# Patient Record
Sex: Female | Born: 1960 | Race: White | Hispanic: No | Marital: Married | Smoking: Never smoker
Health system: Southern US, Community
[De-identification: ages and names within clinical notes are randomized; demographics above are authoritative.]

## PROBLEM LIST (undated history)

## (undated) DIAGNOSIS — D649 Anemia, unspecified: Secondary | ICD-10-CM

## (undated) DIAGNOSIS — H04129 Dry eye syndrome of unspecified lacrimal gland: Secondary | ICD-10-CM

## (undated) DIAGNOSIS — G4730 Sleep apnea, unspecified: Secondary | ICD-10-CM

## (undated) DIAGNOSIS — D631 Anemia in chronic kidney disease: Secondary | ICD-10-CM

## (undated) DIAGNOSIS — E109 Type 1 diabetes mellitus without complications: Secondary | ICD-10-CM

## (undated) DIAGNOSIS — K5190 Ulcerative colitis, unspecified, without complications: Secondary | ICD-10-CM

## (undated) DIAGNOSIS — K760 Fatty (change of) liver, not elsewhere classified: Secondary | ICD-10-CM

## (undated) DIAGNOSIS — I10 Essential (primary) hypertension: Secondary | ICD-10-CM

## (undated) DIAGNOSIS — C50919 Malignant neoplasm of unspecified site of unspecified female breast: Secondary | ICD-10-CM

## (undated) DIAGNOSIS — L509 Urticaria, unspecified: Secondary | ICD-10-CM

## (undated) DIAGNOSIS — E785 Hyperlipidemia, unspecified: Secondary | ICD-10-CM

## (undated) HISTORY — PX: OTHER SURGICAL HISTORY: SHX169

## (undated) HISTORY — DX: Urticaria, unspecified: L50.9

## (undated) HISTORY — PX: CARPAL TUNNEL RELEASE: SHX101

## (undated) HISTORY — DX: Essential (primary) hypertension: I10

## (undated) HISTORY — DX: Sleep apnea, unspecified: G47.30

## (undated) HISTORY — DX: Anemia, unspecified: D64.9

## (undated) HISTORY — DX: Type 1 diabetes mellitus without complications: E10.9

## (undated) HISTORY — DX: Ulcerative colitis, unspecified, without complications: K51.90

## (undated) HISTORY — DX: Fatty (change of) liver, not elsewhere classified: K76.0

## (undated) HISTORY — DX: Dry eye syndrome of unspecified lacrimal gland: H04.129

## (undated) HISTORY — PX: APPENDECTOMY: SHX54

## (undated) HISTORY — DX: Malignant neoplasm of unspecified site of unspecified female breast: C50.919

## (undated) HISTORY — DX: Anemia in chronic kidney disease: D63.1

## (undated) HISTORY — DX: Hyperlipidemia, unspecified: E78.5

---
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
## 1991-09-02 HISTORY — PX: CHOLECYSTECTOMY: SHX55

## 1994-02-10 HISTORY — PX: NASAL SINUS SURGERY: SHX719

## 2004-09-23 ENCOUNTER — Encounter: Admission: RE | Admit: 2004-09-23 | Discharge: 2004-09-23 | Payer: Self-pay | Admitting: Gastroenterology

## 2004-10-03 ENCOUNTER — Ambulatory Visit (HOSPITAL_COMMUNITY): Admission: RE | Admit: 2004-10-03 | Discharge: 2004-10-03 | Payer: Self-pay | Admitting: Gastroenterology

## 2004-10-03 ENCOUNTER — Encounter (INDEPENDENT_AMBULATORY_CARE_PROVIDER_SITE_OTHER): Payer: Self-pay | Admitting: Specialist

## 2005-09-01 HISTORY — PX: OTHER SURGICAL HISTORY: SHX169

## 2005-12-08 HISTORY — PX: PROTOCOLECTOMY: SHX1034

## 2005-12-22 ENCOUNTER — Inpatient Hospital Stay (HOSPITAL_COMMUNITY): Admission: EM | Admit: 2005-12-22 | Discharge: 2005-12-25 | Payer: Self-pay | Admitting: Emergency Medicine

## 2006-06-17 HISTORY — PX: ILEOSTOMY: SHX1783

## 2007-02-03 ENCOUNTER — Ambulatory Visit: Payer: Self-pay | Admitting: Endocrinology

## 2007-02-05 ENCOUNTER — Ambulatory Visit: Payer: Self-pay | Admitting: Endocrinology

## 2007-02-11 ENCOUNTER — Ambulatory Visit: Payer: Self-pay | Admitting: Endocrinology

## 2007-02-24 ENCOUNTER — Ambulatory Visit: Payer: Self-pay | Admitting: Endocrinology

## 2007-03-17 ENCOUNTER — Ambulatory Visit: Payer: Self-pay | Admitting: Endocrinology

## 2007-04-14 ENCOUNTER — Encounter: Payer: Self-pay | Admitting: Endocrinology

## 2007-04-14 DIAGNOSIS — I10 Essential (primary) hypertension: Secondary | ICD-10-CM | POA: Insufficient documentation

## 2007-04-15 ENCOUNTER — Ambulatory Visit: Payer: Self-pay | Admitting: Endocrinology

## 2007-04-15 LAB — CONVERTED CEMR LAB: Hgb A1c MFr Bld: 7.3 % — ABNORMAL HIGH (ref 4.6–6.0)

## 2007-06-16 ENCOUNTER — Encounter: Payer: Self-pay | Admitting: Endocrinology

## 2007-06-16 ENCOUNTER — Ambulatory Visit: Payer: Self-pay | Admitting: Endocrinology

## 2007-06-29 ENCOUNTER — Encounter: Payer: Self-pay | Admitting: Endocrinology

## 2007-07-02 ENCOUNTER — Ambulatory Visit: Payer: Self-pay | Admitting: Endocrinology

## 2007-07-06 ENCOUNTER — Telehealth: Payer: Self-pay | Admitting: Endocrinology

## 2007-07-08 ENCOUNTER — Telehealth (INDEPENDENT_AMBULATORY_CARE_PROVIDER_SITE_OTHER): Payer: Self-pay | Admitting: *Deleted

## 2007-07-16 ENCOUNTER — Ambulatory Visit: Payer: Self-pay | Admitting: Endocrinology

## 2007-08-12 ENCOUNTER — Encounter: Payer: Self-pay | Admitting: Endocrinology

## 2007-09-06 ENCOUNTER — Ambulatory Visit: Payer: Self-pay | Admitting: Endocrinology

## 2007-09-06 LAB — CONVERTED CEMR LAB: Hgb A1c MFr Bld: 7.6 % — ABNORMAL HIGH (ref 4.6–6.0)

## 2007-09-08 ENCOUNTER — Encounter: Payer: Self-pay | Admitting: Endocrinology

## 2007-10-27 ENCOUNTER — Ambulatory Visit: Payer: Self-pay | Admitting: Endocrinology

## 2007-12-06 ENCOUNTER — Ambulatory Visit: Payer: Self-pay | Admitting: Endocrinology

## 2007-12-20 ENCOUNTER — Encounter: Payer: Self-pay | Admitting: Endocrinology

## 2008-01-11 ENCOUNTER — Encounter: Payer: Self-pay | Admitting: Endocrinology

## 2008-03-06 ENCOUNTER — Ambulatory Visit: Payer: Self-pay | Admitting: Endocrinology

## 2008-03-06 DIAGNOSIS — K7689 Other specified diseases of liver: Secondary | ICD-10-CM | POA: Insufficient documentation

## 2008-03-06 DIAGNOSIS — K5190 Ulcerative colitis, unspecified, without complications: Secondary | ICD-10-CM | POA: Insufficient documentation

## 2008-03-21 ENCOUNTER — Ambulatory Visit: Payer: Self-pay | Admitting: Endocrinology

## 2008-04-26 ENCOUNTER — Ambulatory Visit: Payer: Self-pay | Admitting: Endocrinology

## 2008-05-23 ENCOUNTER — Encounter: Payer: Self-pay | Admitting: Endocrinology

## 2008-08-01 ENCOUNTER — Ambulatory Visit: Payer: Self-pay | Admitting: Endocrinology

## 2008-08-01 LAB — CONVERTED CEMR LAB: Hgb A1c MFr Bld: 7.6 % — ABNORMAL HIGH (ref 4.6–6.0)

## 2008-09-25 ENCOUNTER — Ambulatory Visit: Payer: Self-pay | Admitting: Endocrinology

## 2008-11-16 ENCOUNTER — Encounter: Payer: Self-pay | Admitting: Endocrinology

## 2008-11-21 ENCOUNTER — Ambulatory Visit: Payer: Self-pay | Admitting: Endocrinology

## 2008-11-21 DIAGNOSIS — N259 Disorder resulting from impaired renal tubular function, unspecified: Secondary | ICD-10-CM | POA: Insufficient documentation

## 2008-11-21 DIAGNOSIS — E785 Hyperlipidemia, unspecified: Secondary | ICD-10-CM | POA: Insufficient documentation

## 2008-12-18 ENCOUNTER — Encounter: Payer: Self-pay | Admitting: Endocrinology

## 2008-12-20 ENCOUNTER — Telehealth: Payer: Self-pay | Admitting: Endocrinology

## 2009-01-02 ENCOUNTER — Ambulatory Visit: Payer: Self-pay | Admitting: Endocrinology

## 2009-01-02 DIAGNOSIS — R209 Unspecified disturbances of skin sensation: Secondary | ICD-10-CM | POA: Insufficient documentation

## 2009-01-02 LAB — CONVERTED CEMR LAB: Vitamin B-12: 489 pg/mL (ref 211–911)

## 2009-02-19 ENCOUNTER — Ambulatory Visit: Payer: Self-pay | Admitting: Endocrinology

## 2009-04-10 ENCOUNTER — Ambulatory Visit: Payer: Self-pay | Admitting: Endocrinology

## 2009-04-10 DIAGNOSIS — R809 Proteinuria, unspecified: Secondary | ICD-10-CM | POA: Insufficient documentation

## 2009-04-10 LAB — CONVERTED CEMR LAB: Hgb A1c MFr Bld: 7.8 % — ABNORMAL HIGH (ref 4.6–6.5)

## 2009-06-12 ENCOUNTER — Encounter: Payer: Self-pay | Admitting: Endocrinology

## 2009-07-10 ENCOUNTER — Ambulatory Visit: Payer: Self-pay | Admitting: Endocrinology

## 2009-07-10 LAB — CONVERTED CEMR LAB: Hgb A1c MFr Bld: 7.9 % — ABNORMAL HIGH (ref 4.6–6.5)

## 2009-07-25 ENCOUNTER — Telehealth (INDEPENDENT_AMBULATORY_CARE_PROVIDER_SITE_OTHER): Payer: Self-pay | Admitting: *Deleted

## 2009-08-01 ENCOUNTER — Encounter: Payer: Self-pay | Admitting: Endocrinology

## 2009-08-13 ENCOUNTER — Telehealth: Payer: Self-pay | Admitting: Endocrinology

## 2009-10-09 ENCOUNTER — Ambulatory Visit: Payer: Self-pay | Admitting: Endocrinology

## 2009-10-09 LAB — CONVERTED CEMR LAB: Hgb A1c MFr Bld: 7.4 % — ABNORMAL HIGH (ref 4.6–6.5)

## 2010-01-07 ENCOUNTER — Ambulatory Visit: Payer: Self-pay | Admitting: Endocrinology

## 2010-01-07 LAB — CONVERTED CEMR LAB
Folate: >20 ng/mL
Hgb A1c MFr Bld: 8.1 % — ABNORMAL HIGH (ref 4.6–6.5)
Vitamin B-12: 337 pg/mL (ref 211–911)

## 2010-01-14 ENCOUNTER — Telehealth: Payer: Self-pay | Admitting: Endocrinology

## 2010-02-08 ENCOUNTER — Encounter: Payer: Self-pay | Admitting: Endocrinology

## 2010-04-05 ENCOUNTER — Telehealth: Payer: Self-pay | Admitting: Endocrinology

## 2010-04-09 ENCOUNTER — Ambulatory Visit: Payer: Self-pay | Admitting: Endocrinology

## 2010-05-16 ENCOUNTER — Encounter: Payer: Self-pay | Admitting: Endocrinology

## 2010-05-16 ENCOUNTER — Telehealth: Payer: Self-pay | Admitting: Endocrinology

## 2010-07-09 ENCOUNTER — Ambulatory Visit: Payer: Self-pay | Admitting: Endocrinology

## 2010-07-09 DIAGNOSIS — R05 Cough: Secondary | ICD-10-CM

## 2010-07-09 DIAGNOSIS — R059 Cough, unspecified: Secondary | ICD-10-CM | POA: Insufficient documentation

## 2010-07-18 ENCOUNTER — Encounter: Payer: Self-pay | Admitting: Endocrinology

## 2010-07-19 ENCOUNTER — Emergency Department (HOSPITAL_COMMUNITY): Admission: EM | Admit: 2010-07-19 | Discharge: 2010-07-19 | Payer: Self-pay | Admitting: Emergency Medicine

## 2010-08-04 ENCOUNTER — Encounter: Payer: Self-pay | Admitting: Endocrinology

## 2010-08-05 ENCOUNTER — Encounter: Payer: Self-pay | Admitting: Endocrinology

## 2010-09-26 ENCOUNTER — Ambulatory Visit
Admission: RE | Admit: 2010-09-26 | Discharge: 2010-09-26 | Payer: Self-pay | Source: Home / Self Care | Attending: Internal Medicine | Admitting: Internal Medicine

## 2010-09-26 ENCOUNTER — Encounter: Payer: Self-pay | Admitting: Internal Medicine

## 2010-10-01 NOTE — Assessment & Plan Note (Signed)
Summary: 3 MTH FU  STC   Vital Signs:  Patient profile:   50 year old female Height:      62 inches (157.48 cm) Weight:      159.13 pounds (72.33 kg) BMI:     29.21 O2 Sat:      97 % on Room air Temp:     97.5 degrees F (36.39 degrees C) oral Pulse rate:   84 / minute BP sitting:   132 / 84  (left arm) Cuff size:   large  Vitals Entered By: Rebeca Alert (October 09, 2009 10:09 AM)  O2 Flow:  Room air CC: 3 month follow up visit/aj   Primary Provider:  k little  CC:  3 month follow up visit/aj.  History of Present Illness: pt states she feels well in general.  she brings a record of her cbg's which i have reviewed today.  the vast majority are in the low to mid-100's, except high-200's at hs.  Current Medications (verified): 1)  Altace 2.5 Mg  Caps (Ramipril) .... Take 2 By Mouth Qd 2)  Multivitamins   Tabs (Multiple Vitamin) .... Take 1 By Mouth Qd 3)  Depo-Provera 150 Mg/ml Im Susp (Medroxyprogesterone Acetate) .... Take 1 Shot Q 3 Months 4)  Glucagen Hypokit 1 Mg  Solr (Glucagon Hcl (Rdna)) .... As Dir 5)  Humalog 100 Unit/ml  Soln (Insulin Lispro (Human)) .... For Use in Pump, Total 100 Units/day 6)  Cvs Iron 325 (65 Fe) Mg  Tabs (Ferrous Sulfate) .... Take 1 By Mouth Qd 7)  Adult Aspirin Ec Low Strength 81 Mg  Tbec (Aspirin) .... Take 1 By Mouth Qd 8)  Claritin 10 Mg  Tabs (Loratadine) .... Take 1 By Mouth Qd 9)  Zocor 20 Mg  Tabs (Simvastatin) .... Take 1 By Mouth Qd 10)  Lomotil 2.5-0.025 Mg  Tabs (Diphenoxylate-Atropine) .... Take 2 By Mouth Two Times A Day Qd 11)  Citalopram Hydrobromide 40 Mg Tabs (Citalopram Hydrobromide) .... Take 1 By Mouth Qd 12)  Fenofibrate Micronized 160 Mg Caps (Fenofibrate Micronized) .... Take 1 By Mouth Qd 13)  Fish Oil .... 2 Tab Two Times A Day  Allergies (verified): 1)  ! Sulfa 2)  ! Codeine 3)  ! Biaxin 4)  ! * Aspartame 5)  ! 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    Physical Exam  General:  normal appearance.   Skin:  insulin infusion sites at anterior abdomen are normal  Additional Exam:  Hemoglobin A1C       [H]  7.4 %    Impression & Recommendations:  Problem # 1:  DIABETES MELLITUS, TYPE I (ICD-250.01) needs increased rx  Medications Added to Medication List This Visit: 1)  Fish Oil  .... 2 tab two times a day  Other Orders: TLB-A1C / Hgb A1C (Glycohemoglobin) (83036-A1C) Est. Patient Level III (70623)  Patient Instructions: 1)  continue basal rate to 1 unit/hr, except for 6 units/hr, 3 am to 6 am 2)  continue correction bolus (which some people call "sensitivity," or "insulin sensitivity ratio," or just "isr") of 1 unit for each 25 by which your glucose exceeds 115. 3)  check your blood sugar 4 times a day.  before the 3  meals, and at bedtime.  also check if you have symptoms of your blood sugar being too high or too low.  please keep a record of the readings and bring it to your next appointment here.  please call us sooner if you are having low blood sugar episodes. 4)  return 3 mos. 5)  tests are being ordered for you today.  a few days after the test(s), please call [phone] to hear your test results.   6)  please continue to pursue the continuous glucose monitor. 7)  increase pre-meal boluses to 21-19-27 units.  Preventive Care Screening  Mammogram:    Date:  07/02/2009    Results:  historical

## 2010-10-01 NOTE — Assessment & Plan Note (Signed)
Summary: 3 MTH FU   STC   Vital Signs:  Patient profile:   50 year old female Height:      62 inches (157.48 cm) Weight:      160.75 pounds (73.07 kg) O2 Sat:      97 % on Room air Temp:     98.7 degrees F (37.06 degrees C) oral Pulse rate:   90 / minute BP sitting:   124 / 78  (left arm) Cuff size:   large  Vitals Entered By: Gardenia Phlegm RMA (Jan 07, 2010 10:32 AM)  O2 Flow:  Room air CC: 3 month follow up/ CF Is Patient Diabetic? Yes   Primary Provider:  k little  CC:  3 month follow up/ CF.  History of Present Illness: pt states she feels well in general.  she brings a record of her cbg's which i have reviewed today.  it is highest at hs, and lowest in am.  most are 150-250.  she takes a total of approx 150 units of humalog per day, via her pump.    Current Medications (verified): 1)  Altace 2.5 Mg  Caps (Ramipril) .... Take 2 By Mouth Qd 2)  Multivitamins   Tabs (Multiple Vitamin) .... Take 1 By Mouth Qd 3)  Depo-Provera 150 Mg/ml Im Susp (Medroxyprogesterone Acetate) .... Take 1 Shot Q 3 Months 4)  Glucagen Hypokit 1 Mg  Solr (Glucagon Hcl (Rdna)) .... As Dir 5)  Humalog 100 Unit/ml  Soln (Insulin Lispro (Human)) .... For Use in Pump, Total 100 Units/day 6)  Cvs Iron 325 (65 Fe) Mg  Tabs (Ferrous Sulfate) .... Take 1 By Mouth Qd 7)  Adult Aspirin Ec Low Strength 81 Mg  Tbec (Aspirin) .... Take 1 By Mouth Qd 8)  Claritin 10 Mg  Tabs (Loratadine) .... Take 1 By Mouth Qd 9)  Zocor 40 Mg  Tabs (Simvastatin) .... Take 1 By Mouth Qd 10)  Lomotil 2.5-0.025 Mg  Tabs (Diphenoxylate-Atropine) .... Take 2 By Mouth Two Times A Day Qd 11)  Citalopram Hydrobromide 40 Mg Tabs (Citalopram Hydrobromide) .... Take 1 By Mouth Qd 12)  Fenofibrate Micronized 160 Mg Caps (Fenofibrate Micronized) .... Take 1 By Mouth Qd 13)  Fish Oil .... 2 Tab Two Times A Day  Allergies (verified): 1)  ! Sulfa 2)  ! Codeine 3)  ! Biaxin 4)  ! * Aspartame 5)  ! 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    Physical Exam  General:  normal appearance.   Pulses:  dorsalis pedis intact bilat.   Extremities:  no deformity.  no ulcer on the feet.  feet are of normal color and temp.  no edema  Neurologic:  sensation is intact to touch on the feet.  Additional Exam:  Hemoglobin A1C       [H]  8.1 %    Impression & Recommendations:  Problem # 1:  DIABETES MELLITUS, TYPE I (ICD-250.01) needs increased rx  Medications Added to Medication List This Visit: 1)  Humalog 100 Unit/ml Soln (Insulin lispro (human)) .... For use in pump, total 150 units/day 2)  Zocor 40 Mg Tabs (simvastatin)  .... Take 1 by mouth qd  Other Orders: T-Parathyroid Hormone, Intact w/ Calcium (35465-68127) TLB-A1C /  Hgb A1C (Glycohemoglobin) (83036-A1C) TLB-B12 + Folate Pnl (09326_71245-Y09/XIP) Est. Patient Level III (38250)  Patient Instructions: 1)  continue basal rate to 1 unit/hr, except for 6 units/hr, 3 am to 6 am 2)  continue correction bolus (which some people call "sensitivity," or "insulin sensitivity ratio," or just "isr") of 1 unit for each 25 by which your glucose exceeds 115. 3)  return 3 mos. 4)  tests are being ordered for you today.  a few days after the test(s), please call [phone] to hear your test results.   5)  pending the test results, please increase pre-meal boluses to 21-19-30 units. 6)  (update: i left message on phone-tree:  increase supper bolus to 33 units). Prescriptions: HUMALOG 100 UNIT/ML  SOLN (INSULIN LISPRO (HUMAN)) for use in pump, total 150 units/day  #5 vials x 11   Entered and Authorized by:   Donavan Foil MD   Signed by:   Donavan Foil  MD on 01/07/2010   Method used:   Electronically to        [facility] (mail-order)             ,          Ph: [phone]       Fax: [phone]   RxID:   [id number]

## 2010-10-01 NOTE — Letter (Signed)
Summary: Review form/[employer]  Review form/[employer]   Imported By: Bubba Hales 07/23/2010 10:01:13  _____________________________________________________________________  External Attachment:    Type:   Image     Comment:   External Document

## 2010-10-01 NOTE — Progress Notes (Signed)
  Phone Note Call from Patient Call back at Home Phone [phone]   Caller: Patient Summary of Call: Pt left message about her diabetic supplies. Received faxes today from [facility] supply. Orders placed on MD's desk.  Pt request callback once orders are completed. Initial call taken by: Rebeca Alert MA,  May 16, 2010 3:06 PM  Follow-up for Phone Call        left message for pt to callback regarding frequency of testing and infusion sets Follow-up by: Rebeca Alert MA,  May 16, 2010 4:48 PM  Additional Follow-up for Phone Call Additional follow up Details #1::        pt states she is testing 6 x's daily and is changing her infusion sets daily because they hold 200 units and she using 150 units per day-pt's orders faxed to [facility] and sent to be scanned Additional Follow-up by: Rebeca Alert MA,  May 17, 2010 11:33 AM

## 2010-10-01 NOTE — Progress Notes (Signed)
Summary: Insulin?  Phone Note Call from Patient Call back at Home Phone [phone]   Caller: Patient [phone] Summary of Call: Pt called stating that she has been using Humolog pen (as a back up) and pen needles as well as Humolog 150u via pump for years and SAE is aware. Pt is requested refills of pen needles and it was denied because according to EMR and last OV pt should only use pump. Pt is very irate and is requesting refill of Humolog kwikpen (originally Rxd 02/08/2009) and pen needles. Please advise, pt is very angry. Initial call taken by: Crissie Sickles, CMA,  April 05, 2010 11:17 AM  Follow-up for Phone Call        sent Follow-up by: Donavan Foil MD,  April 05, 2010 11:20 AM    New/Updated Medications: HUMALOG KWIKPEN 100 UNIT/ML SOLN (INSULIN LISPRO (HUMAN)) as dir Prescriptions: HUMALOG KWIKPEN 100 UNIT/ML SOLN (INSULIN LISPRO (HUMAN)) as dir  #1 box x 11   Entered and Authorized by:   Donavan Foil MD   Signed by:   Donavan Foil MD on 04/05/2010   Method used:   Electronically to        [facility] [phone]* (retail)       [address]       [place], [place]  [address]       Ph: [phone]       Fax: [phone]   RxID:   [id number]

## 2010-10-01 NOTE — Assessment & Plan Note (Signed)
Summary: 3 MOS F/U #/CD   Vital Signs:  Patient profile:   50 year old female Height:      62 inches (157.48 cm) Weight:      164 pounds (74.55 kg) BMI:     30.10 O2 Sat:      96 % on Room air Temp:     98.6 degrees F (37.00 degrees C) oral Pulse rate:   88 / minute Pulse rhythm:   regular BP sitting:   110 / 72  (left arm) Cuff size:   regular  Vitals Entered By: Rebeca Alert CMA Deborra Medina) (July 09, 2010 9:24 AM)  O2 Flow:  Room air CC: 3 month F/U/aj Is Patient Diabetic? Yes   Primary Provider:  k little  CC:  3 month F/U/aj.  History of Present Illness: pt states 1 week of congestion in the bimaxillary area, and assoc headache.   she saw dr at [facility] last week, and was rx'ed amoxil and non-prescription rx.  sxs   she has wheezing.  no cbg record, but states cbg's were well-controlled until her current illness.  it is highest in the middle of the night.     Current Medications (verified): 1)  Altace 2.5 Mg  Caps (Ramipril) .... Take 2 By Mouth Qd 2)  Multivitamins   Tabs (Multiple Vitamin) .... Take 1 By Mouth Qd 3)  Depo-Provera 150 Mg/ml Im Susp (Medroxyprogesterone Acetate) .... Take 1 Shot Q 3 Months 4)  Glucagen Hypokit 1 Mg  Solr (Glucagon Hcl (Rdna)) .... As Dir 5)  Humalog 100 Unit/ml  Soln (Insulin Lispro (Human)) .... For Use in Pump, Total 150 Units/day 6)  Cvs Iron 325 (65 Fe) Mg  Tabs (Ferrous Sulfate) .... Take 1 By Mouth Qd 7)  Adult Aspirin Ec Low Strength 81 Mg  Tbec (Aspirin) .... Take 1 By Mouth Qd 8)  Claritin 10 Mg  Tabs (Loratadine) .... Take 1 By Mouth Qd 9)  Citalopram Hydrobromide 40 Mg Tabs (Citalopram Hydrobromide) .... Take 1 By Mouth Qd 10)  Fenofibrate Micronized 160 Mg Caps (Fenofibrate Micronized) .... Take 1 By Mouth Qd 11)  Fish Oil .... 2 Tab Two Times A Day 12)  Humalog Kwikpen 100 Unit/ml Soln (Insulin Lispro (Human)) .... As Dir 13)  Bd Pen Needle Mini U/f 31g X 5 Mm Misc (Insulin Pen Needle) .... Use As Directed 14)  Lipitor 40  Mg Tabs (Atorvastatin Calcium) .Marland Kitchen.. 1 By Mouth Once Daily  Allergies: 1)  ! Sulfa 2)  ! Codeine 3)  ! Biaxin 4)  ! * Aspartame 5)  ! 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         she has "pressure," in both ears.   denies hypoglycemia  Physical Exam  General:  normal appearance.   Head:  head: no deformity eyes: no periorbital swelling, no proptosis external nose and ears are normal mouth: no lesion seen Neck:  Supple without thyroid enlargement or tenderness.  Lungs:  Clear to auscultation bilaterally. Normal respiratory effort.  Additional Exam:   Hemoglobin A1C       [H]  8.0 %    Impression & Recommendations:  Problem # 1:  DIABETES MELLITUS, TYPE I (ICD-250.01) needs increased rx  Problem # 2:  uri persistent  Medications Added to Medication List This Visit: 1)  Lipitor 40 Mg Tabs (Atorvastatin calcium) .Marland Kitchen.. 1 by mouth once daily 2)  Levofloxacin 500 Mg Tabs (Levofloxacin) .Marland Kitchen.. 1 tab once daily  Other Orders: T-2 View CXR (42876OT) Diabetic Clinic Referral (Diabetic) TLB-A1C / Hgb A1C (Glycohemoglobin) (83036-A1C) Est. Patient Level IV (15726)  Patient Instructions: 1)  chest x-ray, and blood test are being ordered for you today.  please call [phone] to hear your blood test, and x-ray results. 2)  continue basal rate to 1 unit/hr, except for 6 units/hr, 3 am to 6 am 3)  continue correction bolus (which some people call "sensitivity," or "insulin sensitivity ratio," or just "isr") of 1 unit for each 25 by which your glucose exceeds 115. 4)  return 3 mos. 5)  tests are being ordered for you today.  a few days after  the test(s), please call [phone] to hear your test results.   6)  pending the test results, please increase pre-meal boluses to 24-22-35 units. 7)  you should again request the continuous glucose monitor, from your insurance company.  refer back to linda spagnola to consider this.  you will be called with a day and time for an appointment. 8)  take loratadine-d (non-prescription) as needed for congestion 9)  levaquin 500 mg once daily. 10)  (update: i left message on phone-tree: increse basal rate to 1.2 units/hr, except for the hrs noted above) Prescriptions: LEVOFLOXACIN 500 MG TABS (LEVOFLOXACIN) 1 tab once daily  #7 x 0   Entered and Authorized by:   Donavan Foil MD   Signed by:   Donavan Foil MD on 07/09/2010   Method used:   Electronically to        [facility] [phone]* (retail)       [address]       [place], [place]  [address]       Ph: [phone]       Fax: [phone]   RxID:   [id number]    Orders Added: 1)  T-2 View CXR [[id number]] 2)  Diabetic Clinic Referral [Diabetic] 3)  TLB-A1C / Hgb A1C (Glycohemoglobin) [83036-A1C] 4)  Est. Patient Level IV [50569]

## 2010-10-01 NOTE — Progress Notes (Signed)
  Phone Note Refill Request Message from:  Fax from Pharmacy on Jan 14, 2010 11:24 AM  Refills Requested: Medication #1:  HUMALOG 100 UNIT/ML  SOLN for use in pump   Dosage confirmed as above?Dosage Confirmed pt requests that RX goes to [facility] in [place].  Initial call taken by: Gardenia Phlegm RMA,  Jan 14, 2010 11:24 AM    Prescriptions: HUMALOG 100 UNIT/ML  SOLN (INSULIN LISPRO (HUMAN)) for use in pump, total 150 units/day  #5 vials x 11   Entered by:   Gardenia Phlegm RMA   Authorized by:   Donavan Foil MD   Signed by:   Gardenia Phlegm RMA on 01/14/2010   Method used:   Electronically to        [facility] [phone]* (retail)       [address]       [place], [place]  [address]       Ph: [phone]       Fax: [phone]   RxID:   [id number]

## 2010-10-01 NOTE — Letter (Signed)
Summary: Generic Letter  [facility]  [address] [place], [place] [address]   Phone: [phone]  Fax: [phone]    08/04/2010  [facility] [address] [place], [place]  [address]  To whom it may concern,  This is to request reimbursement for real-time continuous glucose monitoring.  Mrs. Rhonda Gutierrez does not have severe, recurrent hypoglycemia.  However, she does have a persistently elevated A1c and despite her pump, and frequent self-monitoring of her glucoses.  From her record of these glucoses, no pattern has emerged to explain the variability of her cbg's, which could be used to direct her insulin pump therapy.  It is the recommendation of the Endocrine Society, and other organizations, that the next therapeutic step is real-time continuous glucose monitoring.  In making this recommendation, organizations cite 2008 and 2010 articles in the [place] of Medicine, about the results of the JDRF-CGM Study Group, and the STAR 3 Study Group, respectively.     Sincerely,   Renato Shin MD

## 2010-10-01 NOTE — Letter (Signed)
Summary: CMN for [employer] [facility] [employer]/[employer]  CMN for [employer]   Imported By: Phillis Knack 05/21/2010 07:25:46  _____________________________________________________________________  External Attachment:    Type:   Image     Comment:   External Document

## 2010-10-01 NOTE — Letter (Signed)
Summary: [facility]   Imported By: Phillis Knack 02/27/2010 09:40:31  _____________________________________________________________________  External Attachment:    Type:   Image     Comment:   External Document

## 2010-10-01 NOTE — Assessment & Plan Note (Signed)
Summary: 3 MTH FU---STC   Vital Signs:  Patient profile:   50 year old female Height:      62 inches (157.48 cm) Weight:      161.38 pounds (73.35 kg) BMI:     29.62 O2 Sat:      98 % on Room air Temp:     97.5 degrees F (36.39 degrees C) Pulse rate:   88 / minute BP sitting:   130 / 72  (left arm) Cuff size:   regular  Vitals Entered By: Rebeca Alert MA (April 09, 2010 9:52 AM)  O2 Flow:  Room air CC: 3 mo F/U/pt is no longer taking Zocor or Lomotil/aj Is Patient Diabetic? Yes   Primary Provider:  k little  CC:  3 mo F/U/pt is no longer taking Zocor or Lomotil/aj.  History of Present Illness: pt states she feels well in general.  she brings a record of her cbg's which i have reviewed today.  it varies from 140-250, with no trend throughout the day, except lowest in am.  she says she has been aggressive doing "correction boluses" in an attempt to get a1c lower.    Current Medications (verified): 1)  Altace 2.5 Mg  Caps (Ramipril) .... Take 2 By Mouth Qd 2)  Multivitamins   Tabs (Multiple Vitamin) .... Take 1 By Mouth Qd 3)  Depo-Provera 150 Mg/ml Im Susp (Medroxyprogesterone Acetate) .... Take 1 Shot Q 3 Months 4)  Glucagen Hypokit 1 Mg  Solr (Glucagon Hcl (Rdna)) .... As Dir 5)  Humalog 100 Unit/ml  Soln (Insulin Lispro (Human)) .... For Use in Pump, Total 150 Units/day 6)  Cvs Iron 325 (65 Fe) Mg  Tabs (Ferrous Sulfate) .... Take 1 By Mouth Qd 7)  Adult Aspirin Ec Low Strength 81 Mg  Tbec (Aspirin) .... Take 1 By Mouth Qd 8)  Claritin 10 Mg  Tabs (Loratadine) .... Take 1 By Mouth Qd 9)  Zocor 40 Mg  Tabs (Simvastatin) .... Take 1 By Mouth Qd 10)  Lomotil 2.5-0.025 Mg  Tabs (Diphenoxylate-Atropine) .... Take 2 By Mouth Two Times A Day Qd 11)  Citalopram Hydrobromide 40 Mg Tabs (Citalopram Hydrobromide) .... Take 1 By Mouth Qd 12)  Fenofibrate Micronized 160 Mg Caps (Fenofibrate Micronized) .... Take 1 By Mouth Qd 13)  Fish Oil .... 2 Tab Two Times A Day 14)  Humalog  Kwikpen 100 Unit/ml Soln (Insulin Lispro (Human)) .... As Dir 15)  Bd Pen Needle Mini U/f 31g X 5 Mm Misc (Insulin Pen Needle) .... Use As Directed 16)  Lipitor 20 Mg Tabs (Atorvastatin Calcium) .Marland Kitchen.. 1 Tablet By Mouth Once Daily  Allergies (verified): 1)  ! Sulfa 2)  ! Codeine 3)  ! Biaxin 4)  ! * Aspartame 5)  ! 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    Physical Exam  General:  normal appearance.   Psych:  Alert and cooperative; normal mood and affect; normal attention span and concentration.   Additional Exam:   Hemoglobin A1C       [H]  8.4 %      Impression & Recommendations:  Problem # 1:  DIABETES MELLITUS, TYPE I (ICD-250.01) needs increased rx  Medications Added to Medication List This Visit: 1)  Lipitor 20 Mg Tabs (Atorvastatin calcium) .Marland Kitchen.. 1 tablet by mouth once daily  Other Orders: TLB-A1C / Hgb A1C (Glycohemoglobin) (83036-A1C)  Patient Instructions: 1)  continue basal rate to 1 unit/hr, except for 6 units/hr, 3 am to 6 am 2)  continue correction bolus (which some people call "sensitivity," or "insulin sensitivity ratio," or just "isr") of 1 unit for each 25 by which your glucose exceeds 115. 3)  return 3 mos. 4)  tests are being ordered for you today.  a few days after the test(s), please call [phone] to hear your test results.   5)  pending the test results, please increase pre-meal boluses to 23-21-34 units. 6)  (update: i left message on phone-tree:  increase pre-meal boluses to 24-22-35 units).

## 2010-10-01 NOTE — Letter (Signed)
Summary: CMN/[facility]  CMN/[facility]   Imported By: Bubba Hales 08/07/2010 09:27:55  _____________________________________________________________________  External Attachment:    Type:   Image     Comment:   External Document

## 2010-10-03 NOTE — Assessment & Plan Note (Signed)
Summary: CONSULT FOR COUGH//SH   Visit Type:  Initial Consult Copy to:  self referral Primary Provider/Referring Provider:  Dr. Rex Kras, family practice [phone], Dr. [phone],  Dr. Leonia Reader, Dr. Rosalene Billings,  Dr. Pershing Proud, Dr. Dahlstedt-urologist, Dr. Gabriel Rung, Dr Brayton El [phone] renal  CC:  Consult for Cough since early november. .  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 Chroic claritin intake for chronic sinus issue. C/o cough since oct/nov 2011. On 07/01/2010 developed sinus pain and next day increased sinus pain, headache, runny nose. Then on 07/03/2010 went to [facility] with 10 day amox, netti pot. Saw Dr Loanne Drilling for DM on 07/09/2010 but was not feeling better in terms of sinus and by this time had develoiped cough. CXR 07/09/2010 was clear (personally reviwed) and treated with levaquin for 7 days. But cough persisted - rated it as severe. Went to [facility] walk in on 07/14/2010 - started on tessalon perles, albuterol. Then on 07/19/2010 went to ER due to severe cough. Had EKG, CXR and neb treatment and reportedly normal. DC'ed on short course prednisone and antibiotic. Then on 07/29/2010 saw her ENT - still had cough, sinus pain and pressure. CT scan sinuses 07/31/2010 and noticed to have "Some stuff" and was recommended sinus surgery. Deferred sinus surgery due to cost. Followed again with ENT  on 08/08/2010 and this time recommended allergy testing - but could no afford and refused testing. Then on 08/21/2010 saw Dr. Sabra Heck at [facility] due to persistent symptoms. Given antibiotic NOS for 3 weeks (round#4). She now feels sinuses are better but cough is bad and persistent.  Currently, cough is dry half the time and half the time there is a 'yucky" mild mucus. oVerall cough has stabiized in severity. Worst in evenings after meals and can increase in supine posture. Acid reducer NOS (taking for several months)  helps cough at night. In fact since sept 2011 she feels acid sensaiton in mouthn.  She does still have some mild post nasal drainage although it has improved significantly. Denies ticklish sensation in throat. Of note she has been on ACE inhibitor for proteinuria since summer 2008. She is also on Fish Oil for trigylecerides since fall 2011.   Admits to associated wheezing, dyspnea, and chest tighhtness after coughing or exposure to cold.    Preventive Screening-Counseling & Management  Alcohol-Tobacco     Smoking Status: never  Current Medications (verified): 1)  Altace 2.5 Mg  Caps (Ramipril) .... Take 2 By Mouth Qd 2)  Multivitamins   Tabs (Multiple Vitamin) .... Take 1 By Mouth Qd 3)  Depo-Provera 150 Mg/ml Im Susp (Medroxyprogesterone Acetate) .... Take 1 Shot Q 3 Months 4)  Glucagen Hypokit 1 Mg  Solr (Glucagon Hcl (Rdna)) .... As Dir 5)  Humalog 100 Unit/ml  Soln (Insulin Lispro (Human)) .... For Use in Pump, Total 150 Units/day 6)  Cvs Iron 325 (65 Fe) Mg  Tabs (Ferrous Sulfate) .... Take 1 By Mouth Qd 7)  Adult Aspirin Ec Low Strength 81 Mg  Tbec (Aspirin) .... Take 1 By Mouth Qd 8)  Claritin 10 Mg  Tabs (Loratadine) .... Take 1 By Mouth Qd 9)  Citalopram Hydrobromide 40 Mg Tabs (Citalopram Hydrobromide) .... Take 1 By Mouth Qd 10)  Fenofibrate 160 Mg Tabs (Fenofibrate) .... Take 1 Tablet By Mouth Once A Day 11)  Fish Oil 1000 Mg Caps (Omega-3 Fatty Acids) .... Take 1 Tablet By Mouth Three Times A Day 12)  Humalog  Kwikpen 100 Unit/ml Soln (Insulin Lispro (Human)) .... As Dir 13)  Bd Pen Needle Mini U/f 31g X 5 Mm Misc (Insulin Pen Needle) .... Use As Directed 14)  Lipitor 40 Mg Tabs (Atorvastatin Calcium) .Marland Kitchen.. 1 By Mouth Once Daily  Allergies (verified): 1)  ! Sulfa 2)  ! Codeine 3)  ! Biaxin 4)  ! * Aspartame 5)  ! 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  She believes that when she returns to work with much more activity, her glucoses will come down some more.  Married ETOH-3 drinks per week   Review of Systems       The patient complains of shortness of breath with activity, productive cough, chest pain, sore throat, headaches, nasal congestion/difficulty breathing through nose, and depression.  The patient denies shortness of breath at rest, non-productive cough, coughing up blood, irregular heartbeats, acid heartburn, indigestion, loss of appetite, weight change, abdominal pain, difficulty swallowing, tooth/dental problems, sneezing, itching, ear ache, anxiety, hand/feet swelling, joint stiffness or pain, rash, change in color of mucus, and fever.    Vital Signs:  Patient profile:   50 year old female Height:      62 inches Weight:      166.13 pounds BMI:     30.50 O2 Sat:      97 % on Room air Temp:     98.2 degrees F oral Pulse rate:   98 / minute BP sitting:   130 / 88  (right arm) Cuff size:   regular  Vitals Entered By: Irving Bing CMA (September 26, 2010 9:04 AM)  O2 Flow:  Room air CC: Consult for Cough since early november.  Comments Medications reviewed with patient Gering Bing CMA  September 26, 2010 9:07 AM Daytime phone number verified with patient.    Physical Exam  General:  well developed, well nourished, in no acute distressobese.   Head:  normocephalic and atraumatic Eyes:  PERRLA/EOM intact; conjunctiva and  sclera clear Ears:  TMs intact and clear with normal canals Nose:  no deformity, discharge, inflammation, or lesions Mouth:  no deformity or lesions Neck:  no masses, thyromegaly, or abnormal cervical nodes Chest Wall:  no deformities noted Lungs:  clear bilaterally to auscultation and percussion Heart:  regular rate and rhythm, S1, S2 without murmurs, rubs, gallops, or clicks Abdomen:  bowel sounds positive; abdomen soft and non-tender without masses, or organomegaly  scar + Msk:  no deformity or scoliosis noted with normal posture Pulses:  pulses normal Extremities:  no clubbing, cyanosis, edema, or deformity noted Neurologic:  CN II-XII grossly intact with normal reflexes, coordination, muscle strength and tone Skin:  intact without lesions or rashes Cervical Nodes:  no significant adenopathy Axillary Nodes:  no significant adenopathy Psych:  alert and cooperative; normal mood and affect; normal attention span and concentration   CXR  Procedure date:  07/09/2010  Findings:      clear. Independently reviwed  Impression & Recommendations:  Problem # 1:  COUGH (ICD-786.2) Assessment New  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, acid reflux and ACE inhibitor issues #For sinus  - take netti pot saline wash  - continue claritin #FOr ACID REFLUX  - quit all cola, coffee, spices and fried food and alcohol for 1 month  - start zegerid 1 cap daily on empty stomach  - take diet sheet #FOR POSSIBLE ASTHMA LIKE SYMPTOMS  - stop altace  - take benicar 4m by mouth daily ( I have informed Dr. SJari Sportsmannurse LJeani Hawkingof changes) #FOLLLOWUP  - 4-5 weeks or sooner if worse  - real important you follow instructions strictly  - inform your renal doctor and lipid doctor about changes  Orders: Consultation Level V (99245)  Problem # 2:  SHORTNESS OF BREATH (SOB) (ICD-786.05) Assessment: New  will reassess after above changes  Orders: Consultation Level  V ((93716  Medications Added to Medication List This Visit: 1)  Benicar 5 Mg Tabs (Olmesartan medoxomil) .... Take 1 tablet daily or 1/2 tablet two times a day using pill cutter 2)  Fenofibrate 160 Mg Tabs (Fenofibrate) .... Take 1 tablet by mouth once a day 3)  Zegerid Otc 20-1100 Mg Caps (Omeprazole-sodium bicarbonate) .... Take 1 cap daily on empty stomach 4)  Losartan Potassium 25 Mg Tabs (Losartan potassium) .... Take 1 tablet daily  Patient Instructions: 1)  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, acid reflux and ACE inhibitor issues 2)  #For sinus 3)   - take netti pot saline wash 4)   - continue claritin 5)  #FOr ACID REFLUX 6)   - quit all cola, coffee, spices and fried food and alcohol for 1 month 7)   - start zegerid 1 cap daily on empty stomach 8)   - take diet sheet 9)  #FOR POSSIBLE ASTHMA LIKE SYMPTOMS 10)   - stop altace 11)   - take losartan potassium 1 tablet daily 12)  #FOLLLOWUP 13)   - 4-5 weeks or sooner if worse 14)   - real important you follow instructions strictly 15)   - inform your renal doctor and lipid doctor about changes Prescriptions: LOSARTAN POTASSIUM 25 MG TABS (LOSARTAN POTASSIUM) take 1 tablet daily  #30 x 2   Entered and Authorized by:   MBrand MalesMD   Signed by:   MBrand MalesMD on 09/26/2010   Method used:   [phone] [facility]       [address]      [place]  [address]      Ph: [phone]      Fax: [phone]  RxID:   [id number] OTC 20-1100 MG CAPS (OMEPRAZOLE-SODIUM BICARBONATE) take 1 cap daily on empty stomach  #30 x 2   Entered and Authorized by:   MBrand MalesMD   Signed by:   MBrand MalesMD on 09/26/2010   Method used:   [facility]        [phone] ([facility]ail)       [address]      [place]   [address]      Ph: [phone]      Fax: [phone]  RxID:   [id number] 5  MG TABS (OLMESARTAN MEDOXOMIL) take 1 tablet daily or 1/2 tablet two times a day using  pill cutter  #30 x 2   Entered and Authorized by:   Brand Males MD   Signed by:   Brand Males MD on 09/26/2010   Method used:   Electronically to        [facility] [phone]* (retail)       [address]       [place], [place]  [address]       Ph: [phone]       Fax: [phone]   RxID:   [id number]     Appended Document: CONSULT FOR COUGH//SH send note to Dr. Brayton El  - renal, Dr. Sabra Heck - PMD and [facility]   Appended Document: CONSULT FOR COUGH//SH ov note faxed to all MDs listed above.

## 2010-10-03 NOTE — Progress Notes (Signed)
Summary: List of Physicians provided by patient  List of Physicians provided by patient   Imported By: Laural Benes 09/27/2010 15:33:54  _____________________________________________________________________  External Attachment:    Type:   Image     Comment:   External Document

## 2010-10-07 ENCOUNTER — Emergency Department (HOSPITAL_COMMUNITY)
Admission: EM | Admit: 2010-10-07 | Discharge: 2010-10-07 | Disposition: A | Discharge status: Home / Self Care | Payer: BC Managed Care – PPO | Attending: Emergency Medicine | Admitting: Emergency Medicine

## 2010-10-07 DIAGNOSIS — Z794 Long term (current) use of insulin: Secondary | ICD-10-CM | POA: Insufficient documentation

## 2010-10-07 DIAGNOSIS — Z79899 Other long term (current) drug therapy: Secondary | ICD-10-CM | POA: Insufficient documentation

## 2010-10-07 DIAGNOSIS — R0602 Shortness of breath: Secondary | ICD-10-CM | POA: Insufficient documentation

## 2010-10-07 DIAGNOSIS — R0989 Other specified symptoms and signs involving the circulatory and respiratory systems: Secondary | ICD-10-CM | POA: Insufficient documentation

## 2010-10-07 DIAGNOSIS — E119 Type 2 diabetes mellitus without complications: Secondary | ICD-10-CM | POA: Insufficient documentation

## 2010-10-07 DIAGNOSIS — R059 Cough, unspecified: Secondary | ICD-10-CM | POA: Insufficient documentation

## 2010-10-07 DIAGNOSIS — R05 Cough: Secondary | ICD-10-CM | POA: Insufficient documentation

## 2010-10-07 DIAGNOSIS — R0609 Other forms of dyspnea: Secondary | ICD-10-CM | POA: Insufficient documentation

## 2010-10-08 ENCOUNTER — Encounter: Payer: Self-pay | Admitting: Endocrinology

## 2010-10-08 ENCOUNTER — Encounter (INDEPENDENT_AMBULATORY_CARE_PROVIDER_SITE_OTHER): Payer: Self-pay | Admitting: *Deleted

## 2010-10-08 ENCOUNTER — Other Ambulatory Visit: Payer: Self-pay | Admitting: Endocrinology

## 2010-10-08 ENCOUNTER — Ambulatory Visit (INDEPENDENT_AMBULATORY_CARE_PROVIDER_SITE_OTHER): Payer: BC Managed Care – PPO | Admitting: Endocrinology

## 2010-10-08 ENCOUNTER — Other Ambulatory Visit: Payer: BC Managed Care – PPO

## 2010-10-08 DIAGNOSIS — R062 Wheezing: Secondary | ICD-10-CM

## 2010-10-08 DIAGNOSIS — E109 Type 1 diabetes mellitus without complications: Secondary | ICD-10-CM

## 2010-10-09 ENCOUNTER — Other Ambulatory Visit: Payer: BC Managed Care – PPO

## 2010-10-09 ENCOUNTER — Other Ambulatory Visit: Payer: Self-pay | Admitting: Adult Health

## 2010-10-09 ENCOUNTER — Encounter (INDEPENDENT_AMBULATORY_CARE_PROVIDER_SITE_OTHER): Payer: Self-pay | Admitting: *Deleted

## 2010-10-09 ENCOUNTER — Encounter: Payer: Self-pay | Admitting: Adult Health

## 2010-10-09 ENCOUNTER — Ambulatory Visit (INDEPENDENT_AMBULATORY_CARE_PROVIDER_SITE_OTHER): Payer: BC Managed Care – PPO | Admitting: Adult Health

## 2010-10-09 DIAGNOSIS — R062 Wheezing: Secondary | ICD-10-CM

## 2010-10-09 DIAGNOSIS — R059 Cough, unspecified: Secondary | ICD-10-CM

## 2010-10-09 DIAGNOSIS — R05 Cough: Secondary | ICD-10-CM

## 2010-10-09 DIAGNOSIS — R0602 Shortness of breath: Secondary | ICD-10-CM

## 2010-10-17 NOTE — Assessment & Plan Note (Signed)
Summary: 3 MTH FU STC   Vital Signs:  Patient profile:   50 year old female Height:      62 inches (157.48 cm) Weight:      164.19 pounds (74.63 kg) BMI:     30.14 O2 Sat:      97 % on Room air Temp:     99.0 degrees F (37.22 degrees C) oral Pulse rate:   94 / minute BP sitting:   122 / 78  (left arm) Cuff size:   regular  Vitals Entered By: Rebeca Alert CMA Deborra Medina) (October 08, 2010 9:34 AM)  O2 Flow:  Room air CC: 3 month F/U/aj Is Patient Diabetic? Yes   Referring Provider:  self referral Primary Provider:  Dr. Rex Kras, family practice [phone], Dr. [phone],  Dr. Leonia Reader, Dr. Rosalene Billings,  Dr. Pershing Proud, Dr. Dahlstedt-urologist, Dr. Gabriel Rung, Dr Brayton El [phone] renal  CC:  3 month F/U/aj.  History of Present Illness: pt states few mos of slight wheezing in the chest, and assoc sore throat.  no improvement with 4 courses of abx, bronchodilators, prednisone (nov, 2011 only, x 1) and tessalon.   no cbg record, but states cbg's are well-controlled.  insurance declined continuous glucose monitor.     Current Medications (verified): 1)  Multivitamins   Tabs (Multiple Vitamin) .... Take 1 By Mouth Qd 2)  Depo-Provera 150 Mg/ml Im Susp (Medroxyprogesterone Acetate) .... Take 1 Shot Q 3 Months 3)  Glucagen Hypokit 1 Mg  Solr (Glucagon Hcl (Rdna)) .... As Dir 4)  Humalog 100 Unit/ml  Soln (Insulin Lispro (Human)) .... For Use in Pump, Total 150 Units/day 5)  Cvs Iron 325 (65 Fe) Mg  Tabs (Ferrous Sulfate) .... Take 1 By Mouth Qd 6)  Adult Aspirin Ec Low Strength 81 Mg  Tbec (Aspirin) .... Take 1 By Mouth Qd 7)  Claritin 10 Mg  Tabs (Loratadine) .... Take 1 By Mouth Qd 8)  Citalopram Hydrobromide 40 Mg Tabs (Citalopram Hydrobromide) .... Take 1 By Mouth Qd 9)  Fenofibrate 160 Mg Tabs (Fenofibrate) .... Take 1 Tablet By Mouth Once A Day 10)  Humalog Kwikpen 100 Unit/ml Soln (Insulin Lispro (Human)) .... As Dir 11)  Bd Pen Needle Mini U/f 31g X 5 Mm  Misc (Insulin Pen Needle) .... Use As Directed 12)  Lipitor 40 Mg Tabs (Atorvastatin Calcium) .Marland Kitchen.. 1 By Mouth Once Daily 13)  Zegerid Otc 20-1100 Mg Caps (Omeprazole-Sodium Bicarbonate) .... Take 1 Cap Daily On Empty Stomach 14)  Losartan Potassium 25 Mg Tabs (Losartan Potassium) .... Take 1 Tablet Daily  Allergies (verified): 1)  ! Sulfa 2)  ! Codeine 3)  ! Biaxin 4)  ! * Aspartame 5)  ! 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         no hypoglycemia  Physical Exam  General:  Well developed, well nourished, in no acute distress.  Head:  head: no deformity eyes: no periorbital swelling, no proptosis external nose and ears are normal mouth: no lesion seen Lungs:  few insp and exp wheezes Additional Exam:  spirometry is normal   Hemoglobin A1C       [H]  8.3 %    Impression & Recommendations:  Problem # 1:  WHEEZING (ICD-786.07) normal spirometry  Problem # 2:  COUGH (ICD-786.2) prob due to persistent viral illness  Problem # 3:  DIABETES MELLITUS, TYPE I (ICD-250.01) needs increased rx  Medications Added to Medication List This Visit: 1)  Methylprednisolone (pak) 4 Mg Tabs (Methylprednisolone) .... As dir  Other Orders: Spirometry w/Graph (94010) TLB-A1C / Hgb A1C (Glycohemoglobin) (83036-A1C) Est. Patient Level IV (11173)  Patient Instructions: 1)  take methlyprednisolone "pack." 2)  a blood test is being ordered for you today.  please call [phone] to hear your blood test, and x-ray results. 3)  continue basal rate to 1.2 unit/hr, except for 6 units/hr, 3 am to 6 am 4)  continue correction bolus (which some people  call "sensitivity," or "insulin sensitivity ratio," or just "isr") of 1 unit for each 25 by which your glucose exceeds 115. 5)  return 3 mos.   6)  tests are being ordered for you today.  a few days after the test(s), please call [phone] to hear your test results.   7)  pending the test results, please continue pre-meal boluses of 24-22-35 units.   8)  (update: i left message on phone-tree:  call and tell us what time of day cbg is highest, so we can safley increase insulin.  please f/u with pulm). Prescriptions: METHYLPREDNISOLONE (PAK) 4 MG TABS (METHYLPREDNISOLONE) as dir  #1 pack x 0   Entered and Authorized by:   Donavan Foil MD   Signed by:   Donavan Foil MD on 10/08/2010   Method used:   Electronically to        [facility] # [id number]* (retail)       [address]       [place], [place]  [address]       Ph: [phone] or [phone]       Fax: [phone]   RxID:   [id number]    Orders Added: 1)  Spirometry w/Graph [94010] 2)  TLB-A1C / Hgb A1C (Glycohemoglobin) [83036-A1C] 3)  Est. Patient Level IV [37543]     Preventive Care Screening  Mammogram:    Date:  07/24/2010    Results:  normal

## 2010-10-17 NOTE — Assessment & Plan Note (Signed)
Summary: Acute NP office visit - dyspnea   Copy to:  self referral Primary Provider/Referring Provider:  Dr. Rex Kras, family practice [phone], Dr. [phone],  Dr. Leonia Reader, Dr. Rosalene Billings,  Dr. Pershing Proud, Dr. Dahlstedt-urologist, Dr. Gabriel Rung, Dr Brayton El [phone] renal  CC:  increased SOB especially w/ lying down, wheezing, prod cough with mily colored mucus, and pressure/tightness in chest mainly in the evenings x1week - went to ED 2.6.12 and received neb tx x3.  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 Chroic claritin intake for chronic sinus issue. C/o cough since oct/nov 2011. On 07/01/2010 developed sinus pain and next day increased sinus pain, headache, runny nose. Then on 07/03/2010 went to [facility] Rx with 10 day amox, netti pot. Saw Dr Loanne Drilling for DM on 07/09/2010 but was not feeling better in terms of sinus and by this time had develoiped cough. CXR 07/09/2010 was clear (personally reviwed) and treated with levaquin for 7 days. But cough persisted - rated it as severe. Went to [place] walk in on 07/14/2010 - started on tessalon perles, albuterol. Then on 07/19/2010 went to ER due to severe cough. Had EKG, CXR and neb treatment and reportedly normal. DC'ed on short course prednisone and antibiotic. Then on 07/29/2010 saw her ENT - still had cough, sinus pain and pressure. CT scan sinuses 07/31/2010 and noticed to have "Some stuff" and was recommended sinus surgery. Deferred sinus surgery due to cost. Followed again with ENT  on 08/08/2010 and this time recommended allergy testing - but could no afford and refused testing. Then on 08/21/2010 saw Dr. Sabra Heck at [place] due to persistent symptoms. Given antibiotic NOS for 3 weeks (round#4). She now feels sinuses are better but cough is bad and persistent.  Currently, cough is dry half the time and half the time there is a 'yucky" mild mucus. oVerall cough has  stabiized in severity. Worst in evenings after meals and can increase in supine posture. Acid reducer NOS (taking for several months) helps cough at night. In fact since sept 2011 she feels acid sensaiton in mouthn.  She does still have some mild post nasal drainage although it has improved significantly. Denies ticklish sensation in throat. Of note she has been on ACE inhibitor for proteinuria since summer 2008. She is also on Fish Oil for trigylecerides since fall 2011.   Admits to associated wheezing, dyspnea, and chest tighhtness after coughing or exposure to cold.   10/09/10--Presents for an acute office visit. Complains of increased SOB especially w/ lying down, wheezing, prod cough with mily colored mucus, pressure/tightness in chest mainly in the evenings x1week - went to ED 2.6.12 and received neb tx x3. Seen 2 weeks ago felt to have upper airway cough syndrome suspected multifactoral in nature from GERD , Rhnitis. ACE was stopped, PPI and claritin rx. She says she can not stop coughing -is not getting any sleep. Drainage in throat.Tickling sensation. Denies chest pain,  orthopnea, hemoptysis, fever, n/v/d, edema, headache.   Medications Prior to Update: 1)  Multivitamins   Tabs (Multiple Vitamin) .... Take 1 By Mouth Qd 2)  Depo-Provera 150 Mg/ml Im Susp (Medroxyprogesterone Acetate) .... Take 1 Shot Q 3 Months 3)  Glucagen Hypokit 1 Mg  Solr (Glucagon Hcl (Rdna)) .... As Dir 4)  Humalog 100 Unit/ml  Soln (Insulin Lispro (Human)) .... For Use in Pump, Total 150 Units/day 5)  Cvs Iron 325 (65 Fe) Mg  Tabs (Ferrous Sulfate) .... Take 1 By  Mouth Qd 6)  Adult Aspirin Ec Low Strength 81 Mg  Tbec (Aspirin) .... Take 1 By Mouth Qd 7)  Claritin 10 Mg  Tabs (Loratadine) .... Take 1 By Mouth Qd 8)  Citalopram Hydrobromide 40 Mg Tabs (Citalopram Hydrobromide) .... Take 1 By Mouth Qd 9)  Fenofibrate 160 Mg Tabs (Fenofibrate) .... Take 1 Tablet By Mouth Once A Day 10)  Humalog Kwikpen 100 Unit/ml Soln  (Insulin Lispro (Human)) .... As Dir 11)  Bd Pen Needle Mini U/f 31g X 5 Mm Misc (Insulin Pen Needle) .... Use As Directed 12)  Lipitor 40 Mg Tabs (Atorvastatin Calcium) .Marland Kitchen.. 1 By Mouth Once Daily 13)  Zegerid Otc 20-1100 Mg Caps (Omeprazole-Sodium Bicarbonate) .... Take 1 Cap Daily On Empty Stomach 14)  Losartan Potassium 25 Mg Tabs (Losartan Potassium) .... Take 1 Tablet Daily 15)  Methylprednisolone (Pak) 4 Mg Tabs (Methylprednisolone) .... As Dir  Current Medications (verified): 1)  Multivitamins   Tabs (Multiple Vitamin) .... Take 1 By Mouth Once Daily 2)  Depo-Provera 150 Mg/ml Im Susp (Medroxyprogesterone Acetate) .... Take 1 Shot Every 3 Months 3)  Glucagen Hypokit 1 Mg  Solr (Glucagon Hcl (Rdna)) .... As Dir 4)  Humalog 100 Unit/ml  Soln (Insulin Lispro (Human)) .... For Use in Pump, Total 150 Units/day 5)  Cvs Iron 325 (65 Fe) Mg  Tabs (Ferrous Sulfate) .... Take 1 By Mouth Once Daily 6)  Adult Aspirin Ec Low Strength 81 Mg  Tbec (Aspirin) .... Take 1 By Mouth Once Daily 7)  Claritin 10 Mg  Tabs (Loratadine) .... Take 1 By Mouth Once Daily 8)  Citalopram Hydrobromide 40 Mg Tabs (Citalopram Hydrobromide) .... Take 1 By Mouth Once Daily 9)  Fenofibrate 160 Mg Tabs (Fenofibrate) .... Take 1 Tablet By Mouth Once A Day 10)  Humalog Kwikpen 100 Unit/ml Soln (Insulin Lispro (Human)) .... As Dir 11)  Bd Pen Needle Mini U/f 31g X 5 Mm Misc (Insulin Pen Needle) .... Use As Directed 12)  Lipitor 40 Mg Tabs (Atorvastatin Calcium) .Marland Kitchen.. 1 By Mouth Once Daily 13)  Zegerid Otc 20-1100 Mg Caps (Omeprazole-Sodium Bicarbonate) .... Take 1 Cap Daily On Empty Stomach 14)  Losartan Potassium 25 Mg Tabs (Losartan Potassium) .... Take 1 Tablet Daily 15)  Methylprednisolone (Pak) 4 Mg Tabs (Methylprednisolone) .... As Dir  Allergies (verified): 1)  ! Sulfa 2)  ! Codeine 3)  ! Biaxin 4)  ! * Aspartame 5)  ! 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  She believes that when she returns to work with much more activity, her glucoses will come down some more.  Married ETOH-3 drinks per week   Risk Factors: Smoking Status: never (09/26/2010)  Review of Systems      See HPI  Vital Signs:  Patient profile:   50 year old female Height:      62 inches Weight:      164.25 pounds BMI:  30.15 O2 Sat:      97 % on Room air Temp:     98.9 degrees F oral Pulse rate:   99 / minute BP sitting:   126 / 82  (left arm) Cuff size:   regular  Vitals Entered By: Parke Poisson CNA/MA (October 09, 2010 9:28 AM)  O2 Flow:  Room air CC: increased SOB especially w/ lying down, wheezing, prod cough with mily colored mucus, pressure/tightness in chest mainly in the evenings x1week - went to ED 2.6.12 and received neb tx x3 Is Patient Diabetic? No Comments Medications reviewed with patient Daytime contact number verified with patient. Parke Poisson CNA/MA  October 09, 2010 9:27 AM    Physical Exam  Additional Exam:  GEN: A/Ox3; pleasant , NAD HEENT:  [place]/AT, , EACs-clear, TMs-wnl, NOSE-clear drainage , THROAT-clear NECK:  Supple w/ fair ROM; no JVD; normal carotid impulses w/o bruits; no thyromegaly or nodules palpated; no lymphadenopathy. RESP  Coarse BS w/ few exp wheezes noted.  CARD:   RRR, no m/r/g   GI:   Soft & nt; nml bowel sounds; no organomegaly or masses detected. [place]: Warm bil,  no calf tenderness edema, clubbing, pulses intact Neuro: intact with no focal deficits noted.    Impression & Recommendations:  Problem # 1:  COUGH (ICD-786.2) Upper airway cough syndrome in very complicated pt w/ multple comorbities including type 1 DM -on insulin pump Advised on cyclical cough regimen will give booster steroid shot  maximize meds  Plan: Continue Zegerid once daily  Add Pepcid 94m at bedtime  GERD diet  Continue on Claritin once daily  Add Chlortrimeton 469m2 by mouth at bedtime  Finish Medrol pack. Delsym 2 tsp two times a day  Tessalon three times a day  Hydromet 1-2 tsp every 4hr as needed cough-,may make you sleepy. Use sugarless candy, ice chips, water to avoid throat clearing and cough--NO MINTS .follow up Dr. RaChase Callern 2 weeks.  Please contact office for sooner follow up if symptoms do not improve or worsen  Orders: TLB-Beta Strep Screen (87880-STRSCR) Est. Patient Level IV ([phone] Medications Added to Medication List This Visit: 1)  Multivitamins Tabs (Multiple vitamin) .... Take 1 by mouth once daily 2)  Depo-provera 150 Mg/ml Im Susp (Medroxyprogesterone acetate) .... Take 1 shot every 3 months 3)  Cvs Iron 325 (65 Fe) Mg Tabs (Ferrous sulfate) .... Take 1 by mouth once daily 4)  Adult Aspirin Ec Low Strength 81 Mg Tbec (Aspirin) .... Take 1 by mouth once daily 5)  Claritin 10 Mg Tabs (Loratadine) .... Take 1 by mouth once daily 6)  Citalopram Hydrobromide 40 Mg Tabs (Citalopram hydrobromide) .... Take 1 by mouth once daily 7)  Tessalon 200 Mg Caps (Benzonatate) ...Marland Kitchen 1 by mouth three times a day as needed cough 8)  Hydromet 5-1.5 Mg/39m60myrp (Hydrocodone-homatropine) ....Marland Kitchen1-2 tsp every 4-6 hrs as needed cough  Other Orders: Depo- Medrol 80m3m1030) Depo- Medrol 80mg51m040) Admin of Therapeutic Inj  intramuscular or subcutaneous  (9637(22025tient Instructions: 1)  Continue Zegerid once daily  2)  Add Pepcid 20mg 32medtime  3)  GERD diet  4)  Continue on Claritin once daily  5)  Add Chlortrimeton 4mg 2 38mmouth at bedtime  6)  Finish Medrol pack. 7)  Delsym 2 tsp two times a day  8)  Tessalon three times a day  9)  Hydromet 1-2 tsp every 4hr as needed cough-,may make you sleepy. 10)  Use sugarless candy, ice chips, water to avoid throat clearing and cough--NO MINTS 11)  .follow up Dr. Chase Caller in 2 weeks.  12)  Please contact office for sooner follow up if symptoms do not improve or worsen  Prescriptions: HYDROMET 5-1.5 MG/5ML SYRP (HYDROCODONE-HOMATROPINE) 1-2 tsp every 4-6 hrs as needed cough  #8 oz x 0   Entered and Authorized by:   Rexene Edison NP   Signed by:   Tammy Parrett NP on 10/09/2010   Method used:   Print then Give to Patient   RxID:   [id number] TESSALON 200 MG CAPS (BENZONATATE) 1 by mouth three times a day as needed cough  #45 x 1   Entered and Authorized by:   Rexene Edison NP   Signed by:   Tammy Parrett NP on 10/09/2010   Method used:   Electronically to        [facility]. # [id number]* (retail)       [address]       [place], [place]  [address]       Ph: [phone] or [phone]       Fax: [phone]   RxID:   [id number]      Medication Administration  Injection # 1:    Medication: Depo- Medrol 21m    Diagnosis: WHEEZING (ICD-786.07)    Route: IM    Site: RUOQ gluteus    Exp Date: 03/2013    Lot #: [id number]    Mfr: Pharmacia    Patient tolerated injection without complications    Given by: MCharma Igo(October 09, 2010 10:36 AM)  Injection # 2:    Medication: Depo- Medrol 88m   Diagnosis: WHEEZING (ICD-786.07)    Route: IV    Site: RUOQ gluteus    Exp Date: 03/2013    Lot #: [id number]    Mfr: Pharmacia    Patient tolerated injection without complications    Given by: MiCharma IgoFebruary  8, 2012 10:36 AM)  Orders  Added: 1)  Depo- Medrol 4053mJ1030] 2)  Depo- Medrol 30m58m1040] 3)  Admin of Therapeutic Inj  intramuscular or subcutaneous [96372] 4)  TLB-Beta Strep Screen [87880-STRSCR] 5)  Est. Patient Level IV [[id number]]

## 2010-10-25 ENCOUNTER — Encounter: Payer: Self-pay | Admitting: Internal Medicine

## 2010-10-25 ENCOUNTER — Ambulatory Visit (INDEPENDENT_AMBULATORY_CARE_PROVIDER_SITE_OTHER): Payer: BC Managed Care – PPO | Admitting: Internal Medicine

## 2010-10-25 DIAGNOSIS — R059 Cough, unspecified: Secondary | ICD-10-CM

## 2010-10-25 DIAGNOSIS — R05 Cough: Secondary | ICD-10-CM

## 2010-10-29 NOTE — Assessment & Plan Note (Signed)
Summary: 4 week return/mhh   Visit Type:  Follow-up Copy to:  self referral Primary Provider/Referring Provider:  Dr. Rex Kras, family practice [phone], Dr. [phone],  Dr. Leonia Reader, Dr. Rosalene Billings,  Dr. Pershing Proud, Dr. Dahlstedt-urologist, Dr. Gabriel Rung, Dr Brayton El [phone] renal  CC:  Pt here for 4 week follow-up. Pt c/o still having cough and PND and sore throat. Marland Kitchen  History of Present Illness: October 25, 2010  Cough, wheezing, sore throat all persists. Very little better per her. States she is compliant with extensive sinus, anti-gerd measures. IS now on ARB losartan instead of ACE inhibitor. STopped fish poil too. Saw PMD 10/08/2010 - spiro shows mixed obstruction - restriction.  Took 1 week pred. Saw NP here 10/09/2010 - lot of meds added for cyclical cough. Still not much better. More wheezing today. Denies fever, sputum.   .  Preventive Screening-Counseling & Management  Alcohol-Tobacco     Smoking Status: never  Current Medications (verified): 1)  Multivitamins   Tabs (Multiple Vitamin) .... Take 1 By Mouth Once Daily 2)  Depo-Provera 150 Mg/ml Im Susp (Medroxyprogesterone Acetate) .... Take 1 Shot Every 3 Months 3)  Glucagen Hypokit 1 Mg  Solr (Glucagon Hcl (Rdna)) .... As Dir 4)  Humalog 100 Unit/ml  Soln (Insulin Lispro (Human)) .... For Use in Pump, Total 150 Units/day 5)  Cvs Iron 325 (65 Fe) Mg  Tabs (Ferrous Sulfate) .... Take 1 By Mouth Once Daily 6)  Adult Aspirin Ec Low Strength 81 Mg  Tbec (Aspirin) .... Take 1 By Mouth Once Daily 7)  Claritin 10 Mg  Tabs (Loratadine) .... Take 1 By Mouth Once Daily 8)  Citalopram Hydrobromide 40 Mg Tabs (Citalopram Hydrobromide) .... Take 1 By Mouth Once Daily 9)  Fenofibrate 160 Mg Tabs (Fenofibrate) .... Take 1 Tablet By Mouth Once A Day 10)  Humalog Kwikpen 100 Unit/ml Soln (Insulin Lispro (Human)) .... As Dir 11)  Bd Pen Needle Mini U/f 31g X 5 Mm Misc (Insulin Pen Needle) .... Use As Directed 12)   Lipitor 40 Mg Tabs (Atorvastatin Calcium) .Marland Kitchen.. 1 By Mouth Once Daily 13)  Zegerid Otc 20-1100 Mg Caps (Omeprazole-Sodium Bicarbonate) .... Take 1 Cap Daily On Empty Stomach 14)  Losartan Potassium 25 Mg Tabs (Losartan Potassium) .... Take 1 Tablet Daily 15)  Methylprednisolone (Pak) 4 Mg Tabs (Methylprednisolone) .... 2 At Bedtime 16)  Tessalon 200 Mg Caps (Benzonatate) .Marland Kitchen.. 1 By Mouth Three Times A Day As Needed Cough 17)  Hydromet 5-1.5 Mg/82m Syrp (Hydrocodone-Homatropine) ..Marland Kitchen. 1-2 Tsp Every 4-6 Hrs As Needed Cough 18)  Imodium A-D 2 Mg Tabs (Loperamide Hcl) .... One Tablet At Bedtime 19)  Pepcid 20 Mg Tabs (Famotidine) .... Take 1 Tab By Mouth At Bedtime 20)  Chlor-Trimeton 4 Mg Tabs (Chlorpheniramine Maleate) .... 2  Tablet At Bedtime  Allergies (verified): 1)  ! Sulfa 2)  ! Codeine 3)  ! Biaxin 4)  ! * Aspartame 5)  ! * Pentasa  Past History:  Past medical, surgical, family and social histories (including risk factors) reviewed, and no changes noted (except as noted below).  Past Medical History: Reviewed history from 09/26/2010 and no changes required. 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 Sinus Surgery (02/10/1994)  - daily claritin since 1995 Total Protocolectomy (12/08/2005) Ileostomy take down (  06/17/2006) Insulin Pump Appendectomy  Family History: Reviewed history from 09/26/2010 and no changes required. Father-cancer of bile duct  Social History: Reviewed history from 09/26/2010 and no changes required. 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  She believes that when she returns to work with much more  activity, her glucoses will come down some more.  Married ETOH-3 drinks per week   Review of Systems       The patient complains of productive cough, non-productive cough, sore throat, and nasal congestion/difficulty breathing through nose.  The patient denies shortness of breath with activity, shortness of breath at rest, coughing up blood, chest pain, irregular heartbeats, acid heartburn, indigestion, loss of appetite, weight change, abdominal pain, difficulty swallowing, tooth/dental problems, headaches, sneezing, itching, ear ache, anxiety, depression, hand/feet swelling, joint stiffness or pain, rash, change in color of mucus, and fever.    Vital Signs:  Patient profile:   50 year old female Height:      62 inches Weight:      165.25 pounds BMI:     30.33 O2 Sat:      97 % on Room air Temp:     98.8 degrees F oral Pulse rate:   94 / minute BP sitting:   150 / 80  (right arm) Cuff size:   regular  Vitals Entered By: [place] Bing CMA (October 25, 2010 1:40 PM)  O2 Flow:  Room air CC: Pt here for 4 week follow-up. Pt c/o still having cough, PND and sore throat.  Is Patient Diabetic? Yes Comments >meds [place]ber[place] Bing CMA  October 25, 2010 1:41 PM Daytime phone number verified with patient.    Physical Exam  General:  well developed, well nourished, in no acute distressobese.    Hoarse voice + Barking cough + Head:  normocephalic and atraumatic Eyes:  PERRLA/EOM intact; conjunctiva and sclera clear Ears:  TMs intact and clear with normal canals Nose:  no deformity, discharge, inflammation, or lesions Mouth:  no deformity or lesions Neck:  no masses, thyromegaly, or abnormal cervical nodes Chest Wall:  no deformities noted Lungs:  scattered wheeze + Heart:  regular rate and rhythm, S1, S2 without murmurs, rubs, gallops, or clicks Abdomen:  bowel sounds positive; abdomen soft and non-tender without masses, or organomegaly  scar + Msk:  no deformity or  scoliosis noted with normal posture Pulses:  pulses normal Extremities:  no clubbing, cyanosis, edema, or deformity noted Neurologic:  CN II-XII grossly intact with normal reflexes, coordination, muscle strength and tone Skin:  intact without lesions or rashes Cervical Nodes:  no significant adenopathy Axillary Nodes:  no significant adenopathy Psych:  alert and cooperative; normal mood and affect; normal attention span and concentration   MISC. Report  Procedure date:  10/25/2010  Findings:      normal spirometry  Impression & Recommendations:  Problem # 1:  COUGH (ICD-786.2) Assessment Deteriorated  spirometyr is normal but suspect co-existent cough variant asthma and cylical cough appart from sinus and gerd issues (LPR)  plan 1)  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, acid reflux and maybe asthma and Cyclical cough 2)  #For sinus 3)   - continue daily netti pot saline wash with warm bottled/distilled water 4)   - continue claritin 5)  #FOr ACID REFLUX 6)   - quit all cola, coffee, spices and fried food and alcohol for 1 month 7)   -  continuet zegerid 1 cap daily on empty stomach 8)   - contnue  diet sheet 9)  #FOR POSSIBLE ASTHMA LIKE SYMPTOMS 10)   -    - take losartan potassium 1 tablet daily 11)  - start qvar - take sample and learn technique 12)     - 12 day prednisone taper 13)  #CYCLICAL COUGH   - take 3 days off next 1-2 weeks and be silent for those day without whispering or talking  - start neurontin as directed #FOLLOWUP    - 4-5 weeks or sooner if worse 14)   - real important you follow instructions strictly  - if this does not help get ct chest  Orders: Est. Patient Level IV (63016)  Medications Added to Medication List This Visit: 1)  Methylprednisolone (pak) 4 Mg Tabs (Methylprednisolone) .... 2 at bedtime 2)  Imodium A-d 2 Mg Tabs (Loperamide hcl) .... One tablet at bedtime 3)  Pepcid 20 Mg Tabs (Famotidine) .... Take  1 tab by mouth at bedtime 4)  Chlor-trimeton 4 Mg Tabs (Chlorpheniramine maleate) .... 2  tablet at bedtime 5)  Prednisone 10 Mg Tabs (Prednisone) .... Take 4 tablets daily x3 days, then 3 tablets daily x 3 days, then 2 tablets daily x3 days, then 1 tablet daily x3 days, then stop 6)  Qvar 80 Mcg/act Aers (Beclomethasone dipropionate) .... Two  puffs twice daily 7)  Gabapentin 300 Mg Caps (Gabapentin) .... Take 1 cap daily x 5 days, then 1 cap two times a day x 5 days, then 1 cap three times a day  Patient Instructions: 1)  1)  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, acid reflux and maybe asthma and Cyclical cough 2)  2)  #For sinus 3)  3)   - continue daily netti pot saline wash with warm bottled/distilled water 4)  4)   - continue claritin 5)  5)  #FOr ACID REFLUX 6)  6)   - quit all cola, coffee, spices and fried food and alcohol for 1 month 7)  7)   - continuet zegerid 1 cap daily on empty stomach 8)  8)   - contnue  diet sheet 9)  9)  #FOR POSSIBLE ASTHMA LIKE SYMPTOMS 10)  10)   -    - take losartan potassium 1 tablet daily 11)  11)  - start qvar - take sample and learn technique 12)  12)     - 12 day prednisone taper 13)  13)  #CYCLICAL COUGH 14)    - take 3 days off next 1-2 weeks and be silent for those day without whispering or talking 15)   - start neurontin as directed 16)  #FOLLOWUP 17)     - 4-5 weeks or sooner if worse 18)  14)   - real important you follow instructions strictly 19)  15)   - inform your renal doctor and lipid doctor about changes Prescriptions: GABAPENTIN 300 MG CAPS (GABAPENTIN) take 1 cap daily x 5 days, then 1 cap two times a day x 5 days, then 1 cap three times a day  #90 x 1   Entered and Authorized by:   Brand Males MD   Signed by:   Brand Males MD on 10/25/2010   Method used:   Electronically to        [facility]. # [id number]* (retail)       [address]  [place], [place]  [address]       Ph: [phone] or [phone]       Fax: [phone]   RxID:   [id number] QVAR 80 MCG/ACT  AERS (BECLOMETHASONE DIPROPIONATE) Two  puffs twice daily  #1 x 3   Entered and Authorized by:   Brand Males MD   Signed by:   Brand Males MD on 10/25/2010   Method used:   Electronically to        [facility]. # [id number]* (retail)       [address]       [place], [place]  [address]       Ph: [phone] or [phone]       Fax: [phone]   RxID:   [id number] PREDNISONE 10 MG  TABS (PREDNISONE) Take 4 tablets daily x3 days, then 3 tablets daily x 3 days, then 2 tablets daily x3 days, then 1 tablet daily x3 days, then stop  #30 x 0   Entered and Authorized by:   Brand Males MD   Signed by:   Brand Males MD on 10/25/2010   Method used:   Electronically to        [facility]. # [id number]* (retail)       [address]       [place], [place]  [address]       Ph: [phone] or [phone]       Fax: [phone]   RxID:   [id number]

## 2010-11-12 LAB — DIFFERENTIAL
Eosinophils Absolute: 1.6 10*3/uL — ABNORMAL HIGH (ref 0.0–0.7)
Eosinophils Relative: 15 % — ABNORMAL HIGH (ref 0–5)
Lymphocytes Relative: 17 % (ref 12–46)
Lymphs Abs: 1.9 10*3/uL (ref 0.7–4.0)
Monocytes Relative: 5 % (ref 3–12)
Neutrophils Relative %: 62 % (ref 43–77)

## 2010-11-12 LAB — BASIC METABOLIC PANEL
CO2: 22 mEq/L (ref 19–32)
Chloride: 103 mEq/L (ref 96–112)
GFR calc Af Amer: >60 mL/min (ref >60)
Sodium: 137 mEq/L (ref 135–145)

## 2010-11-12 LAB — CBC
Hemoglobin: 12.4 g/dL (ref 12.0–15.0)
MCH: 30 pg (ref 26.0–34.0)
MCV: 87 fL (ref 78.0–100.0)
RBC: 4.15 MIL/uL (ref 3.87–5.11)

## 2010-11-20 ENCOUNTER — Encounter: Payer: Self-pay | Admitting: Internal Medicine

## 2010-11-25 ENCOUNTER — Ambulatory Visit (INDEPENDENT_AMBULATORY_CARE_PROVIDER_SITE_OTHER): Payer: BC Managed Care – PPO | Admitting: Internal Medicine

## 2010-11-25 ENCOUNTER — Encounter: Payer: Self-pay | Admitting: Internal Medicine

## 2010-11-25 VITALS — BP 122/70 | HR 94 | Temp 98.3°F | Ht 62.0 in | Wt 170.0 lb

## 2010-11-25 DIAGNOSIS — R059 Cough, unspecified: Secondary | ICD-10-CM

## 2010-11-25 DIAGNOSIS — R05 Cough: Secondary | ICD-10-CM

## 2010-11-25 MED ORDER — GABAPENTIN 300 MG PO CAPS: 300.0000 mg | ORAL_CAPSULE | Freq: Three times a day (TID) | ORAL | Status: DC

## 2010-11-25 NOTE — Patient Instructions (Signed)
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

## 2010-11-25 NOTE — Progress Notes (Signed)
  Subjective:    Patient ID: Rhonda Gutierrez, female    DOB: October 07, 1960, 50 y.o.   MRN: [id number]  Cough This is a chronic problem.   Last visit was 2nd visit,  1 month ago. AT that time, she was no better despite sinus, gerd measures an stopping ACE inhibitors. So, added prednisone burst, qvar Rx (empiric for asthma) and cylical cough protocol with neurontin. 2 weeks after starting above she noticed singnificant improvement. Currently states cough almost gone. Reports compliance with recommendations. No new symptoms. Review of Systems  Respiratory: Positive for cough.     Constitutional:   No  weight loss, night sweats,  Fevers, chills, fatigue, lassitude. HEENT:   No headaches,  Difficulty swallowing,  Tooth/dental problems,  Sore throat,                No sneezing, itching, ear ache, nasal congestion, post nasal drip,   CV:  No chest pain,  Orthopnea, PND, swelling in lower extremities, anasarca, dizziness, palpitations  GI  No heartburn, indigestion, abdominal pain, nausea, vomiting, diarrhea, change in bowel habits, loss of appetite  Resp: No shortness of breath with exertion or at rest.  No excess mucus, no productive cough,  No non-productive cough,  No coughing up of blood.  No change in color of mucus.  No wheezing.  No chest wall deformity  Skin: no rash or lesions.  GU: no dysuria, change in color of urine, no urgency or frequency.  No flank pain.  MS:  No joint pain or swelling.  No decreased range of motion.  No back pain.  Psych:  No change in mood or affect. No depression or anxiety.  No memory loss.    Objective:   Physical Exam  Vitals reviewed. Constitutional: She is oriented to person, place, and time. She appears well-developed and well-nourished. No distress.  HENT:  Head: Normocephalic and atraumatic.  Right Ear: External ear normal.  Left Ear: External ear normal.  Mouth/Throat: Oropharynx is clear and moist. No oropharyngeal exudate.  Eyes: Conjunctivae  and EOM are normal. Pupils are equal, round, and reactive to light. Right eye exhibits no discharge. Left eye exhibits no discharge. No scleral icterus.  Neck: Normal range of motion. Neck supple. No JVD present. No tracheal deviation present. No thyromegaly present.       mallampatti class 2  Cardiovascular: Normal rate, regular rhythm, normal heart sounds and intact distal pulses.  Exam reveals no gallop and no friction rub.   No murmur heard. Pulmonary/Chest: Effort normal and breath sounds normal. No respiratory distress. She has no wheezes. She has no rales. She exhibits no tenderness.  Abdominal: Soft. Bowel sounds are normal. She exhibits no distension and no mass. There is no tenderness. There is no rebound and no guarding.  Musculoskeletal: Normal range of motion. She exhibits no edema and no tenderness.  Lymphadenopathy:    She has no cervical adenopathy.  Neurological: She is alert and oriented to person, place, and time. She has normal reflexes. No cranial nerve deficit. She exhibits normal muscle tone. Coordination normal.  Skin: Skin is warm and dry. No rash noted. She is not diaphoretic. No erythema. No pallor.  Psychiatric: She has a normal mood and affect. Her behavior is normal. Judgment and thought content normal.          Assessment & Plan:

## 2010-11-25 NOTE — Assessment & Plan Note (Signed)
Multifactorial. Did not resolve Jan 2012 with dc ace inhibitor. REsolved March 9824 with cylical cough protocol starting neurontin, empiric steroids for possible asthma, sinus and gerd measures.   PLAN Advised to continue sinus, gerd, asthma control and cylical cough control with neurontin (see instructions) ROV 2 months and if well, start scaling down

## 2010-12-11 ENCOUNTER — Other Ambulatory Visit: Payer: Self-pay | Admitting: *Deleted

## 2010-12-11 DIAGNOSIS — I10 Essential (primary) hypertension: Secondary | ICD-10-CM

## 2010-12-11 DIAGNOSIS — R059 Cough, unspecified: Secondary | ICD-10-CM

## 2010-12-11 DIAGNOSIS — R05 Cough: Secondary | ICD-10-CM

## 2010-12-11 MED ORDER — LOSARTAN POTASSIUM 25 MG PO TABS: 25.0000 mg | ORAL_TABLET | Freq: Every day | ORAL | Status: DC

## 2010-12-11 MED ORDER — GABAPENTIN 300 MG PO CAPS: 300.0000 mg | ORAL_CAPSULE | Freq: Three times a day (TID) | ORAL | Status: DC

## 2011-01-06 ENCOUNTER — Encounter: Payer: Self-pay | Admitting: *Deleted

## 2011-01-07 ENCOUNTER — Other Ambulatory Visit (INDEPENDENT_AMBULATORY_CARE_PROVIDER_SITE_OTHER): Payer: BC Managed Care – PPO

## 2011-01-07 ENCOUNTER — Ambulatory Visit (INDEPENDENT_AMBULATORY_CARE_PROVIDER_SITE_OTHER): Payer: BC Managed Care – PPO | Admitting: Endocrinology

## 2011-01-07 ENCOUNTER — Encounter: Payer: Self-pay | Admitting: Endocrinology

## 2011-01-07 VITALS — BP 132/86 | HR 101 | Temp 98.1°F | Ht 62.0 in | Wt 169.6 lb

## 2011-01-07 DIAGNOSIS — E109 Type 1 diabetes mellitus without complications: Secondary | ICD-10-CM

## 2011-01-07 NOTE — Patient Instructions (Addendum)
blood tests are being ordered for you today.  please call [phone] to hear your test results.  You will be prompted to enter the 9-digit "MRN" number that appears at the top left of this page, followed by #.  Then you will hear the message. pending the test results, please: increase basal rate to 2 units/hr, except for 6 units/hr, 3 am to 6 am continue correction bolus (which some people call "sensitivity," or "insulin sensitivity ratio," or just "isr") of 1 unit for each 25 by which your glucose exceeds 115. return 3 mos.   continue pre-meal boluses of 30-22-35 units.   Please pursue a new pump, along with a continuous monitor, when your current pump goes out of warranty later this year.   Please make a follow-up appointment in 3 months (update: i left message on phone-tree:  rx as we discussed)

## 2011-01-07 NOTE — Progress Notes (Signed)
Subjective:    Patient ID: Rhonda Gutierrez, female    DOB: 05-Apr-1961, 50 y.o.   MRN: [id number]  HPI pt states she feels well in general.  she brings a record of her cbg's which i have reviewed today.  She has to take a 25-30 unit bolus with breakfast. cbg's are variable, with no trend throughout the day.  About 1/3 are over 200. Past Medical History  Diagnosis Date  . Ulcerative colitis, unspecified   . Type I (juvenile type) diabetes mellitus without mention of complication, not stated as uncontrolled   . Unspecified essential hypertension   . Fatty liver disease, nonalcoholic   . Dyslipidemia     Past Surgical History  Procedure Date  . Nasal sinus surgery 02/10/1994  . Appendectomy   . Protocolectomy 12/08/2005    total  . Ileostomy 06/17/2006    take down  . Insulin pump     History   Social History  . Marital Status: Married    Spouse Name: N/A    Number of Children: N/A  . Years of Education: N/A   Occupational History  . 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     Social History Main Topics  . Smoking status: Never Smoker   . Smokeless tobacco: Not on file  . Alcohol Use: Not on file  . Drug Use: Not on file  . Sexually Active: Not on file   Other Topics Concern  . 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 She believes that when she returns to work with much more activity, her glucoses will come down some more.    Current Outpatient Prescriptions on File Prior to Visit  Medication Sig Dispense Refill  . aspirin 81 MG tablet Take 81 mg by mouth daily.        . beclomethasone (QVAR) 80 MCG/ACT inhaler Inhale 2 puffs into the lungs 2 (two) times daily.        . chlorpheniramine (CHLOR-TRIMETON) 4 MG tablet Take 2 mg by mouth at bedtime.        . famotidine (PEPCID) 20 MG tablet Take 20 mg by mouth daily.        . fenofibrate 160 MG tablet Take 160 mg by mouth  daily.        . ferrous gluconate (FERGON) 325 MG tablet Take 325 mg by mouth daily with breakfast.        . fluticasone (FLONASE) 50 MCG/ACT nasal spray 2 puffs by Nasal route Daily.      Marland Kitchen gabapentin (NEURONTIN) 300 MG capsule Take 1 capsule (300 mg total) by mouth 3 (three) times daily.  90 capsule  3  . glucagon (GLUCAGEN) 1 MG SOLR 1 mg. As Directed        . insulin lispro (HUMALOG) 100 UNIT/ML injection For use in pump total 150 units/day       . Insulin Pen Needle (B-D UF III MINI PEN NEEDLES) 31G X 5 MM MISC by Does not apply route.        . loperamide (IMODIUM) 2 MG capsule Take 1 mg by mouth at bedtime.        Marland Kitchen losartan (COZAAR) 25 MG tablet Take 1 tablet (25 mg total) by mouth daily.  30 tablet  6  . medroxyPROGESTERone (DEPO-PROVERA) 150 MG/ML injection Inject 150 mg into the muscle every  3 (three) months.        . Multiple Vitamin (MULTIVITAMIN) capsule Take 1 capsule by mouth daily.          Allergies  Allergen Reactions  . Altace Cough  . Aspartame   . Clarithromycin   . Codeine   . Mesalamine   . Sulfonamide Derivatives     Family History  Problem Relation Age of Onset  . Cancer Father     BP 132/86  Pulse 101  Temp(Src) 98.1 F (36.7 C) (Oral)  Ht 5' 2"  (1.575 m)  Wt 169 lb 9.6 oz (76.93 kg)  BMI 31.02 kg/m2  SpO2 97%    Review of Systems denies hypoglycemia     Objective:   Physical Exam Pulses: dorsalis pedis intact bilat.   Feet: no deformity.  no ulcer on the feet.  feet are of normal color and temp.  no edema Neuro: sensation is intact to touch on the feet     Lab Results  Component Value Date   HGBA1C 9.1* 01/07/2011      Assessment & Plan:  Dm, needs increased rx

## 2011-01-14 ENCOUNTER — Encounter: Payer: Self-pay | Admitting: Endocrinology

## 2011-01-14 NOTE — Consult Note (Signed)
[place]   OWEN, PAGNOTTA                       MRN:          [id number]  DATE:02/03/2007                            DOB:          1961/05/21    REFERRING PHYSICIAN:  Candace Gallus, M.D.   REASON FOR REFERRAL:  Diabetes.   HISTORY OF PRESENT ILLNESS:  This 50 year old woman who reports a 9-  month history of diabetes.  She presented with a severe weight loss down  to about 103 pounds.  She also had some polyuria and polydipsia.  She is  unaware of any chronic complications to diabetes.  She currently takes  Amaryl and Glucophage.  She states that despite these, her glucoses run  200s to 300s.  Symptomatically, she has several months of slight  fatigue, but no associated numbness of her feet.   PAST MEDICAL HISTORY:  1. Ulcerative colitis.  2. Dyslipidemia.  3. Hypertension.  4. 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  5. NASH.   SOCIAL HISTORY:  She is married.  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   FAMILY HISTORY:  Positive for diabetes in her father who takes Insulin  and is overweight.   REVIEW OF SYSTEMS:  Denies shortness of breath, nausea, vomiting, and  she also denies any recent change in her weight.  She has gained back  the weight she lost when she became ill last year.   PHYSICAL EXAMINATION:  VITAL SIGNS:  Blood pressure 127/80, heart rate  94, temperature 99.1, weight 131.  GENERAL:  Healthy-appearing middle aged woman in no distress.  SKIN:  Not diaphoretic.  No rash.  HEENT:  No proptosis.  No periorbital swelling.  No scleral icterus.  Pharynx is normal including mucous membranes moist.  NECK:  No goiter.  CHEST:  Clear to auscultation.  No respiratory distress.  CARDIOVASCULAR:  No edema.  Regular rate and rhythm.  No murmur.  EXTREMITIES:  Pedal pulses are intact.  There is no bruits at the  carotid arteries.  Feet normal color in temperature.   No ulcer present  on the feet.  NEUROLOGICAL:  Alert and oriented.  Does not appear anxious or  depressed.  Sensation intact to touch on the feet.   LABORATORY DATA:  Laboratory studies forwarded by Dr. Maylon Peppers on December 28, 2006, hemoglobin A1C 7.3.  It was 6.4 on August 19, 2006.   IMPRESSION:  1. The recent deterioration in her glycemic control would of course      not be fully reflected on this A1C she had on April 28.  2. Diabetes of uncertain type.  I think it is very likely she is      evolving type 1 diabetes.  3. Other chronic medical problems as noted above.   PLAN:  1. Continue oral agents for now.  2. Refer to diabetes educator, Leonia Reader, CDE to learn insulin      injections.  She will start with four units of Humalog t.i.d.      (q.a.c.)  3. We discussed the importance of diet and exercise  therapy and the      risks of diabetes.  4. Return in two days.     Sean A. Loanne Drilling, MD  Electronically Signed    SAE/MedQ  DD: 02/07/2007  DT: 02/08/2007  Job #: [id number]   cc:   Candace Gallus, M.D.

## 2011-01-14 NOTE — Consult Note (Signed)
[place]   NAME:Rhonda Gutierrez, Rhonda Gutierrez                       MRN:          [id number]  DATE:04/15/2007                            DOB:          [birth year]    REASON FOR VISIT:  Followup diabetes.   HISTORY OF PRESENT ILLNESS:  A 50 year old woman with type 1 diabetes  whose insulin requirement is steadily increasing, as expected.  She  brings with her an extensive record of her home glucoses and they are in  the low 100's, except are about 200 at bedtime and in the morning.   PAST MEDICAL HISTORY:  1. Ulcerative colitis.  2. Dyslipidemia.  3. Hypertension.  4. Acute glomerulonephritis in 2007.  5. NASH.   REVIEW OF SYSTEMS:  Denies hypoglycemia.   PHYSICAL EXAMINATION:  Blood pressure 131/84, heart rate is 87,  temperature is 98.7, the weight is 138.  GENERAL:  No distress.  Insulin injection site at the anterior abdomen  are normal.   LABORATORY STUDIES:  Hemoglobin A1c 7.3.   IMPRESSION:  Type 1 diabetes.  Her insulin requirement is evolving and  she needs more insulin now.   PLAN:  1. Increase q.a.c. Humalog to 02-07-11.  2. Continue Lantus at 12 units q.h.s.  3. Return in about 2 months.     Sean A. Loanne Drilling, MD  Electronically Signed    SAE/MedQ  DD: 04/18/2007  DT: 04/19/2007  Job #: [id number]   cc:   Candace Gallus, M.D.

## 2011-01-17 NOTE — Discharge Summary (Signed)
Rhonda Gutierrez, Rhonda Gutierrez                ACCOUNT NO.:  [id number]   MEDICAL RECORD NO.:  [id number]          PATIENT TYPE:  INP   LOCATION:  1519                         FACILITY:  [facility]   PHYSICIAN:  Corinna L. Conley Canal, MDDATE OF BIRTH:  04/16/1961   DATE OF ADMISSION:  12/22/2005  DATE OF DISCHARGE:  12/25/2005                                 DISCHARGE SUMMARY   DISCHARGE DIAGNOSES:  1.  Recurrent nausea, vomiting.  2.  Suspected adrenal insufficiency secondary to previous prednisone use.  3.  Chronic iron-deficiency anemia.  4.  History of ulcerative colitis, status post colectomy and ileostomy at      [facility] on December 08, 2005.  5.  Depression.  6.  Urinary tract infection.  7.  Chronic allergic rhinitis.  8.  Hyponatremia secondary to vomiting/dehydration.  9.  Resolved hypokalemia.  10. Acute renal insufficiency/prerenal azotemia secondary to dehydration.   DISCHARGE MEDICATIONS:  1.  She may continue her previous outpatient medications which include      aspirin 81 mg daily.  2.  Claritin 10 mg a day.  3.  Lexapro 10 mg a day.  4.  Calcium supplementation.  5.  Iron 325 mg a day.  6.  Multivitamin daily.  7.  She is also taking three more doses of ciprofloxacin 500 mg p.o. b.i.d.  8.  She has been started on hydrocortisone and should be tapered as follows      20 mg q.a.m. and 10 mg p.o. q.h.s. for 7 days, then 20 mg p.o. daily for      7 days, then 10 mg p.o. daily for 7 days and then stop.   FOLLOW UP:  She should follow up with Dr. Candace Gallus in a couple of  weeks. She is to follow up with her surgeon, Dr. Morton Stall at [facility],  as previously scheduled.   CONDITION ON DISCHARGE:  Stable.   DIET:  Regular.   ACTIVITY:  Ad lib.   CONSULTATIONS:  None.   PROCEDURE:  None.   PERTINENT LABORATORIES:  Urine culture after having been on Cipro as an  outpatient was negative. Her UA on admission showed negative nitrite,  moderate leukocyte  esterase, small hemoglobin, 3 to 6 white cells, many  bacteria. Her CBC on admission was significant for white blood cell count of  13,000 with a normal differential, hemoglobin of 10.2, hematocrit 29, MCV  87, platelet count 990,000. The following day, without signs of bleeding  after vigorous hydration, her hemoglobin was 7.6 and remained stable. Her  white blood cell count was 8.6. At discharge, her hemoglobin was 7.4.  Complete metabolic panel on admission was significant for a potassium of  3.1, sodium of 125, chloride 79, glucose 132, BUN 23, creatinine 1.7,  albumin 3.0. Lipase 67, alkaline phosphate 219. Serum iron is 24, percent  saturation 9, TIBC normal. UIBC 255.   Special studies in radiology:  Acute abdominal series showed no obstruction  or free air.   HISTORY/HOSPITAL COURSE:  Rhonda Gutierrez is a very nice 50 year old white  female, patient of Dr. Melanie Crazier.  Rhonda Gutierrez, who had undergone a colectomy at  [facility] last month. Since then, she has had recurrent  hospitalizations for intractable vomiting. These hospitalizations prior to  this had been at [facility]. She had been discharged a few days prior to this  admission. Her vomiting returned and apparently urinalysis was done as an  outpatient and she was started on Cipro by Dr. Morton Stall, her surgeon. She  presented to the emergency room here and wished to be treated here at [facility] rather than transfer to [facility]. She has a history of  ulcerative colitis and had been on prednisone prior to surgery. It sounds as  if this was a relatively small dose. However, she was started on stress dose  steroids upon admission which essentially resolved her vomiting. I suspect  that she did have an element of adrenal suppression from previous prednisone  and I have discharged her on a taper of hydrocortisone. Her hyponatremia  improved within a day and was 131. Her potassium was repleted. Her diet was  advanced. At the time  of discharge, she was eating nearly a 100% of her  solid meals.   She did drop her hemoglobin after having received several liters of IV  fluids. She had no bleeding in her ileostomy bag nor did she have any  abdominal tenderness or bloating. She had no free air on x-ray. Upon further  questioning, she reported that her hemoglobin had been about 7.5 for quite  some time postoperatively. She also apparently takes iron chronically and I  therefore gather that she has chronic anemia. I asked for records from  [facility] but as of yet have not received any.   The patient was doing well with this hemoglobin and we opted not to  transfuse.   She will need this followed as an outpatient.   Her Cipro was continued throughout her hospitalization. The urinalysis was  consistent with a UTI but her culture was negative. This is not surprising  given the fact that she had been on Cipro for a day. She should follow up  with Dr. Candace Gallus in a couple of weeks to oversee the  hydrocortisone. She is to follow up with her surgeon as previously  scheduled. I believe this is Jan 16, 2006.      Corinna L. Conley Canal, MD  Electronically Signed     CLS/MEDQ  D:  12/25/2005  T:  12/25/2005  Job:  [id number]   cc:   Candace Gallus, M.D.  Fax: [phone]

## 2011-01-17 NOTE — H&P (Signed)
NAMEYMANI, PORCHER NO.:  [id number]   MEDICAL RECORD NO.:  [id number]          PATIENT TYPE:  EMS   LOCATION:  ED                           FACILITY:  [facility]   PHYSICIAN:  Jerelene Redden, MD      DATE OF BIRTH:  [birth year]   DATE OF ADMISSION:  12/22/2005  DATE OF DISCHARGE:                                HISTORY & PHYSICAL   Rhonda Gutierrez is a 50 year old lady who has a past history of ulcerative  colitis. She states that she has been hospitalized on three occasions in the  past at [facility] because of episodes of nausea, vomiting and  dehydration. According to the patient, she was treated with prednisone,  Colazal and azathioprine until December 08, 2005 when she underwent a colectomy  and ileostomy. She remained in [facility] from April 9 until December 19, 2005 when she was discharged. She states that on the day she was discharged  she felt well but that subsequently she has felt extremely dizzy and weak  and has been unable to eat secondary to nausea and vomiting. She has also  had profound dysuria and urinary frequency. She contacted Dr. Morton Stall'  partner yesterday and was placed on Cipro presumably 500 milligrams b.i.d.  for a urinary tract infection on empiric basis. She has continued to  experience nausea, vomiting and weakness and therefore presented to the  [facility] Emergency Room.   On arrival to the emergency room, blood pressure is 95/64, pulse was 117.  She was started on intravenous fluids and was given Dilaudid 2 milligrams  for pain as well as Zofran 4 milligrams for nausea. Laboratory studies  obtained included a sodium of 125, potassium 3.1, her creatinine is 1.7, BUN  was 23. Liver functions were normal. A hemoglobin was 10.2, white count was  13,200. A KUB and upright abdomen was obtained and this showed no signs of  obstruction. However, as the patient is quite weak and continues to  experience nausea and vomiting, it was felt  prudent to admit her to the  hospital for further evaluation. Rhonda Gutierrez states that her ileostomy seems  to be functioning normally. There has been no blood in the ileostomy bag.  There has been no redness around the ileostomy site. Her midline abdominal  incision has a very normal appearance. There has been no obvious redness or  drainage. She has had no other associated symptoms, i.e. no fever, breathing  difficulty, chest pain or back pain.   PAST MEDICAL HISTORY:  Her current medications consist of Claritin 10  milligrams daily, Lexapro 10 milligrams daily, Lopid 600 milligrams b.i.d.,  a multiple vitamin and iron pill, calcium pill. She is allergic to BIAXIN,  Emmett, SULFA and VIOXX as well as ASPARTAME.   PAST SURGICAL HISTORY:  She is had a cholecystectomy in 1993, a sinus  operation in 1995. She has also had two uneventful childbirths.   MEDICAL ILLNESSES:  1.  Ulcerative colitis.  2.  Chronic allergic rhinitis.  3.  Hyperlipidemia.  4.  Possible urinary tract infection.  FAMILY HISTORY:  Noncontributory.   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 She does not  abuse drugs. She does not drink alcohol. She does not smoke. She is  accompanied by her husband who is quite attentive to her needs.   REVIEW OF SYSTEMS:  HEAD:  She denies headache or dizziness. EYES:  She  denies visual blurring or diplopia. EAR, NOSE AND THROAT:  Denies earache,  sinus pain or sore throat. CHEST:  Denies coughing, wheezing or chest  congestion. CARDIOVASCULAR:  Denies orthopnea, PND or ankle edema. GI: See  above. Note that there has been no history of hematemesis or melena. GU: See  above. NEURO:  There is no history of seizure or stroke. ENDOCRINE:  Denies  excessive thirst, urinary frequency or nocturia.   PHYSICAL EXAM:  Blood pressure currently is 105/66, pulse 94, O2 saturation  99%. HEENT exam is within normal limits. The chest is clear. Examination of  the  back reveals no CVA or point tenderness.  Cardiovascular exam reveals  normal S1 and S2. There are no rubs, murmurs or gallops. Abdominal exam the  ileostomy looks great. It is red. It is functioning normally. There is no  blood in the ileostomy bag. The midline abdominal incision appears to be  well approximated. It  is clean. It is not red. It is not draining. It is  not puffy or swollen. The abdomen is mildly diffusely tender without  guarding or rebound. Neurologic testing and examination of the extremities  are normal.   IMPRESSION:  1.  Recurrent vomiting, postoperative.  2.  Ulcerative colitis status post ileostomy, recent prednisone therapy.  3.  Depression.  4.  Cholecystectomy.  5.  Dysuria, possible urinary tract infection.   PLAN:  Will continue intravenous fluids at a rapid rate as the patient  appears to be relatively dehydrated. I am going to give her stress doses of  steroids because of recent prednisone therapy, although the status of her  adrenal axis is unclear. Will check a UA and CNS; however, as she has  already been started on Cipro, it is probably a good idea to continue an  agent to treat a possible urinary tract infection as her urinalysis may be  difficult to interpret. We will continue nausea medicine and pain medicine.  As the KUB and upright abdomen does not show any signs of obstruction, her  abdomen is relatively benign. There does not seem to be any time of  catastrophic event occurring. Probably the most prudent approach will be to  discuss the patient's case with Dr. Morton Stall in the morning and then decide  whether he wants to transfer her to [facility] to his care or to take some  other type of action.           ______________________________  Jerelene Redden, MD     SY/MEDQ  D:  12/22/2005  T:  12/23/2005  Job:  [id number]   cc:   Cheryll Cockayne, M.D.  [facility], [place], [place]

## 2011-01-17 NOTE — Op Note (Signed)
NAMEJAMILEE, Rhonda Gutierrez NO.:  [id number]   MEDICAL RECORD NO.:  [id number]          PATIENT TYPE:  AMB   LOCATION:  ENDO                         FACILITY:  [facility]   PHYSICIAN:  Wonda Horner, M.D.   DATE OF BIRTH:  23-Nov-1960   DATE OF PROCEDURE:  10/03/2004  DATE OF DISCHARGE:                                 OPERATIVE REPORT   PROCEDURE:  Colonoscopy with biopsy.   INDICATIONS FOR PROCEDURE:  Persistent complaints of diarrhea over the past  year.  Stool studies were negative for enteric pathogens for Clostridium  difficile toxin, ova and parasites were also negative. The stool did show  fecal leukocytes.   Informed consent was obtained after explanation of the risks of bleeding,  infection and perforation.   PREMEDICATION:  Fentanyl 100 mcg IV, Versed 10 milligrams IV.   PROCEDURE:  With the patient in the left lateral decubitus position. A  rectal exam was performed. No masses were felt. The Olympus colonoscope was  inserted into the rectum and advanced around the colon to the cecum,  cecal  landmarks were identified. The scope was brought out visualizing the colonic  mucosa which showed findings compatible with Universal ulcerative colitis,  the mucosa was inflamed and ulcerated from rectum to cecum in a continuous  fashion. Biopsies were obtained from the left colon for histological  inspection.  She tolerated the procedure well without complications.   IMPRESSION:  Universal colitis.   PLAN:  The biopsies will be checked. The patient will be started on Colazal,  3 pills p.o. t.i.d. I will have her follow-up in the office in 4-6 weeks.      SFG/MEDQ  D:  10/03/2004  T:  10/03/2004  Job:  [id number]   cc:   Candace Gallus, M.D.  [address] [place] [id number]  Fax: [phone]

## 2011-01-20 ENCOUNTER — Encounter: Payer: Self-pay | Admitting: Internal Medicine

## 2011-01-24 ENCOUNTER — Ambulatory Visit: Payer: BC Managed Care – PPO | Admitting: Internal Medicine

## 2011-02-04 ENCOUNTER — Ambulatory Visit (INDEPENDENT_AMBULATORY_CARE_PROVIDER_SITE_OTHER): Payer: BC Managed Care – PPO | Admitting: Internal Medicine

## 2011-02-04 ENCOUNTER — Encounter: Payer: Self-pay | Admitting: Internal Medicine

## 2011-02-04 VITALS — BP 130/80 | HR 97 | Temp 98.3°F | Ht 62.0 in | Wt 172.2 lb

## 2011-02-04 DIAGNOSIS — R059 Cough, unspecified: Secondary | ICD-10-CM

## 2011-02-04 DIAGNOSIS — R05 Cough: Secondary | ICD-10-CM

## 2011-02-04 NOTE — Assessment & Plan Note (Signed)
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   - no need to go back on neurontin - when feel the urge to cough, drink water or swallow or suck on sugar less mint like jolley ranchers #FOLLOWUP    -if cough recurs which it can if you do not keep your sinuses or acid reflux under control  - if recurs, you might need methacholine challenge test to see if there  Is asthma

## 2011-02-04 NOTE — Progress Notes (Signed)
  Subjective:    Patient ID: Rhonda Gutierrez, female    DOB: 06-27-1961, 50 y.o.   MRN: [id number]  HPIThis is a chronic problem.  OV 11/25/2010: Last visit was 2nd visit, 1 month ago. AT that time, she was no better despite sinus, gerd measures an stopping ACE inhibitors. So, added prednisone burst, qvar Rx (empiric for asthma) and cylical cough protocol with neurontin. 2 weeks after starting above she noticed singnificant improvement. Currently states cough almost gone. Reports compliance with recommendations. No new symptoms. REC: continue sinus, gerd, neurontin, empiric steroids and   Ov 02/04/2011: Cough still in remssion. She feels very happy about it. No cough at at all. Stopped neurontin 4-6 weeks ago. Continues with sinus control, gerd control and empiric asthma/ICS Rx as advised. No new symptoms.    Review of Systems  Constitutional: Negative for fever and unexpected weight change.  HENT: Negative for ear pain, nosebleeds, congestion, sore throat, rhinorrhea, sneezing, trouble swallowing, dental problem, postnasal drip and sinus pressure.   Eyes: Negative for redness and itching.  Respiratory: Negative for cough, chest tightness, shortness of breath and wheezing.   Cardiovascular: Negative for palpitations and leg swelling.  Gastrointestinal: Negative for nausea and vomiting.  Genitourinary: Negative for dysuria.  Musculoskeletal: Negative for joint swelling.  Skin: Negative for rash.  Neurological: Negative for headaches.  Hematological: Does not bruise/bleed easily.  Psychiatric/Behavioral: Negative for dysphoric mood. The patient is not nervous/anxious.        Objective:   Physical Exam    Vitals reviewed.  Constitutional: She is oriented to person, place, and time. She appears well-developed and well-nourished. No distress.  HENT:  Head: Normocephalic and atraumatic.  Right Ear: External ear normal.  Left Ear: External ear normal.  Mouth/Throat: Oropharynx is clear and  moist. No oropharyngeal exudate.  Eyes: Conjunctivae and EOM are normal. Pupils are equal, round, and reactive to light. Right eye exhibits no discharge. Left eye exhibits no discharge. No scleral icterus.  Neck: Normal range of motion. Neck supple. No JVD present. No tracheal deviation present. No thyromegaly present.  mallampatti class 2  Cardiovascular: Normal rate, regular rhythm, normal heart sounds and intact distal pulses. Exam reveals no gallop and no friction rub.  No murmur heard.  Pulmonary/Chest: Effort normal and breath sounds normal. No respiratory distress. She has no wheezes. She has no rales. She exhibits no tenderness.  Abdominal: Soft. Bowel sounds are normal. She exhibits no distension and no mass. There is no tenderness. There is no rebound and no guarding.  Musculoskeletal: Normal range of motion. She exhibits no edema and no tenderness.  Lymphadenopathy:  She has no cervical adenopathy.  Neurological: She is alert and oriented to person, place, and time. She has normal reflexes. No cranial nerve deficit. She exhibits normal muscle tone. Coordination normal.  Skin: Skin is warm and dry. No rash noted. She is not diaphoretic. No erythema. No pallor.  Psychiatric: She has a normal mood and affect. Her behavior is normal. Judgment and thought content normal.         Assessment & Plan:

## 2011-02-04 NOTE — Patient Instructions (Addendum)
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   - no need to go back on neurontin - when feel the urge to cough, drink water or swallow or suck on sugar less mint like jolley ranchers #FOLLOWUP    -if cough recurs which it can if you do not keep your sinuses or acid reflux under control  - if recurs, you might need methacholine challenge test to see if there  Is asthma

## 2011-02-07 ENCOUNTER — Encounter: Payer: Self-pay | Admitting: Internal Medicine

## 2011-03-04 ENCOUNTER — Other Ambulatory Visit: Payer: Self-pay | Admitting: Endocrinology

## 2011-03-06 ENCOUNTER — Other Ambulatory Visit: Payer: Self-pay | Admitting: *Deleted

## 2011-03-06 MED ORDER — INSULIN LISPRO 100 UNIT/ML ~~LOC~~ SOLN: SUBCUTANEOUS | Status: DC

## 2011-03-06 NOTE — Telephone Encounter (Signed)
[facility] called regarding refill of pt's Humalog. They received a refill for the Humalog Kwiwpen, pt needs refill of vials.

## 2011-04-08 ENCOUNTER — Encounter: Payer: Self-pay | Admitting: Endocrinology

## 2011-04-08 ENCOUNTER — Other Ambulatory Visit (INDEPENDENT_AMBULATORY_CARE_PROVIDER_SITE_OTHER): Payer: BC Managed Care – PPO

## 2011-04-08 ENCOUNTER — Ambulatory Visit (INDEPENDENT_AMBULATORY_CARE_PROVIDER_SITE_OTHER): Payer: BC Managed Care – PPO | Admitting: Endocrinology

## 2011-04-08 VITALS — BP 132/80 | HR 96 | Temp 97.8°F | Ht 62.5 in | Wt 170.2 lb

## 2011-04-08 DIAGNOSIS — E109 Type 1 diabetes mellitus without complications: Secondary | ICD-10-CM

## 2011-04-08 DIAGNOSIS — N259 Disorder resulting from impaired renal tubular function, unspecified: Secondary | ICD-10-CM

## 2011-04-08 DIAGNOSIS — R209 Unspecified disturbances of skin sensation: Secondary | ICD-10-CM

## 2011-04-08 LAB — VITAMIN B12: Vitamin B-12: 417 pg/mL (ref 211–911)

## 2011-04-08 LAB — HEMOGLOBIN A1C: Hgb A1c MFr Bld: 9.2 % — ABNORMAL HIGH (ref 4.6–6.5)

## 2011-04-08 NOTE — Patient Instructions (Addendum)
blood tests are being ordered for you today.  please call [phone] to hear your test results.  You will be prompted to enter the 9-digit "MRN" number that appears at the top left of this page, followed by #.  Then you will hear the message. pending the test results, please: increase basal rate to 2.2 units/hr, except for 6 units/hr, 3 am to 6 am continue correction bolus (which some people call "sensitivity," or "insulin sensitivity ratio," or just "isr") of 1 unit for each 25 by which your glucose exceeds 115. return 3 mos.   continue pre-meal boluses of 35-25-35 units.   Please make a follow-up appointment in 3 months. (update: i left message on phone-tree:  rx as we discussed)

## 2011-04-08 NOTE — Progress Notes (Signed)
Subjective:    Patient ID: Rhonda Gutierrez, female    DOB: 1961-04-15, 50 y.o.   MRN: [id number]  HPI Pt says her last a1c was affected by prednisone.  she brings a record of her cbg's which i have reviewed today.  It was low (60's) once after evening meal.   She was not active then, and she is not sure why it happend.  It is still often over 200.  She had to order a new pump, due to breakage.   Past Medical History  Diagnosis Date  . Ulcerative colitis, unspecified   . Type I (juvenile type) diabetes mellitus without mention of complication, not stated as uncontrolled   . Unspecified essential hypertension   . Fatty liver disease, nonalcoholic   . Dyslipidemia     Past Surgical History  Procedure Date  . Nasal sinus surgery 02/10/1994  . Appendectomy   . Protocolectomy 12/08/2005    total  . Ileostomy 06/17/2006    take down  . Insulin pump     History   Social History  . Marital Status: Married    Spouse Name: N/A    Number of Children: N/A  . Years of Education: N/A   Occupational History  . 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     Social History Main Topics  . Smoking status: Never Smoker   . Smokeless tobacco: Not on file  . Alcohol Use: Not on file  . Drug Use: Not on file  . Sexually Active: Not on file   Other Topics Concern  . 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 She believes that when she returns to work with much more activity, her glucoses will come down some more.    Current Outpatient Prescriptions on File Prior to Visit  Medication Sig Dispense Refill  . aspirin 81 MG tablet Take 81 mg by mouth daily.        Marland Kitchen atorvastatin (LIPITOR) 80 MG tablet Take 80 mg by mouth daily.        . chlorpheniramine (CHLOR-TRIMETON) 4 MG tablet Take 2 mg by mouth at bedtime.        Marland Kitchen escitalopram (LEXAPRO) 20 MG tablet Take 20 mg by mouth daily.        . famotidine  (PEPCID) 20 MG tablet Take 20 mg by mouth daily.        . fenofibrate 160 MG tablet Take 160 mg by mouth daily.        . ferrous gluconate (FERGON) 325 MG tablet Take 325 mg by mouth daily with breakfast.        . fluticasone (FLONASE) 50 MCG/ACT nasal spray 2 puffs by Nasal route Daily.      Marland Kitchen glucagon (GLUCAGEN) 1 MG SOLR 1 mg. As Directed        . HUMALOG 100 UNIT/ML injection FOR USE IN PUMP INJECT 150 UNITS PER DAY  50 mL  5  . insulin lispro (HUMALOG KWIKPEN) 100 UNIT/ML injection As directed       . Insulin Pen Needle (B-D UF III MINI PEN NEEDLES) 31G X 5 MM MISC by Does not apply route.        . loperamide (IMODIUM) 2 MG capsule Take 1 mg by mouth at bedtime.        Marland Kitchen losartan (COZAAR) 25 MG tablet Take  1 tablet (25 mg total) by mouth daily.  30 tablet  6  . medroxyPROGESTERone (DEPO-PROVERA) 150 MG/ML injection Inject 150 mg into the muscle every 3 (three) months.        . Multiple Vitamin (MULTIVITAMIN) capsule Take 1 capsule by mouth daily.          Allergies  Allergen Reactions  . Altace Cough  . Aspartame   . Clarithromycin   . Codeine   . Mesalamine   . Sulfonamide Derivatives     Family History  Problem Relation Age of Onset  . Cancer Father     BP 132/80  Pulse 96  Temp(Src) 97.8 F (36.6 C) (Oral)  Ht 5' 2.5" (1.588 m)  Wt 170 lb 3.2 oz (77.202 kg)  BMI 30.63 kg/m2  SpO2 97%  Review of Systems Denies loc    Objective:   Physical Exam GENERAL: no distress Pulses: dorsalis pedis intact bilat.   Feet: no deformity.  no ulcer on the feet.  feet are of normal color and temp.  no edema Neuro: sensation is intact to touch on the feet    Lab Results  Component Value Date   HGBA1C 9.2* 04/08/2011   Assessment & Plan:  Type 1 dm, needs increased rx.

## 2011-04-15 ENCOUNTER — Other Ambulatory Visit: Payer: Self-pay | Admitting: Endocrinology

## 2011-04-16 ENCOUNTER — Other Ambulatory Visit: Payer: Self-pay | Admitting: *Deleted

## 2011-04-16 MED ORDER — INSULIN PEN NEEDLE 31G X 5 MM MISC: Status: DC

## 2011-04-16 NOTE — Telephone Encounter (Signed)
R'cd fax from  [facility] for refill of pen needles   Last OV-04/08/2011  Last filled-04/06/2011

## 2011-07-08 ENCOUNTER — Other Ambulatory Visit (INDEPENDENT_AMBULATORY_CARE_PROVIDER_SITE_OTHER): Payer: BC Managed Care – PPO

## 2011-07-08 ENCOUNTER — Ambulatory Visit (INDEPENDENT_AMBULATORY_CARE_PROVIDER_SITE_OTHER): Payer: BC Managed Care – PPO | Admitting: Endocrinology

## 2011-07-08 ENCOUNTER — Encounter: Payer: Self-pay | Admitting: Endocrinology

## 2011-07-08 VITALS — BP 122/82 | HR 90 | Temp 98.2°F | Ht 62.0 in | Wt 169.5 lb

## 2011-07-08 DIAGNOSIS — E109 Type 1 diabetes mellitus without complications: Secondary | ICD-10-CM

## 2011-07-08 DIAGNOSIS — Z23 Encounter for immunization: Secondary | ICD-10-CM

## 2011-07-08 LAB — HEMOGLOBIN A1C: Hgb A1c MFr Bld: 8.2 % — ABNORMAL HIGH (ref 4.6–6.5)

## 2011-07-08 NOTE — Patient Instructions (Addendum)
blood tests are being ordered for you today.  please call [phone] to hear your test results.  You will be prompted to enter the 9-digit "MRN" number that appears at the top left of this page, followed by #.  Then you will hear the message. pending the test results, please: increase basal rate to 2.2 units/hr, except for 6 units/hr, 3 am to 6 am continue correction bolus (which some people call "sensitivity," or "insulin sensitivity ratio," or just "isr") of 1 unit for each 25 by which your glucose exceeds 115. return 3 mos.   continue pre-meal boluses of 35-25-35 units.   Please make a follow-up appointment in 3 months. (update: i left message on phone-tree:  Increase basal rate 3am-6am to 7 units/hr)

## 2011-07-08 NOTE — Progress Notes (Signed)
Subjective:    Patient ID: Rhonda Gutierrez, female    DOB: 06/13/1961, 50 y.o.   MRN: [id number]  HPI pt states she feels well in general.  she brings a record of her cbg's which i have reviewed today.  She had had 2 episodes of hypoglycemia, both just after evening meal.  Both resolved without rx.   Past Medical History  Diagnosis Date  . Ulcerative colitis, unspecified   . Type I (juvenile type) diabetes mellitus without mention of complication, not stated as uncontrolled   . Unspecified essential hypertension   . Fatty liver disease, nonalcoholic   . Dyslipidemia     Past Surgical History  Procedure Date  . Nasal sinus surgery 02/10/1994  . Appendectomy   . Protocolectomy 12/08/2005    total  . Ileostomy 06/17/2006    take down  . Insulin pump     History   Social History  . Marital Status: Married    Spouse Name: N/A    Number of Children: N/A  . Years of Education: N/A   Occupational History  . 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     Social History Main Topics  . Smoking status: Never Smoker   . Smokeless tobacco: Not on file  . Alcohol Use: Not on file  . Drug Use: Not on file  . Sexually Active: Not on file   Other Topics Concern  . 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 She believes that when she returns to work with much more activity, her glucoses will come down some more.    Current Outpatient Prescriptions on File Prior to Visit  Medication Sig Dispense Refill  . aspirin 81 MG tablet Take 81 mg by mouth daily.        Marland Kitchen atorvastatin (LIPITOR) 80 MG tablet Take 80 mg by mouth daily.        . chlorpheniramine (CHLOR-TRIMETON) 4 MG tablet Take 2 mg by mouth at bedtime.        Marland Kitchen escitalopram (LEXAPRO) 20 MG tablet Take 20 mg by mouth daily.        . famotidine (PEPCID) 20 MG tablet Take 20 mg by mouth daily.        . fenofibrate 160 MG tablet Take 160 mg  by mouth daily.        . ferrous gluconate (FERGON) 325 MG tablet Take 325 mg by mouth daily with breakfast.        . fluticasone (FLONASE) 50 MCG/ACT nasal spray 2 puffs by Nasal route Daily.      Marland Kitchen glucagon (GLUCAGEN) 1 MG SOLR 1 mg. As Directed        . HUMALOG 100 UNIT/ML injection FOR USE IN PUMP INJECT 150 UNITS PER DAY  50 mL  5  . HUMALOG KWIKPEN 100 UNIT/ML injection USE AS DIRECTED BY PRESCRIB ER  15 mL  11  . insulin lispro (HUMALOG KWIKPEN) 100 UNIT/ML injection As directed       . Insulin Pen Needle (B-D UF III MINI PEN NEEDLES) 31G X 5 MM MISC Use as directed by prescriber  100 each  3  . loperamide (IMODIUM) 2 MG capsule Take 1 mg by mouth every morning.       Marland Kitchen losartan (COZAAR) 25 MG tablet Take 1 tablet (25 mg total) by mouth daily.  [place]  tablet  6  . medroxyPROGESTERone (DEPO-PROVERA) 150 MG/ML injection Inject 150 mg into the muscle every 3 (three) months.        . Multiple Vitamin (MULTIVITAMIN) capsule Take 1 capsule by mouth daily.          Allergies  Allergen Reactions  . Altace Cough  . Aspartame   . Clarithromycin   . Codeine   . Mesalamine   . Sulfonamide Derivatives    Family History  Problem Relation Age of Onset  . Cancer Father    BP 122/82  Pulse 90  Temp(Src) 98.2 F (36.8 C) (Oral)  Ht 5' 2"  (1.575 m)  Wt 169 lb 8 oz (76.885 kg)  BMI 31.00 kg/m2  SpO2 97%  Review of Systems Denies loc.    Objective:   Physical Exam VITAL SIGNS:  See vs page GENERAL: no distress SKIN:  Insulin infusion sites at the anterior abdomen are normal.  Lab Results  Component Value Date   HGBA1C 8.2* 07/08/2011      Assessment & Plan:  Type 1 dm, improved, but needs increased rx

## 2011-07-18 ENCOUNTER — Telehealth: Payer: Self-pay | Admitting: Internal Medicine

## 2011-07-18 ENCOUNTER — Encounter: Payer: Self-pay | Admitting: Internal Medicine

## 2011-07-18 ENCOUNTER — Ambulatory Visit (INDEPENDENT_AMBULATORY_CARE_PROVIDER_SITE_OTHER): Payer: BC Managed Care – PPO | Admitting: Internal Medicine

## 2011-07-18 VITALS — BP 130/88 | HR 97 | Temp 98.5°F | Ht 62.0 in | Wt 173.2 lb

## 2011-07-18 DIAGNOSIS — R05 Cough: Secondary | ICD-10-CM

## 2011-07-18 DIAGNOSIS — R059 Cough, unspecified: Secondary | ICD-10-CM

## 2011-07-18 MED ORDER — RAMIPRIL 2.5 MG PO CAPS: 5.0000 mg | ORAL_CAPSULE | Freq: Every day | ORAL | Status: DC

## 2011-07-18 NOTE — Assessment & Plan Note (Signed)
In Jan 2012 cough did not resolve with dc of ace inhibitor but in March 2012 it resolved with Rx of sinus drainage, gerd, cyclical cough (LPR cough). This does not mean ACE inhibitor was not the initiating event for cough. ACE inhibitor certainly could have initiated cough. Cough is now resolved. I explained that cough is seldom life or organ threatening. But renal issues are far more important. Therefore, okay'ed restarting ramipril (we will send the initial script) and monitoring. Advised to ensure that other triggers for cough (sinus, and gerd) are always under control. If cough returned and severe, then we need to know from renal about other ARB alternatives that can control proteinuria and steps poatient can take in controlling bP/DM better.   > 15 min visit. > 50% of time in face to face counseling

## 2011-07-18 NOTE — Telephone Encounter (Signed)
Ensure note sent to [facility]

## 2011-07-18 NOTE — Progress Notes (Signed)
Subjective:    Patient ID: Rhonda Gutierrez, female    DOB: 1960-12-23, 50 y.o.   MRN: [id number]  HPI This is a chronic problem.  OV 11/25/2010: Last visit was 2nd visit, 1 month ago. AT that time, she was no better despite sinus, gerd measures an stopping ACE inhibitors. So, added prednisone burst, qvar Rx (empiric for asthma) and cylical cough protocol with neurontin. 2 weeks after starting above she noticed singnificant improvement. Currently states cough almost gone. Reports compliance with recommendations. No new symptoms. REC: continue sinus, gerd, neurontin, empiric steroids and   Ov 02/04/2011: Cough still in remssion. She feels very happy about it. No cough at at all. Stopped neurontin 4-6 weeks ago. Continues with sinus control, gerd control and empiric asthma/ICS Rx as advised. No new symptoms. Vitals reviewed.    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   - no need to go back on neurontin - when feel the urge to cough, drink water or swallow or suck on sugar less mint like jolley ranchers #FOLLOWUP    -if cough recurs which it can if you do not keep your sinuses or acid reflux under control  - if recurs, you might need methacholine challenge test to see if there  Is asthma - rov prn  07/18/2011 OV: Followup. She is scheduled visit. States in August 2011 urine protein was 117 but then after that I  stopped ramipiril (started cozaar) for cough. She had her urine protein recheck 10 days ago and is 249m/day on 24h collection. She says her nephrologist Dr VNadine Countsis very concerned about worsening proteinuria. She is wondering about restarting ramipril and is very keen she does. She is very worried about renal health. She states her DM is under control but BP runs a tad higher recently.  Currently denies cough.   No change in past, family, social  Review of Systems    per HPI. No other change Objective:   Physical Exam   Constitutional: She is oriented to person, place, and time. She appears well-developed and well-nourished. No distress.  HENT:  Head: Normocephalic and atraumatic.  Right Ear: External ear normal.  Left Ear: External ear normal.  Eyes: Conjunctivae and EOM are normal. Pupils are equal, round, and reactive to light. Right eye exhibits no discharge. Left eye exhibits no discharge. No scleral icterus.  Neck: Normal range of motion. Neck supple. No JVD present. No tracheal deviation present. No thyromegaly present.  mallampatti class 2  Pulmonary/Chest: Effort normal and breath sounds normal. No respiratory distress. She has no wheezes. She has no rales. She exhibits no tenderness.  Neurological: She is alert and oriented to person, place, and time. She has normal reflexes. No cranial nerve deficit. She exhibits normal muscle tone. Coordination  normal.  Skin: Skin is warm and dry. No rash noted. She is not diaphoretic. No erythema. No pallor.  Psychiatric: She has a normal mood and affect. Her behavior is normal. Judgment and thought content normal.        Assessment & Plan:

## 2011-07-18 NOTE — Telephone Encounter (Signed)
Note was received.[place], CMA

## 2011-07-18 NOTE — Patient Instructions (Signed)
Ok to try back ramipril at original dose; nurse will send 30 day supply + 3 refills Stop cozaar IF cough comes back, call us and come back. IF so, an alternative will be to discuss with nephrologist about another drug in Cozaar family and/or improved bP control  Happy Thanksgiving

## 2011-10-07 ENCOUNTER — Ambulatory Visit (INDEPENDENT_AMBULATORY_CARE_PROVIDER_SITE_OTHER): Payer: BC Managed Care – PPO | Admitting: Endocrinology

## 2011-10-07 ENCOUNTER — Encounter: Payer: Self-pay | Admitting: Endocrinology

## 2011-10-07 ENCOUNTER — Other Ambulatory Visit (INDEPENDENT_AMBULATORY_CARE_PROVIDER_SITE_OTHER): Payer: BC Managed Care – PPO

## 2011-10-07 DIAGNOSIS — E109 Type 1 diabetes mellitus without complications: Secondary | ICD-10-CM

## 2011-10-07 DIAGNOSIS — G5600 Carpal tunnel syndrome, unspecified upper limb: Secondary | ICD-10-CM

## 2011-10-07 DIAGNOSIS — G5603 Carpal tunnel syndrome, bilateral upper limbs: Secondary | ICD-10-CM | POA: Insufficient documentation

## 2011-10-07 MED ORDER — INSULIN LISPRO 100 UNIT/ML ~~LOC~~ SOLN: SUBCUTANEOUS | Status: DC

## 2011-10-07 NOTE — Patient Instructions (Addendum)
blood tests are being ordered for you today.  please call [phone] to hear your test results.  You will be prompted to enter the 9-digit "MRN" number that appears at the top left of this page, followed by #.  Then you will hear the message. pending the test results, please: increase basal rate to 2.2 units/hr, except for 7 units/hr, 3 am to 6 am continue correction bolus (which some people call "sensitivity," or "insulin sensitivity ratio," or just "isr") of 1 unit for each 25 by which your glucose exceeds 115. return 3 mos.   continue pre-meal boluses of 35-25-35 units.   Please make a follow-up appointment in 3 months.  (update: i left message on phone-tree:  Increase pre-meal boluses to 40-30-40 units).

## 2011-10-07 NOTE — Progress Notes (Signed)
Subjective:    Patient ID: Rhonda Gutierrez, female    DOB: 1961/04/17, 51 y.o.   MRN: [id number]  HPI Pt returns for f/u of type 1 DM (2008).  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  However, she says this does not affect her insulin requirement.  she brings a record of her cbg's which i have reviewed today.  It varies from 98-180.  It is in general higher as the day goes on.   Past Medical History  Diagnosis Date  . Ulcerative colitis, unspecified   . Type I (juvenile type) diabetes mellitus without mention of complication, not stated as uncontrolled   . Unspecified essential hypertension   . Fatty liver disease, nonalcoholic   . Dyslipidemia     Past Surgical History  Procedure Date  . Nasal sinus surgery 02/10/1994  . Appendectomy   . Protocolectomy 12/08/2005    total  . Ileostomy 06/17/2006    take down  . Insulin pump     History   Social History  . Marital Status: Married    Spouse Name: N/A    Number of Children: N/A  . Years of Education: N/A   Occupational History  . 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     Social History Main Topics  . Smoking status: Never Smoker   . Smokeless tobacco: Not on file  . Alcohol Use: Not on file  . Drug Use: Not on file  . Sexually Active: Not on file   Other Topics Concern  . 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 She believes that when she returns to work with much more activity, her glucoses will come down some more.    Current Outpatient Prescriptions on File Prior to Visit  Medication Sig Dispense Refill  . aspirin 81 MG tablet Take 81 mg by mouth daily.        Marland Kitchen atorvastatin (LIPITOR) 80 MG tablet Take 80 mg by mouth daily.        . cloNIDine (CATAPRES) 0.1 MG tablet Take 0.1 mg by mouth at bedtime.        Marland Kitchen escitalopram (LEXAPRO) 20 MG tablet Take 20 mg by mouth daily.        .  famotidine (PEPCID) 20 MG tablet Take 20 mg by mouth daily.        . fenofibrate 160 MG tablet Take 160 mg by mouth daily.        . ferrous gluconate (FERGON) 325 MG tablet Take 325 mg by mouth daily with breakfast.        . fluticasone (FLONASE) 50 MCG/ACT nasal spray 2 puffs by Nasal route Daily.      Marland Kitchen glucagon (GLUCAGEN) 1 MG SOLR 1 mg. As Directed        . loperamide (IMODIUM) 2 MG capsule Take 1 mg by mouth 2 (two) times daily.       . medroxyPROGESTERone (DEPO-PROVERA) 150 MG/ML injection Inject 150 mg into the muscle every 3 (three) months.        . Multiple Vitamin (MULTIVITAMIN) capsule Take 1 capsule by mouth daily.        . ramipril (ALTACE) 2.5 MG capsule Take 2 capsules (5 mg total) by mouth daily.  60 capsule  3    Allergies  Allergen Reactions  . Altace  Cough  . Aspartame   . Clarithromycin   . Codeine   . Mesalamine   . Sulfonamide Derivatives     Family History  Problem Relation Age of Onset  . Cancer Father     BP 130/80  Pulse 96  Temp(Src) 97.8 F (36.6 C) (Oral)  Ht 5' 2"  (1.575 m)  Wt 167 lb 8 oz (75.978 kg)  BMI 30.64 kg/m2  SpO2 99%  Review of Systems denies hypoglycemia.    Objective:   Physical Exam Pulses: dorsalis pedis intact bilat.   Feet: no deformity.  no ulcer on the feet.  feet are of normal color and temp.  no edema Neuro: sensation is intact to touch on the feet  Lab Results  Component Value Date   HGBA1C 8.4* 10/07/2011      Assessment & Plan:  DM, needs increased rx.

## 2011-11-07 ENCOUNTER — Telehealth: Payer: Self-pay | Admitting: Endocrinology

## 2011-11-07 NOTE — Telephone Encounter (Signed)
Received 4 pages sent to Dr. Loanne Drilling. SD 11/07/11.

## 2011-11-07 NOTE — Telephone Encounter (Deleted)
Received 4 pages sent to Dr. Loanne Drilling. SD 11/07/11.

## 2011-11-17 ENCOUNTER — Other Ambulatory Visit: Payer: Self-pay | Admitting: Internal Medicine

## 2012-01-06 ENCOUNTER — Other Ambulatory Visit (INDEPENDENT_AMBULATORY_CARE_PROVIDER_SITE_OTHER): Payer: BC Managed Care – PPO

## 2012-01-06 ENCOUNTER — Encounter: Payer: Self-pay | Admitting: Endocrinology

## 2012-01-06 ENCOUNTER — Ambulatory Visit (INDEPENDENT_AMBULATORY_CARE_PROVIDER_SITE_OTHER): Payer: BC Managed Care – PPO | Admitting: Endocrinology

## 2012-01-06 VITALS — BP 132/84 | HR 97 | Temp 97.8°F | Ht 62.0 in | Wt 169.0 lb

## 2012-01-06 DIAGNOSIS — E109 Type 1 diabetes mellitus without complications: Secondary | ICD-10-CM

## 2012-01-06 NOTE — Progress Notes (Signed)
Subjective:    Patient ID: Rhonda Gutierrez, female    DOB: 07/26/1961, 51 y.o.   MRN: [id number]  HPI Pt returns for f/u of type 1 DM (dx'ed 2008--complicated by renal insufficiency).  no cbg record, but she says she has had to reduce boluses to 30 units, due to hypoglycemia.  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  She averages 170 units per day, via her pump.   Past Medical History  Diagnosis Date  . Ulcerative colitis, unspecified   . Type I (juvenile type) diabetes mellitus without mention of complication, not stated as uncontrolled   . Unspecified essential hypertension   . Fatty liver disease, nonalcoholic   . Dyslipidemia     Past Surgical History  Procedure Date  . Nasal sinus surgery 02/10/1994  . Appendectomy   . Protocolectomy 12/08/2005    total  . Ileostomy 06/17/2006    take down  . Insulin pump     History   Social History  . Marital Status: Married    Spouse Name: N/A    Number of Children: N/A  . Years of Education: N/A   Occupational History  . 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     Social History Main Topics  . Smoking status: Never Smoker   . Smokeless tobacco: Not on file  . Alcohol Use: Not on file  . Drug Use: Not on file  . Sexually Active: Not on file   Other Topics Concern  . 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 She believes that when she returns to work with much more activity, her glucoses will come down some more.    Current Outpatient Prescriptions on File Prior to Visit  Medication Sig Dispense Refill  . aspirin 81 MG tablet Take 81 mg by mouth daily.        Marland Kitchen atorvastatin (LIPITOR) 80 MG tablet Take 80 mg by mouth daily.        . cloNIDine (CATAPRES) 0.1 MG tablet Take 0.1 mg by mouth at bedtime.        Marland Kitchen escitalopram (LEXAPRO) 20 MG tablet Take 20 mg by mouth daily.        . famotidine  (PEPCID) 20 MG tablet Take 20 mg by mouth daily.        . fenofibrate 160 MG tablet Take 160 mg by mouth daily.        . ferrous gluconate (FERGON) 325 MG tablet Take 325 mg by mouth daily with breakfast.        . fluticasone (FLONASE) 50 MCG/ACT nasal spray Place 2 puffs into the nose as needed.       Marland Kitchen glucagon (GLUCAGEN) 1 MG SOLR 1 mg. As Directed        . loperamide (IMODIUM) 2 MG capsule Take 1 mg by mouth 2 (two) times daily.       Marland Kitchen loratadine (CLARITIN) 10 MG tablet Take 10 mg by mouth daily.      . medroxyPROGESTERone (DEPO-PROVERA) 150 MG/ML injection Inject 150 mg into the muscle every 3 (three) months.        . Multiple Vitamin (MULTIVITAMIN) capsule Take 1 capsule by mouth daily.        Marland Kitchen RANITIDINE HCL PO Take 1 tablet by mouth daily. For acid reflux      .  DISCONTD: insulin lispro (HUMALOG) 100 UNIT/ML injection For use in pen, total of 175 units per day  60 mL  12  . DISCONTD: ramipril (ALTACE) 2.5 MG capsule Further refills through PCP.  60 capsule  0    Allergies  Allergen Reactions  . Aspartame   . Clarithromycin   . Codeine   . Mesalamine   . Sulfonamide Derivatives     Family History  Problem Relation Age of Onset  . Cancer Father     BP 132/84  Pulse 97  Temp(Src) 97.8 F (36.6 C) (Oral)  Ht 5' 2"  (1.575 m)  Wt 169 lb (76.658 kg)  BMI 30.91 kg/m2  SpO2 97%  Review of Systems Denies loc    Objective:   Physical Exam VITAL SIGNS:  See vs page GENERAL: no distress SKIN:  Insulin infuon sites at the anterior abdomen are normal   Lab Results  Component Value Date   HGBA1C 8.4* 01/06/2012      Assessment & Plan:  Type 1 DM.  needs increased rx

## 2012-01-06 NOTE — Patient Instructions (Addendum)
blood tests are being requested for you today.  You will receive a letter with results. pending the test results, please: continue basal rate to 2.3 units/hr, except for 7 units/hr, 3 am to 6 am continue correction bolus (which some people call "sensitivity," or "insulin sensitivity ratio," or just "isr") of 1 unit for each 25 by which your glucose exceeds 115. return 3 mos.   Continue pre-meal boluses of 30 units.   check your blood sugar 6 times a day.  vary the time of day when you check, between before the 3 meals, and at bedtime.  also check if you have symptoms of your blood sugar being too high or too low.  please keep a record of the readings and bring it to your next appointment here.  please call us sooner if your blood sugar goes below 70, or if it stays over 200. (update:  i need cbg info in order to safely increase insulin)

## 2012-04-06 ENCOUNTER — Encounter: Payer: Self-pay | Admitting: Endocrinology

## 2012-04-06 ENCOUNTER — Ambulatory Visit (INDEPENDENT_AMBULATORY_CARE_PROVIDER_SITE_OTHER): Payer: BC Managed Care – PPO | Admitting: Endocrinology

## 2012-04-06 ENCOUNTER — Other Ambulatory Visit (INDEPENDENT_AMBULATORY_CARE_PROVIDER_SITE_OTHER): Payer: BC Managed Care – PPO

## 2012-04-06 VITALS — BP 136/88 | HR 94 | Temp 98.3°F | Wt 169.0 lb

## 2012-04-06 DIAGNOSIS — E109 Type 1 diabetes mellitus without complications: Secondary | ICD-10-CM

## 2012-04-06 NOTE — Progress Notes (Signed)
Subjective:    Patient ID: Rhonda Gutierrez, female    DOB: 1961/04/24, 51 y.o.   MRN: [id number]  HPI Pt returns for f/u of type 1 DM (dx'ed 2008--complicated by renal insufficiency; she has been on pump rx since 2008; she has been turned down for continuous glucose monitor twice by [employer]).  She has mild hypoglycemia approx once/6 weeks.  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  She averages 180 units per day, via her pump.  cbg's are over 200 approx almost daily.  This happens with a larger-than-expected meal.   Past Medical History  Diagnosis Date  . Ulcerative colitis, unspecified   . Type I (juvenile type) diabetes mellitus without mention of complication, not stated as uncontrolled   . Unspecified essential hypertension   . Fatty liver disease, nonalcoholic   . Dyslipidemia     Past Surgical History  Procedure Date  . Nasal sinus surgery 02/10/1994  . Appendectomy   . Protocolectomy 12/08/2005    total  . Ileostomy 06/17/2006    take down  . Insulin pump     History   Social History  . Marital Status: Married    Spouse Name: N/A    Number of Children: N/A  . Years of Education: N/A   Occupational History  . 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     Social History Main Topics  . Smoking status: Never Smoker   . Smokeless tobacco: Not on file  . Alcohol Use: Not on file  . Drug Use: Not on file  . Sexually Active: Not on file   Other Topics Concern  . 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 She believes that when she returns to work with much more activity, her glucoses will come down some more.    Current Outpatient Prescriptions on File Prior to Visit  Medication Sig Dispense Refill  . aspirin 81 MG tablet Take 81 mg by mouth daily.        Marland Kitchen atorvastatin (LIPITOR) 80 MG tablet Take 80 mg by mouth daily.        .  cloNIDine (CATAPRES) 0.1 MG tablet Take 0.1 mg by mouth at bedtime.        Marland Kitchen escitalopram (LEXAPRO) 20 MG tablet Take 20 mg by mouth daily.        . famotidine (PEPCID) 20 MG tablet Take 20 mg by mouth daily.        . fenofibrate 160 MG tablet Take 160 mg by mouth daily.        . ferrous gluconate (FERGON) 325 MG tablet Take 325 mg by mouth daily with breakfast.        . glucagon (GLUCAGEN) 1 MG SOLR 1 mg. As Directed        . Insulin Infusion Pump Supplies (INSET INFUSION SET 23" 9MM) MISC 1 Device by Does not apply route daily.      . Insulin Infusion Pump Supplies (INSULIN PUMP SYRINGE RESERVOIR) MISC 1 Device by Does not apply route daily.      . insulin lispro (HUMALOG) 100 UNIT/ML injection For use in pump total of 175 units daily      . loperamide (IMODIUM) 2 MG capsule Take 1 mg by mouth 2 (two) times daily.       Marland Kitchen  loratadine (CLARITIN) 10 MG tablet Take 10 mg by mouth daily.      . medroxyPROGESTERone (DEPO-PROVERA) 150 MG/ML injection Inject 150 mg into the muscle every 3 (three) months.        . Multiple Vitamin (MULTIVITAMIN) capsule Take 1 capsule by mouth daily.        Earney Navy Bicarbonate (ZEGERID) 20-1100 MG CAPS Take 1 capsule by mouth daily.      . ramipril (ALTACE) 2.5 MG capsule Take 2.5 mg by mouth 2 (two) times daily. Further refills through PCP.        Allergies  Allergen Reactions  . Aspartame   . Clarithromycin   . Codeine   . Mesalamine   . Sulfonamide Derivatives     Family History  Problem Relation Age of Onset  . Cancer Father     BP 136/88  Pulse 94  Temp 98.3 F (36.8 C) (Oral)  Wt 169 lb (76.658 kg)  SpO2 97%  Review of Systems Denies LOC    Objective:   Physical Exam VITAL SIGNS:  See vs page GENERAL: no distress Pulses: dorsalis pedis intact bilat.   Feet: no deformity.  no ulcer on the feet.  feet are of normal color and temp.  no edema Neuro: sensation is intact to touch on the feet.    Lab Results  Component Value  Date   HGBA1C 8.4* 04/06/2012      Assessment & Plan:  DM.  needs increased rx

## 2012-04-06 NOTE — Patient Instructions (Addendum)
blood tests are being requested for you today.  You will receive a letter with results. pending the test results, please: continue basal rate to 2.3 units/hr, except for 7 units/hr, 3 am to 6 am continue correction bolus (which some people call "sensitivity," or "insulin sensitivity ratio," or just "isr") of 1 unit for each 25 by which your glucose exceeds 115. return 3 mos.   Continue pre-meal boluses of 30 units.   check your blood sugar 6 times a day.  vary the time of day when you check, between before the 3 meals, and at bedtime.  also check if you have symptoms of your blood sugar being too high or too low.  please keep a record of the readings and bring it to your next appointment here.  please call us sooner if your blood sugar goes below 70, or if it stays over 200.   If your a1c is high, let's increase the basal rate.

## 2012-04-08 ENCOUNTER — Telehealth: Payer: Self-pay | Admitting: *Deleted

## 2012-04-08 NOTE — Telephone Encounter (Signed)
Called pt to inform of lab results, pt informed via VM and to callback office with any questions/concerns (letter also mailed to pt).

## 2012-05-21 ENCOUNTER — Telehealth: Payer: Self-pay

## 2012-05-21 MED ORDER — INSULIN LISPRO 100 UNIT/ML ~~LOC~~ SOLN: SUBCUTANEOUS | Status: DC

## 2012-05-21 NOTE — Telephone Encounter (Signed)
Pt called requesting updated Rx for insulin to use in her pump.

## 2012-07-13 ENCOUNTER — Ambulatory Visit: Payer: BC Managed Care – PPO | Admitting: Endocrinology

## 2012-07-20 ENCOUNTER — Encounter: Payer: Self-pay | Admitting: Endocrinology

## 2012-07-20 ENCOUNTER — Ambulatory Visit (INDEPENDENT_AMBULATORY_CARE_PROVIDER_SITE_OTHER): Payer: BC Managed Care – PPO | Admitting: Endocrinology

## 2012-07-20 VITALS — BP 130/78 | HR 66 | Temp 98.3°F | Wt 165.0 lb

## 2012-07-20 DIAGNOSIS — E109 Type 1 diabetes mellitus without complications: Secondary | ICD-10-CM

## 2012-07-20 LAB — HEMOGLOBIN A1C: Mean Plasma Glucose: 169 mg/dL — ABNORMAL HIGH (ref <117)

## 2012-07-20 NOTE — Patient Instructions (Addendum)
blood tests are being requested for you today.  You will receive a letter with results. pending the test results, please: continue basal rate of 3 units/hr, except for 7 units/hr, 3 am to 6 am.  continue correction bolus (which some people call "sensitivity," or "insulin sensitivity ratio," or just "isr") of 1 unit for each 25 by which your glucose exceeds 115. return 3 mos.   Continue pre-meal boluses of 30 units.   check your blood sugar 6 times a day.  vary the time of day when you check, between before the 3 meals, and at bedtime.  also check if you have symptoms of your blood sugar being too high or too low.  please keep a record of the readings and bring it to your next appointment here.  please call us sooner if your blood sugar goes below 70, or if it stays over 200.

## 2012-07-20 NOTE — Progress Notes (Signed)
Subjective:    Patient ID: Rhonda Gutierrez, female    DOB: [birth year], 51 y.o.   MRN: [id number]  HPI Pt returns for f/u of type 1 DM (dx'ed 2008--complicated by renal insufficiency; she has been on pump rx since 2008; she has been turned down for continuous glucose monitor twice by [employer]).  she brings a record of her cbg's which i have reviewed today.  Since on the increased basal rate, she has had mild hypoglycemia (70's) a few times per week.  There is no trend throughout the day, except the lows are most common before lunch.  She averages 180 units per day, via her pump.  This happens with a larger-than-expected meal.   Pt was admitted at [facility] last week with GI illness--she is better now.   Past Medical History  Diagnosis Date  . Ulcerative colitis, unspecified   . Type I (juvenile type) diabetes mellitus without mention of complication, not stated as uncontrolled   . Unspecified essential hypertension   . Fatty liver disease, nonalcoholic   . Dyslipidemia     Past Surgical History  Procedure Date  . Nasal sinus surgery 02/10/1994  . Appendectomy   . Protocolectomy 12/08/2005    total  . Ileostomy 06/17/2006    take down  . Insulin pump     History   Social History  . Marital Status: Married    Spouse Name: N/A    Number of Children: N/A  . Years of Education: N/A   Occupational History  . 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     Social History Main Topics  . Smoking status: Never Smoker   . Smokeless tobacco: Not on file  . Alcohol Use: Not on file  . Drug Use: Not on file  . Sexually Active: Not on file   Other Topics Concern  . 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 She believes that when she returns to work with much more activity, her glucoses will come down some more.    Current Outpatient Prescriptions on File Prior to Visit  Medication Sig  Dispense Refill  . aspirin 81 MG tablet Take 81 mg by mouth daily.        Marland Kitchen atorvastatin (LIPITOR) 80 MG tablet Take 80 mg by mouth daily.        . cloNIDine (CATAPRES) 0.1 MG tablet Take 0.1 mg by mouth at bedtime.        Marland Kitchen escitalopram (LEXAPRO) 20 MG tablet Take 20 mg by mouth daily.        . famotidine (PEPCID) 20 MG tablet Take 20 mg by mouth daily.        . fenofibrate 160 MG tablet Take 160 mg by mouth daily.        . ferrous gluconate (FERGON) 325 MG tablet Take 325 mg by mouth daily with breakfast.        . glucagon (GLUCAGEN) 1 MG SOLR 1 mg. As Directed        . Insulin Infusion Pump Supplies (INSET INFUSION SET 23" 9MM) MISC 1 Device by Does not apply route daily.      . Insulin Infusion Pump Supplies (INSULIN PUMP SYRINGE RESERVOIR) MISC 1 Device by Does not apply route daily.      . insulin lispro (HUMALOG) 100 UNIT/ML injection For use in pump total  of 200 units daily  70 mL  5  . loperamide (IMODIUM) 2 MG capsule Take 1 mg by mouth 2 (two) times daily.       Marland Kitchen loratadine (CLARITIN) 10 MG tablet Take 10 mg by mouth daily.      . medroxyPROGESTERone (DEPO-PROVERA) 150 MG/ML injection Inject 150 mg into the muscle every 3 (three) months.        . Multiple Vitamin (MULTIVITAMIN) capsule Take 1 capsule by mouth daily.        Earney Navy Bicarbonate (ZEGERID) 20-1100 MG CAPS Take 1 capsule by mouth daily.      . ramipril (ALTACE) 2.5 MG capsule Take 2.5 mg by mouth 2 (two) times daily. Further refills through PCP.        Allergies  Allergen Reactions  . Aspartame   . Clarithromycin   . Codeine   . Mesalamine   . Sulfonamide Derivatives     Family History  Problem Relation Age of Onset  . Cancer Father     BP 130/78  Pulse 66  Temp 98.3 F (36.8 C) (Oral)  Wt 165 lb (74.844 kg)  SpO2 98%   Review of Systems Denies LOC    Objective:   Physical Exam SKIN:  Insulin infusion sites at the anterior abdomen are normal.   Pulses: dorsalis pedis intact bilat.     Feet: no deformity.  no ulcer on the feet.  feet are of normal color and temp.  no edema Neuro: sensation is intact to touch on the feet.     Lab Results  Component Value Date   HGBA1C 7.5* 07/20/2012      Assessment & Plan:  DM, improved, but based on the pattern of her cbg's, she needs some adjustment in her therapy

## 2012-08-10 ENCOUNTER — Telehealth: Payer: Self-pay | Admitting: *Deleted

## 2012-08-10 NOTE — Telephone Encounter (Signed)
FORMS FOR [place] GLUCOSE MONITORING FAXED

## 2012-10-12 ENCOUNTER — Encounter: Payer: Self-pay | Admitting: Endocrinology

## 2012-10-12 ENCOUNTER — Ambulatory Visit (INDEPENDENT_AMBULATORY_CARE_PROVIDER_SITE_OTHER): Payer: BC Managed Care – PPO | Admitting: Endocrinology

## 2012-10-12 VITALS — BP 126/74 | HR 90 | Wt 169.0 lb

## 2012-10-12 DIAGNOSIS — E109 Type 1 diabetes mellitus without complications: Secondary | ICD-10-CM

## 2012-10-12 LAB — MICROALBUMIN / CREATININE URINE RATIO: Microalb Creat Ratio: 52.6 mg/g — ABNORMAL HIGH (ref 0.0–30.0)

## 2012-10-12 MED ORDER — PARADIGM PUMP RESERVOIR 3ML MISC: 1.0000 | Status: DC

## 2012-10-12 MED ORDER — "PARADIGM QUICK-SET 32"" 9MM MISC": 1.0000 | Status: DC

## 2012-10-12 NOTE — Progress Notes (Signed)
Subjective:    Patient ID: Rhonda Gutierrez, female    DOB: 01/05/1961, 52 y.o.   MRN: [id number]  HPI Pt returns for f/u of type 1 DM (dx'ed 2008--complicated by renal insufficiency; she has been on pump rx since 2008, and continuous glucose monitor since 2014; she has never had severe hypoglycemia or DKA).  She averages 175 units total per day, via her pump.  She has a new continuous glucose monitor, but has not yet uploaded the info.  she brings a record of her cbg's which i have reviewed today.  It varies from 60's to 200, but most are in the 100's. There is no trend throughout the day.  Pt says she wants more time to get used to her new continuous glucose monitor, before making any changes.   Past Medical History  Diagnosis Date  . Ulcerative colitis, unspecified   . Type I (juvenile type) diabetes mellitus without mention of complication, not stated as uncontrolled   . Unspecified essential hypertension   . Fatty liver disease, nonalcoholic   . Dyslipidemia     Past Surgical History  Procedure Laterality Date  . Nasal sinus surgery  02/10/1994  . Appendectomy    . Protocolectomy  12/08/2005    total  . Ileostomy  06/17/2006    take down  . Insulin pump      History   Social History  . Marital Status: Married    Spouse Name: N/A    Number of Children: N/A  . Years of Education: N/A   Occupational History  . 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     Social History Main Topics  . Smoking status: Never Smoker   . Smokeless tobacco: Not on file  . Alcohol Use: Not on file  . Drug Use: Not on file  . Sexually Active: Not on file   Other Topics Concern  . 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 She believes that when she returns to work with much more activity, her glucoses will come down some more.    Current Outpatient Prescriptions on File Prior to Visit  Medication  Sig Dispense Refill  . aspirin 81 MG tablet Take 81 mg by mouth daily.        Marland Kitchen atorvastatin (LIPITOR) 80 MG tablet Take 80 mg by mouth daily.        . cloNIDine (CATAPRES) 0.1 MG tablet Take 0.1 mg by mouth at bedtime.        Marland Kitchen escitalopram (LEXAPRO) 20 MG tablet Take 20 mg by mouth daily.        . famotidine (PEPCID) 20 MG tablet Take 20 mg by mouth daily.        . fenofibrate 160 MG tablet Take 160 mg by mouth daily.        . ferrous gluconate (FERGON) 325 MG tablet Take 325 mg by mouth daily with breakfast.        . glucagon (GLUCAGEN) 1 MG SOLR 1 mg. As Directed        . insulin lispro (HUMALOG) 100 UNIT/ML injection For use in pump total of 200 units daily  70 mL  5  . loperamide (IMODIUM) 2 MG capsule Take 1 mg by mouth 2 (two) times daily.       Marland Kitchen loratadine (CLARITIN) 10 MG tablet Take 10 mg  by mouth daily.      . medroxyPROGESTERone (DEPO-PROVERA) 150 MG/ML injection Inject 150 mg into the muscle every 3 (three) months.        . Multiple Vitamin (MULTIVITAMIN) capsule Take 1 capsule by mouth daily.        Earney Navy Bicarbonate (ZEGERID) 20-1100 MG CAPS Take 1 capsule by mouth daily.      . ramipril (ALTACE) 2.5 MG capsule Take 2.5 mg by mouth 2 (two) times daily. Further refills through PCP.       No current facility-administered medications on file prior to visit.    Allergies  Allergen Reactions  . Aspartame   . Clarithromycin   . Codeine   . Mesalamine   . Sulfonamide Derivatives     Family History  Problem Relation Age of Onset  . Cancer Father     BP 126/74  Pulse 90  Wt 169 lb (76.658 kg)  BMI 30.9 kg/m2  SpO2 98%  Review of Systems Denies LOC    Objective:   Physical Exam VITAL SIGNS:  See vs page GENERAL: no distress. SKIN:  Insulin infusion and continuous glucose monitor sites at the anterior abdomen are normal   Lab Results  Component Value Date   HGBA1C 7.5* 10/12/2012      Assessment & Plan:  DM: she needs increased rx, but she  wants to get used to her continuous glucose monitor first.  i said that was ok with me.

## 2012-10-12 NOTE — Patient Instructions (Addendum)
blood tests are being requested for you today.  We'll contact you with results.   pending the test results, please: continue basal rate of 3 units/hr, except for 7 units/hr, 3 am to 6 am.  continue correction bolus (which some people call "sensitivity," or "insulin sensitivity ratio," or just "isr") of 1 unit for each 25 by which your glucose exceeds 115. return 3 mos.   Continue pre-meal boluses of 30 units.   check your blood sugar 6 times a day.  vary the time of day when you check, between before the 3 meals, and at bedtime.  also check if you have symptoms of your blood sugar being too high or too low.  please keep a record of the readings and bring it to your next appointment here.  please call us sooner if your blood sugar goes below 70, or if it stays over 200.

## 2012-10-16 ENCOUNTER — Other Ambulatory Visit: Payer: Self-pay

## 2012-11-15 ENCOUNTER — Other Ambulatory Visit: Payer: Self-pay | Admitting: *Deleted

## 2012-11-15 MED ORDER — INSULIN LISPRO 100 UNIT/ML ~~LOC~~ SOLN: SUBCUTANEOUS | Status: DC

## 2012-11-16 ENCOUNTER — Other Ambulatory Visit: Payer: Self-pay

## 2012-11-18 ENCOUNTER — Telehealth: Payer: Self-pay | Admitting: Endocrinology

## 2012-11-18 NOTE — Telephone Encounter (Signed)
The patient called because she received a "missed call" from our office.  The patient is waiting on Dr. Loanne Drilling to approve two medications and is wondering if that is what the call was regarding.  Please call the patient at [phone] or [phone] if needed.

## 2012-11-19 NOTE — Telephone Encounter (Signed)
Left message no record of calling pt

## 2012-11-29 ENCOUNTER — Telehealth: Payer: Self-pay

## 2012-11-29 ENCOUNTER — Other Ambulatory Visit: Payer: Self-pay | Admitting: Endocrinology

## 2012-11-29 MED ORDER — INSULIN LISPRO 100 UNIT/ML ~~LOC~~ SOLN: SUBCUTANEOUS | Status: DC

## 2012-11-29 NOTE — Telephone Encounter (Signed)
Pt stated that she uses humalog kwik pen for back up, what should be the correct does to refill, [facility] in [place], not [place]

## 2012-11-29 NOTE — Telephone Encounter (Signed)
i sent rx

## 2013-01-06 ENCOUNTER — Telehealth: Payer: Self-pay | Admitting: Endocrinology

## 2013-01-06 NOTE — Telephone Encounter (Signed)
Pt concerned that the faxes from [facility] are not coming through.They told her that they have not received any faxes back from Dr Loanne Drilling for a refill for sensors for her insulin pump. Advised pt that I do not remember seeing anything come through the fax machine. Advised her to contact them and be sure that they have the correct fax number (gave her our fax number) and ask them to re-fax it to [place]. Pt stated she would do that.

## 2013-01-06 NOTE — Telephone Encounter (Signed)
Patient is requesting a CB at either her home or her cell. She needs her diabetic supplies.

## 2013-01-11 ENCOUNTER — Ambulatory Visit (INDEPENDENT_AMBULATORY_CARE_PROVIDER_SITE_OTHER): Payer: BC Managed Care – PPO | Admitting: Endocrinology

## 2013-01-11 ENCOUNTER — Encounter: Payer: Self-pay | Admitting: Endocrinology

## 2013-01-11 VITALS — BP 132/70 | HR 78 | Ht 63.0 in | Wt 167.0 lb

## 2013-01-11 DIAGNOSIS — E109 Type 1 diabetes mellitus without complications: Secondary | ICD-10-CM

## 2013-01-11 DIAGNOSIS — E1029 Type 1 diabetes mellitus with other diabetic kidney complication: Secondary | ICD-10-CM

## 2013-01-11 LAB — HEMOGLOBIN A1C: Hgb A1c MFr Bld: 7.9 % — ABNORMAL HIGH (ref 4.6–6.5)

## 2013-01-11 NOTE — Patient Instructions (Addendum)
blood tests are being requested for you today.  We'll contact you with results.   pending the test results, please: continue basal rate of 3 units/hr, except for 7 units/hr, 3 am to 6 am.  continue correction bolus (which some people call "sensitivity," or "insulin sensitivity ratio," or just "isr") of 1 unit for each 25 by which your glucose exceeds 115. return 3 mos.   Continue pre-meal boluses of 30 units.   check your blood sugar 6 times a day.  vary the time of day when you check, between before the 3 meals, and at bedtime.  also check if you have symptoms of your blood sugar being too high or too low.  please keep a record of the readings and bring it to your next appointment here.  please call us sooner if your blood sugar goes below 70, or if it stays over 200.

## 2013-01-11 NOTE — Progress Notes (Signed)
Subjective:    Patient ID: Rhonda Gutierrez, female    DOB: 03-29-1961, 52 y.o.   MRN: [id number]  HPI Pt returns for f/u of type 1 DM (dx'ed 2008--complicated by renal insufficiency; she has been on pump rx since 2008, and continuous glucose monitor since 2014; she has never had severe hypoglycemia or DKA).  She averages 175 units total per day, via her pump.  She has a new continuous glucose monitor, but has not recently uploaded the info. no cbg record, but states cbg's are well-controlled. There is no trend throughout the day.   Past Medical History  Diagnosis Date  . Ulcerative colitis, unspecified   . Type I (juvenile type) diabetes mellitus without mention of complication, not stated as uncontrolled   . Unspecified essential hypertension   . Fatty liver disease, nonalcoholic   . Dyslipidemia     Past Surgical History  Procedure Laterality Date  . Nasal sinus surgery  02/10/1994  . Appendectomy    . Protocolectomy  12/08/2005    total  . Ileostomy  06/17/2006    take down  . Insulin pump      History   Social History  . Marital Status: Married    Spouse Name: N/A    Number of Children: N/A  . Years of Education: N/A   Occupational History  . 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     Social History Main Topics  . Smoking status: Never Smoker   . Smokeless tobacco: Not on file  . Alcohol Use: Not on file  . Drug Use: Not on file  . Sexually Active: Not on file   Other Topics Concern  . 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 She believes that when she returns to work with much more activity, her glucoses will come down some more.    Current Outpatient Prescriptions on File Prior to Visit  Medication Sig Dispense Refill  . aspirin 81 MG tablet Take 81 mg by mouth daily.        Marland Kitchen atorvastatin (LIPITOR) 80 MG tablet Take 80 mg by mouth daily.        . cloNIDine  (CATAPRES) 0.1 MG tablet Take 0.1 mg by mouth at bedtime.        Marland Kitchen escitalopram (LEXAPRO) 20 MG tablet Take 20 mg by mouth daily.        . famotidine (PEPCID) 20 MG tablet Take 20 mg by mouth daily.        . fenofibrate 160 MG tablet Take 160 mg by mouth daily.        . ferrous gluconate (FERGON) 325 MG tablet Take 325 mg by mouth daily with breakfast.        . glucagon (GLUCAGEN) 1 MG SOLR 1 mg. As Directed        . Insulin Infusion Pump Supplies (PARADIGM QUICK-SET 32" 9MM) MISC 1 Device by Does not apply route every 3 (three) days.  30 each  3  . Insulin Infusion Pump Supplies (PARADIGM RESERVOIR 3ML) MISC 1 Device by Does not apply route every 3 (three) days.  10 each  3  . insulin lispro (HUMALOG KWIKPEN) 100 UNIT/ML injection Use as needed, if pump does not work, and pen needles # 30  15 mL  12  . insulin lispro (HUMALOG) 100 UNIT/ML injection For use in  pump total of 200 units daily  70 mL  5  . loperamide (IMODIUM) 2 MG capsule Take 1 mg by mouth 2 (two) times daily.       Marland Kitchen loratadine (CLARITIN) 10 MG tablet Take 10 mg by mouth daily.      . medroxyPROGESTERone (DEPO-PROVERA) 150 MG/ML injection Inject 150 mg into the muscle every 3 (three) months.        . Multiple Vitamin (MULTIVITAMIN) capsule Take 1 capsule by mouth daily.        Earney Navy Bicarbonate (ZEGERID) 20-1100 MG CAPS Take 1 capsule by mouth daily.      . ramipril (ALTACE) 2.5 MG capsule Take 2.5 mg by mouth 2 (two) times daily. Further refills through PCP.       No current facility-administered medications on file prior to visit.    Allergies  Allergen Reactions  . Aspartame   . Clarithromycin   . Codeine   . Mesalamine   . Sulfonamide Derivatives     Family History  Problem Relation Age of Onset  . Cancer Father     BP 132/70  Pulse 78  Ht 5' 3"  (1.6 m)  Wt 167 lb (75.751 kg)  BMI 29.59 kg/m2  SpO2 98%  Review of Systems Denies LOC    Objective:   Physical Exam VITAL SIGNS:  See vs  page GENERAL: no distress   Lab Results  Component Value Date   HGBA1C 7.9* 01/11/2013      Assessment & Plan:  DM: needs increased rx

## 2013-01-14 ENCOUNTER — Telehealth: Payer: Self-pay

## 2013-01-14 NOTE — Telephone Encounter (Signed)
please call patient: A1c=7.9 Your blood sugar is the same. In order to safely get it better, please upload the continuous glucose monitor prior to your next office visit.

## 2013-01-14 NOTE — Telephone Encounter (Signed)
Pt called requesting lab results [phone]

## 2013-01-14 NOTE — Telephone Encounter (Signed)
LVOM pt to call if any questions

## 2013-02-11 ENCOUNTER — Telehealth: Payer: Self-pay | Admitting: *Deleted

## 2013-02-11 NOTE — Telephone Encounter (Signed)
Pt called requesting [place] to call pharmacy, [place] [phone] and verify dosage increase of her insulin for her pump. Pt is now doing 230 units a day. Called and verified with pharmacy.

## 2013-04-12 ENCOUNTER — Ambulatory Visit: Payer: BC Managed Care – PPO | Admitting: Endocrinology

## 2013-04-19 ENCOUNTER — Ambulatory Visit: Payer: BC Managed Care – PPO | Admitting: Endocrinology

## 2013-05-03 ENCOUNTER — Ambulatory Visit (INDEPENDENT_AMBULATORY_CARE_PROVIDER_SITE_OTHER): Payer: BC Managed Care – PPO | Admitting: Endocrinology

## 2013-05-03 VITALS — BP 132/80 | HR 90 | Ht 62.0 in | Wt 166.0 lb

## 2013-05-03 DIAGNOSIS — M25579 Pain in unspecified ankle and joints of unspecified foot: Secondary | ICD-10-CM

## 2013-05-03 DIAGNOSIS — E1029 Type 1 diabetes mellitus with other diabetic kidney complication: Secondary | ICD-10-CM

## 2013-05-03 NOTE — Progress Notes (Signed)
Subjective:    Patient ID: Rhonda Gutierrez, female    DOB: 21-Apr-1961, 52 y.o.   MRN: [id number]  HPI Pt returns for f/u of type 1 DM (dx'ed 2008--complicated by renal insufficiency; she has been on pump rx since 2008, and continuous glucose monitor since 2014; however, she can no longer afford continuous glucose monitor supplies; she has never had severe hypoglycemia or DKA).  She averages 175 units total per day, via her pump.  She has mild hypoglycemia approx twice a month, usually with activity.  There is no trend throughout the day, except it is higher at hs than in am.   She has bilat foot pain. Past Medical History  Diagnosis Date  . Ulcerative colitis, unspecified   . Type I (juvenile type) diabetes mellitus without mention of complication, not stated as uncontrolled   . Unspecified essential hypertension   . Fatty liver disease, nonalcoholic   . Dyslipidemia     Past Surgical History  Procedure Laterality Date  . Nasal sinus surgery  02/10/1994  . Appendectomy    . Protocolectomy  12/08/2005    total  . Ileostomy  06/17/2006    take down  . Insulin pump      History   Social History  . Marital Status: Married    Spouse Name: N/A    Number of Children: N/A  . Years of Education: N/A   Occupational History  . 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     Social History Main Topics  . Smoking status: Never Smoker   . Smokeless tobacco: Not on file  . Alcohol Use: Not on file  . Drug Use: Not on file  . Sexual Activity: Not on file   Other Topics Concern  . 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 She believes that when she returns to work with much more activity, her glucoses will come down some more.    Current Outpatient Prescriptions on File Prior to Visit  Medication Sig Dispense Refill  . aspirin 81 MG tablet Take 81 mg by mouth daily.        Marland Kitchen atorvastatin  (LIPITOR) 80 MG tablet Take 80 mg by mouth daily.        . cloNIDine (CATAPRES) 0.1 MG tablet Take 0.1 mg by mouth at bedtime.        . Continuous Glucose Monitor DEVI Inject 1 Device into the skin once a week.      . escitalopram (LEXAPRO) 20 MG tablet Take 20 mg by mouth daily.        . famotidine (PEPCID) 20 MG tablet Take 20 mg by mouth daily.        . fenofibrate 160 MG tablet Take 160 mg by mouth daily.        . ferrous gluconate (FERGON) 325 MG tablet Take 325 mg by mouth daily with breakfast.        . glucagon (GLUCAGEN) 1 MG SOLR 1 mg. As Directed        . Insulin Infusion Pump Supplies (PARADIGM QUICK-SET 32" 9MM) MISC 1 Device by Does not apply route every 3 (three) days.  30 each  3  . Insulin Infusion Pump Supplies (PARADIGM RESERVOIR 3ML) MISC 1 Device by Does not apply route every 3 (three) days.  10 each  3  . insulin lispro (HUMALOG  KWIKPEN) 100 UNIT/ML injection Use as needed, if pump does not work, and pen needles # 30  15 mL  12  . insulin lispro (HUMALOG) 100 UNIT/ML injection For use in pump total of 200 units daily  70 mL  5  . loperamide (IMODIUM) 2 MG capsule Take 1 mg by mouth 2 (two) times daily.       Marland Kitchen loratadine (CLARITIN) 10 MG tablet Take 10 mg by mouth daily.      . medroxyPROGESTERone (DEPO-PROVERA) 150 MG/ML injection Inject 150 mg into the muscle every 3 (three) months.        . Multiple Vitamin (MULTIVITAMIN) capsule Take 1 capsule by mouth daily.        Earney Navy Bicarbonate (ZEGERID) 20-1100 MG CAPS Take 1 capsule by mouth daily.      . ramipril (ALTACE) 2.5 MG capsule Take 2.5 mg by mouth 2 (two) times daily. Further refills through PCP.       No current facility-administered medications on file prior to visit.    Allergies  Allergen Reactions  . Aspartame   . Clarithromycin   . Codeine   . Mesalamine   . Sulfonamide Derivatives    Family History  Problem Relation Age of Onset  . Cancer Father    BP 132/80  Pulse 90  Ht 5' 2"   (1.575 m)  Wt 166 lb (75.297 kg)  BMI 30.35 kg/m2  SpO2 97%  Review of Systems Denies LOC and weight change.      Objective:   Physical Exam VITAL SIGNS:  See vs page. GENERAL: no distress. SKIN:  Insulin infusion sites at the anterior abdomen are normal.    Lab Results  Component Value Date   HGBA1C 8.1* 05/03/2013      Assessment & Plan:  DM: This insulin pump regimen was chosen from multiple options, as it best matches her insulin to her changing requirements throughout the day.  The benefits of glycemic control must be weighed against the risks of hypoglycemia.  She needs increased rx.   Renal insufficiency: this increases the risk of hypoglycemia.  Foot pain, new, uncertain etiology.

## 2013-05-03 NOTE — Patient Instructions (Addendum)
blood tests are being requested for you today.  We'll contact you with results.   pending the test results, please: continue basal rate of 3 units/hr, except for 7 units/hr, 3 am to 6 am.  continue correction bolus (which some people call "sensitivity," or "insulin sensitivity ratio," or just "isr") of 1 unit for each 25 by which your glucose exceeds 115.  Continue pre-meal boluses of 30 units.   check your blood sugar 6 times a day.  vary the time of day when you check, between before the 3 meals, and at bedtime.  also check if you have symptoms of your blood sugar being too high or too low.  please keep a record of the readings and bring it to your next appointment here.  please call us sooner if your blood sugar goes below 70, or if it stays over 200.   Refer to a sports medicine specialist.  you will receive a phone call, about a day and time for an appointment.

## 2013-05-04 ENCOUNTER — Ambulatory Visit: Payer: BC Managed Care – PPO | Admitting: Family Medicine

## 2013-05-06 ENCOUNTER — Ambulatory Visit: Payer: BC Managed Care – PPO | Admitting: Family Medicine

## 2013-07-07 ENCOUNTER — Other Ambulatory Visit: Payer: Self-pay

## 2013-07-21 ENCOUNTER — Other Ambulatory Visit: Payer: Self-pay | Admitting: Endocrinology

## 2013-07-21 ENCOUNTER — Telehealth: Payer: Self-pay | Admitting: Endocrinology

## 2013-07-21 MED ORDER — INSULIN ASPART 100 UNIT/ML ~~LOC~~ SOLN: SUBCUTANEOUS | Status: DC

## 2013-07-21 NOTE — Telephone Encounter (Signed)
Left message on voicemail.

## 2013-07-21 NOTE — Telephone Encounter (Signed)
please call patient: i changed to novolog, and i have sent a prescription to your pharmacy

## 2013-08-09 ENCOUNTER — Ambulatory Visit: Payer: BC Managed Care – PPO | Admitting: Endocrinology

## 2013-08-10 ENCOUNTER — Ambulatory Visit: Payer: BC Managed Care – PPO | Admitting: Endocrinology

## 2013-09-06 ENCOUNTER — Ambulatory Visit (INDEPENDENT_AMBULATORY_CARE_PROVIDER_SITE_OTHER): Payer: BC Managed Care – PPO | Admitting: Endocrinology

## 2013-09-06 ENCOUNTER — Encounter: Payer: Self-pay | Admitting: Endocrinology

## 2013-09-06 VITALS — BP 118/66 | HR 84 | Temp 98.6°F | Ht 62.0 in | Wt 166.0 lb

## 2013-09-06 DIAGNOSIS — E1029 Type 1 diabetes mellitus with other diabetic kidney complication: Secondary | ICD-10-CM

## 2013-09-06 LAB — HEMOGLOBIN A1C: HEMOGLOBIN A1C: 8.2 % — AB (ref 4.6–6.5)

## 2013-09-06 NOTE — Progress Notes (Signed)
Subjective:    Patient ID: Rhonda Gutierrez, female    DOB: 12-17-1960, 53 y.o.   MRN: [id number]  HPI Pt returns for f/u of type 1 DM (dx'ed 2008; she has mild if any neuropathy of the lower extremities, but she has associated renal insufficiency; she has been on pump rx since 2008; however, she stopped continuous glucose monitor in 2014, because she could not afford supplies; she has never had severe hypoglycemia or DKA).  She averages 175 units total per day, via her pump.  She says her new job has compromised her ability to care for herself.  pt states she feels well in general. he brings a record of his cbg's which i have reviewed today.  It varies from 65-300, but most are in the 100's.  There is no trend throughout the day. Past Medical History  Diagnosis Date  . Ulcerative colitis, unspecified   . Type I (juvenile type) diabetes mellitus without mention of complication, not stated as uncontrolled   . Unspecified essential hypertension   . Fatty liver disease, nonalcoholic   . Dyslipidemia     Past Surgical History  Procedure Laterality Date  . Nasal sinus surgery  02/10/1994  . Appendectomy    . Protocolectomy  12/08/2005    total  . Ileostomy  06/17/2006    take down  . Insulin pump      History   Social History  . Marital Status: Married    Spouse Name: N/A    Number of Children: N/A  . Years of Education: N/A   Occupational History  . 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     Social History Main Topics  . Smoking status: Never Smoker   . Smokeless tobacco: Not on file  . Alcohol Use: Not on file  . Drug Use: Not on file  . Sexual Activity: Not on file   Other Topics Concern  . 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 She believes that when she returns to work with much more activity, her glucoses will come down some more.    Current Outpatient Prescriptions on  File Prior to Visit  Medication Sig Dispense Refill  . aspirin 81 MG tablet Take 81 mg by mouth daily.        Marland Kitchen atorvastatin (LIPITOR) 80 MG tablet Take 80 mg by mouth daily.        . cloNIDine (CATAPRES) 0.1 MG tablet Take 0.1 mg by mouth at bedtime.        . Continuous Glucose Monitor DEVI Inject 1 Device into the skin once a week.      . escitalopram (LEXAPRO) 20 MG tablet Take 20 mg by mouth daily.        . famotidine (PEPCID) 20 MG tablet Take 20 mg by mouth daily.        . fenofibrate 160 MG tablet Take 160 mg by mouth daily.        . ferrous gluconate (FERGON) 325 MG tablet Take 325 mg by mouth daily with breakfast.        . glucagon (GLUCAGEN) 1 MG SOLR 1 mg. As Directed        . insulin aspart (NOVOLOG) 100 UNIT/ML injection For use in pump, total of 200 units per day  7 vial  12  . Insulin Infusion Pump Supplies (  PARADIGM QUICK-SET 32" 9MM) MISC 1 Device by Does not apply route every 3 (three) days.  30 each  3  . Insulin Infusion Pump Supplies (PARADIGM RESERVOIR 3ML) MISC 1 Device by Does not apply route every 3 (three) days.  10 each  3  . insulin lispro (HUMALOG KWIKPEN) 100 UNIT/ML injection Use as needed, if pump does not work, and pen needles # 30  15 mL  12  . loperamide (IMODIUM) 2 MG capsule Take 1 mg by mouth 2 (two) times daily.       Marland Kitchen loratadine (CLARITIN) 10 MG tablet Take 10 mg by mouth daily.      . medroxyPROGESTERone (DEPO-PROVERA) 150 MG/ML injection Inject 150 mg into the muscle every 3 (three) months.        . Multiple Vitamin (MULTIVITAMIN) capsule Take 1 capsule by mouth daily.        Earney Navy Bicarbonate (ZEGERID) 20-1100 MG CAPS Take 1 capsule by mouth daily.      . ramipril (ALTACE) 2.5 MG capsule Take 2.5 mg by mouth 2 (two) times daily. Further refills through PCP.       No current facility-administered medications on file prior to visit.    Allergies  Allergen Reactions  . Aspartame   . Clarithromycin   . Codeine   . Mesalamine   .  Sulfonamide Derivatives    Family History  Problem Relation Age of Onset  . Cancer Father    BP 118/66  Pulse 84  Temp(Src) 98.6 F (37 C) (Oral)  Ht 5' 2"  (1.575 m)  Wt 166 lb (75.297 kg)  BMI 30.35 kg/m2  SpO2 97%  Review of Systems Denies LOC and weight change    Objective:   Physical Exam VITAL SIGNS:  See vs page GENERAL: no distress Lab Results  Component Value Date   HGBA1C 8.2* 09/06/2013      Assessment & Plan:  DM: This insulin pump regimen was chosen from multiple options, as it best matches her insulin to her changing requirements throughout the day.  The benefits of glycemic control must be weighed against the risks of hypoglycemia.  She needs increased rx.   Renal insufficiency: this increases the risk of hypoglycemia.  Occupational situation: pt says control will improve when this is better.

## 2013-09-06 NOTE — Patient Instructions (Signed)
blood tests are being requested for you today.  We'll contact you with results.   pending the test results, please: continue basal rate of 3 units/hr, except for 7 units/hr, 3 am to 6 am.  continue correction bolus (which some people call "sensitivity," or "insulin sensitivity ratio," or just "isr") of 1 unit for each 25 by which your glucose exceeds 115.  Continue pre-meal boluses of 35 units.   check your blood sugar 6 times a day.  vary the time of day when you check, between before the 3 meals, and at bedtime.  also check if you have symptoms of your blood sugar being too high or too low.  please keep a record of the readings and bring it to your next appointment here.  please call us sooner if your blood sugar goes below 70, or if it stays over 200.   Please come back for a follow-up appointment in 3 months

## 2013-10-06 ENCOUNTER — Other Ambulatory Visit: Payer: Self-pay

## 2013-10-06 MED ORDER — INSULIN LISPRO 100 UNIT/ML ~~LOC~~ SOLN: SUBCUTANEOUS | Status: DC

## 2013-10-07 ENCOUNTER — Other Ambulatory Visit: Payer: Self-pay

## 2013-10-07 MED ORDER — INSULIN ASPART PROT & ASPART (70-30 MIX) 100 UNIT/ML PEN: PEN_INJECTOR | SUBCUTANEOUS | Status: DC

## 2013-11-15 ENCOUNTER — Telehealth: Payer: Self-pay

## 2013-11-15 ENCOUNTER — Other Ambulatory Visit: Payer: Self-pay

## 2013-11-15 ENCOUNTER — Ambulatory Visit: Payer: BC Managed Care – PPO | Admitting: Endocrinology

## 2013-11-15 MED ORDER — INSULIN ASPART 100 UNIT/ML ~~LOC~~ SOLN: SUBCUTANEOUS | Status: DC

## 2013-11-15 NOTE — Telephone Encounter (Signed)
The patient called and stated her novolog rx is incorrect.  She is hoping for an updated rx   Callback - [phone]

## 2013-11-15 NOTE — Telephone Encounter (Signed)
Script changed.

## 2013-11-16 ENCOUNTER — Other Ambulatory Visit: Payer: Self-pay

## 2013-11-16 MED ORDER — INSULIN ASPART 100 UNIT/ML ~~LOC~~ SOLN: SUBCUTANEOUS | Status: DC

## 2013-11-29 ENCOUNTER — Encounter: Payer: Self-pay | Admitting: Endocrinology

## 2013-11-29 ENCOUNTER — Ambulatory Visit (INDEPENDENT_AMBULATORY_CARE_PROVIDER_SITE_OTHER): Payer: BC Managed Care – PPO | Admitting: Endocrinology

## 2013-11-29 VITALS — BP 124/82 | HR 81 | Temp 97.8°F | Ht 62.0 in | Wt 165.0 lb

## 2013-11-29 DIAGNOSIS — E1029 Type 1 diabetes mellitus with other diabetic kidney complication: Secondary | ICD-10-CM

## 2013-11-29 LAB — HEMOGLOBIN A1C: Hgb A1c MFr Bld: 8.4 % — ABNORMAL HIGH (ref 4.6–6.5)

## 2013-11-29 LAB — MICROALBUMIN / CREATININE URINE RATIO
CREATININE, U: 71.2 mg/dL
MICROALB UR: 38.1 mg/dL — AB (ref 0.0–1.9)
Microalb Creat Ratio: 53.5 mg/g — ABNORMAL HIGH (ref 0.0–30.0)

## 2013-11-29 NOTE — Patient Instructions (Addendum)
blood tests are being requested for you today.  We'll contact you with results.   pending the test results, please: reduce basal rate to 2.5 units/hr, except for 7 units/hr, 3 am to 6 am.  continue correction bolus (which some people call "sensitivity," or "insulin sensitivity ratio," or just "isr") of 1 unit for each 25 by which your glucose exceeds 115.  Take pre-meal boluses of 30 units.   check your blood sugar 6 times a day.  vary the time of day when you check, between before the 3 meals, and at bedtime.  also check if you have symptoms of your blood sugar being too high or too low.  please keep a record of the readings and bring it to your next appointment here.  please call us sooner if your blood sugar goes below 70, or if it stays over 200.   Please come back for a follow-up appointment in 3 months.

## 2013-11-29 NOTE — Progress Notes (Signed)
Subjective:    Patient ID: Rhonda Gutierrez, female    DOB: 03/13/1961, 53 y.o.   MRN: [id number]  HPI Pt returns for f/u of type 1 DM (dx'ed 2008; she has mild if any neuropathy of the lower extremities, but she has associated renal insufficiency; she has been on pump rx since 2008; however, she stopped continuous glucose monitor in 2014, because she could not afford supplies; she has never had severe hypoglycemia or DKA).  She averages 175-200 units total per day, via her pump.   pt states she feels well in general. she brings a record of his cbg's which i have reviewed today.  It varies from 60-300, but most are in the 100's.  There is no trend throughout the day.  She has mild hypoglycemia approx 1-2 times per week.  this happens when a meal is delayed.  she only takes 25 units with meals, due to the safety feature of the pump.   Past Medical History  Diagnosis Date  . Ulcerative colitis, unspecified   . Type I (juvenile type) diabetes mellitus without mention of complication, not stated as uncontrolled   . Unspecified essential hypertension   . Fatty liver disease, nonalcoholic   . Dyslipidemia     Past Surgical History  Procedure Laterality Date  . Nasal sinus surgery  02/10/1994  . Appendectomy    . Protocolectomy  12/08/2005    total  . Ileostomy  06/17/2006    take down  . Insulin pump      History   Social History  . Marital Status: Married    Spouse Name: N/A    Number of Children: N/A  . Years of Education: N/A   Occupational History  . 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     Social History Main Topics  . Smoking status: Never Smoker   . Smokeless tobacco: Not on file  . Alcohol Use: Not on file  . Drug Use: Not on file  . Sexual Activity: Not on file   Other Topics Concern  . 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 She believes that when she returns to work  with much more activity, her glucoses will come down some more.    Current Outpatient Prescriptions on File Prior to Visit  Medication Sig Dispense Refill  . aspirin 81 MG tablet Take 81 mg by mouth daily.        Marland Kitchen atorvastatin (LIPITOR) 80 MG tablet Take 80 mg by mouth daily.        . cloNIDine (CATAPRES) 0.1 MG tablet Take 0.1 mg by mouth at bedtime.        . Continuous Glucose Monitor DEVI Inject 1 Device into the skin once a week.      . escitalopram (LEXAPRO) 20 MG tablet Take 20 mg by mouth daily.        . famotidine (PEPCID) 20 MG tablet Take 20 mg by mouth daily.        . fenofibrate 160 MG tablet Take 160 mg by mouth daily.        . ferrous gluconate (FERGON) 325 MG tablet Take 325 mg by mouth daily with breakfast.        . glucagon (GLUCAGEN) 1 MG SOLR 1 mg. As Directed        . insulin aspart (NOVOLOG) 100  UNIT/ML injection For use in pump, total of 230 units per day  7 vial  2  . Insulin Infusion Pump Supplies (PARADIGM QUICK-SET 32" 9MM) MISC 1 Device by Does not apply route every 3 (three) days.  30 each  3  . Insulin Infusion Pump Supplies (PARADIGM RESERVOIR 3ML) MISC 1 Device by Does not apply route every 3 (three) days.  10 each  3  . insulin lispro (HUMALOG) 100 UNIT/ML injection Use as needed, if pump does not work, and pen needles # 30  15 mL  12  . loperamide (IMODIUM) 2 MG capsule Take 1 mg by mouth 2 (two) times daily.       Marland Kitchen loratadine (CLARITIN) 10 MG tablet Take 10 mg by mouth daily.      . medroxyPROGESTERone (DEPO-PROVERA) 150 MG/ML injection Inject 150 mg into the muscle every 3 (three) months.        . Multiple Vitamin (MULTIVITAMIN) capsule Take 1 capsule by mouth daily.        Earney Navy Bicarbonate (ZEGERID) 20-1100 MG CAPS Take 1 capsule by mouth daily.      . ramipril (ALTACE) 2.5 MG capsule Take 2.5 mg by mouth 2 (two) times daily. Further refills through PCP.       No current facility-administered medications on file prior to visit.     Allergies  Allergen Reactions  . Aspartame   . Clarithromycin   . Codeine   . Mesalamine   . Sulfonamide Derivatives     Family History  Problem Relation Age of Onset  . Cancer Father     BP 124/82  Pulse 81  Temp(Src) 97.8 F (36.6 C) (Oral)  Ht 5' 2"  (1.575 m)  Wt 165 lb (74.844 kg)  BMI 30.17 kg/m2  SpO2 98%  Review of Systems Denies LOC ands weight change.      Objective:   Physical Exam VITAL SIGNS:  See vs page GENERAL: no distress SKIN:  Infusn injection sites at the anterior abdomen are normal.    Lab Results  Component Value Date   HGBA1C 8.4* 11/29/2013      Assessment & Plan:  DM: The pattern of his cbg's indicates she needs some adjustment in her pump therapy. Renal insufficiency: this increases the risk of hypoglycemia.

## 2013-11-30 ENCOUNTER — Telehealth: Payer: Self-pay | Admitting: Endocrinology

## 2013-11-30 NOTE — Telephone Encounter (Signed)
Requesting call back from [facility] regarding [place] approval for humulog and results

## 2013-11-30 NOTE — Telephone Encounter (Signed)
Requested call back.

## 2013-12-06 ENCOUNTER — Other Ambulatory Visit: Payer: Self-pay

## 2013-12-06 MED ORDER — INSULIN LISPRO 100 UNIT/ML ~~LOC~~ SOLN: SUBCUTANEOUS | Status: DC

## 2013-12-08 ENCOUNTER — Telehealth: Payer: Self-pay | Admitting: Endocrinology

## 2013-12-08 NOTE — Telephone Encounter (Signed)
Contacted, pt and stated that we have not received confirmation on the Humalog.

## 2013-12-08 NOTE — Telephone Encounter (Signed)
Has Jinny Blossom been able to get [employer] to understand what is going on with the Humulin

## 2013-12-15 ENCOUNTER — Telehealth: Payer: Self-pay | Admitting: Endocrinology

## 2013-12-15 NOTE — Telephone Encounter (Signed)
Pharmacy calling to let us know the form is coming to [place] today  We nned to indication the advantage primary plan and that its a vial not a quick pen

## 2013-12-15 NOTE — Telephone Encounter (Signed)
Form faxed

## 2013-12-19 ENCOUNTER — Other Ambulatory Visit: Payer: Self-pay

## 2013-12-19 MED ORDER — INSULIN LISPRO 100 UNIT/ML ~~LOC~~ SOLN: SUBCUTANEOUS | Status: DC

## 2014-02-28 ENCOUNTER — Ambulatory Visit: Payer: BC Managed Care – PPO | Admitting: Endocrinology

## 2014-03-23 ENCOUNTER — Encounter: Payer: Self-pay | Admitting: Endocrinology

## 2014-03-23 ENCOUNTER — Ambulatory Visit (INDEPENDENT_AMBULATORY_CARE_PROVIDER_SITE_OTHER): Payer: BC Managed Care – PPO | Admitting: Endocrinology

## 2014-03-23 VITALS — BP 136/90 | HR 89 | Temp 98.6°F | Ht 62.0 in | Wt 164.0 lb

## 2014-03-23 DIAGNOSIS — E1029 Type 1 diabetes mellitus with other diabetic kidney complication: Secondary | ICD-10-CM

## 2014-03-23 LAB — HEMOGLOBIN A1C: Hgb A1c MFr Bld: 8.7 % — ABNORMAL HIGH (ref 4.6–6.5)

## 2014-03-23 MED ORDER — GLUCAGON HCL (RDNA) 1 MG IJ SOLR: 1.0000 mg | Freq: Once | INTRAMUSCULAR | Status: DC

## 2014-03-23 NOTE — Progress Notes (Signed)
Subjective:    Patient ID: Rhonda Gutierrez, female    DOB: 10-27-1960, 53 y.o.   MRN: [id number]  HPI Pt returns for f/u of type 1 DM (dx'ed 2007; she has mild if any neuropathy of the lower extremities, but she has associated renal insufficiency; she has been on insulin since 2008, and started pump rx (now medtronic 530G), also in 2008; however, she stopped continuous glucose monitor in 2014, because she could not afford supplies; she has never had pancreatitis, severe hypoglycemia, or DKA).   she brings a record of her cbg's which i have reviewed today.  She has hypoglycemia approx once a week.  This can happen at any time of day or situation.  She says the anxiety of 2 jobs is compromising her ability to care for her DM.  She takes these settings: basal rate of 3 units/hr, except for 7 units/hr, 2 am to 5 am.  correction bolus (which some people call "sensitivity," or "insulin sensitivity ratio," or just "isr") of 1 unit for each 25 by which your glucose exceeds 115.  pre-meal boluses of 30 units.  Past Medical History  Diagnosis Date  . Ulcerative colitis, unspecified   . Type I (juvenile type) diabetes mellitus without mention of complication, not stated as uncontrolled   . Unspecified essential hypertension   . Fatty liver disease, nonalcoholic   . Dyslipidemia     Past Surgical History  Procedure Laterality Date  . Nasal sinus surgery  02/10/1994  . Appendectomy    . Protocolectomy  12/08/2005    total  . Ileostomy  06/17/2006    take down  . Insulin pump      History   Social History  . Marital Status: Married    Spouse Name: N/A    Number of Children: N/A  . Years of Education: N/A   Occupational History  . 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     Social History Main Topics  . Smoking status: Never Smoker   . Smokeless tobacco: Not on file  . Alcohol Use: Not on file  . Drug Use: Not on file  . Sexual Activity: Not on file   Other Topics Concern  . 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 She believes that when she returns to work with much more activity, her glucoses will come down some more.    Current Outpatient Prescriptions on File Prior to Visit  Medication Sig Dispense Refill  . aspirin 81 MG tablet Take 81 mg by mouth daily.        Marland Kitchen atorvastatin (LIPITOR) 80 MG tablet Take 80 mg by mouth daily.        . cloNIDine (CATAPRES) 0.1 MG tablet Take 0.1 mg by mouth at bedtime.        . Continuous Glucose Monitor DEVI Inject 1 Device into the skin once a week.      . escitalopram (LEXAPRO) 20 MG tablet Take 20 mg by mouth daily.        . famotidine (PEPCID) 20 MG tablet Take 20 mg by mouth daily.        . fenofibrate 160 MG tablet Take 160 mg by mouth daily.        . ferrous gluconate (FERGON) 325 MG tablet Take 325 mg by mouth daily with breakfast.        .  Insulin Infusion Pump Supplies (PARADIGM QUICK-SET 32" 9MM) MISC 1 Device by Does not apply route every 3 (three) days.  30 each  3  . Insulin Infusion Pump Supplies (PARADIGM RESERVOIR 3ML) MISC 1 Device by Does not apply route every 3 (three) days.  10 each  3  . insulin lispro (HUMALOG) 100 UNIT/ML injection For use in pump, total of 230 units per day.  7 vial  2  . loperamide (IMODIUM) 2 MG capsule Take 1 mg by mouth 2 (two) times daily.       Marland Kitchen loratadine (CLARITIN) 10 MG tablet Take 10 mg by mouth daily.      . medroxyPROGESTERone (DEPO-PROVERA) 150 MG/ML injection Inject 150 mg into the muscle every 3 (three) months.        . Multiple Vitamin (MULTIVITAMIN) capsule Take 1 capsule by mouth daily.        Earney Navy Bicarbonate (ZEGERID) 20-1100 MG CAPS Take 1 capsule by mouth daily.      . ramipril (ALTACE) 2.5 MG capsule Take 2.5 mg by mouth 2 (two) times daily. Further refills through PCP.      Marland Kitchen insulin aspart (NOVOLOG) 100 UNIT/ML injection For use in pump, total of 230  units per day  7 vial  2   No current facility-administered medications on file prior to visit.    Allergies  Allergen Reactions  . Aspartame   . Clarithromycin   . Codeine   . Mesalamine   . Sulfonamide Derivatives     Family History  Problem Relation Age of Onset  . Cancer Father     BP 136/90  Pulse 89  Temp(Src) 98.6 F (37 C) (Oral)  Ht 5' 2"  (1.575 m)  Wt 164 lb (74.39 kg)  BMI 29.99 kg/m2  SpO2 96%  Review of Systems Denies LOC and weight change.     Objective:   Physical Exam VITAL SIGNS:  See vs page GENERAL: no distress Pulses: dorsalis pedis intact bilat.   Feet: no deformity. normal color and temp.  no edema Skin:  no ulcer on the feet.   Neuro: sensation is intact to touch on the feet  Lab Results  Component Value Date   HGBA1C 8.7* 03/23/2014      Assessment & Plan:  DM: moderate exacerbation Occupational situation: we discussed: pt says this will improve.  We'll follow.   Patient is advised the following: Patient Instructions  blood tests are being requested for you today.  We'll contact you with results.   pending the test results, please: reduce basal rate to 3 units/hr, except for 7 units/hr, 3 am to 6 am.  continue correction bolus (which some people call "sensitivity," or "insulin sensitivity ratio," or just "isr") of 1 unit for each 25 by which your glucose exceeds 115.  Take pre-meal boluses of 30 units.   check your blood sugar 6 times a day.  vary the time of day when you check, between before the 3 meals, and at bedtime.  also check if you have symptoms of your blood sugar being too high or too low.  please keep a record of the readings and bring it to your next appointment here.  please call us sooner if your blood sugar goes below 70, or if it stays over 200.   In particular, please check more at bedtime, so you can bolus for high blood sugar then, to in turn get the morning readings better.   Please come back for a follow-up  appointment in 3 months.

## 2014-03-23 NOTE — Patient Instructions (Addendum)
blood tests are being requested for you today.  We'll contact you with results.   pending the test results, please: reduce basal rate to 3 units/hr, except for 7 units/hr, 3 am to 6 am.  continue correction bolus (which some people call "sensitivity," or "insulin sensitivity ratio," or just "isr") of 1 unit for each 25 by which your glucose exceeds 115.  Take pre-meal boluses of 30 units.   check your blood sugar 6 times a day.  vary the time of day when you check, between before the 3 meals, and at bedtime.  also check if you have symptoms of your blood sugar being too high or too low.  please keep a record of the readings and bring it to your next appointment here.  please call us sooner if your blood sugar goes below 70, or if it stays over 200.   In particular, please check more at bedtime, so you can bolus for high blood sugar then, to in turn get the morning readings better.   Please come back for a follow-up appointment in 3 months.

## 2014-03-27 ENCOUNTER — Other Ambulatory Visit: Payer: Self-pay

## 2014-03-27 MED ORDER — INSULIN LISPRO 100 UNIT/ML (KWIKPEN): PEN_INJECTOR | SUBCUTANEOUS | Status: AC

## 2014-04-24 ENCOUNTER — Other Ambulatory Visit: Payer: Self-pay | Admitting: Family Medicine

## 2014-04-24 DIAGNOSIS — J0111 Acute recurrent frontal sinusitis: Secondary | ICD-10-CM

## 2014-04-27 ENCOUNTER — Inpatient Hospital Stay: Admission: RE | Admit: 2014-04-27 | Payer: BC Managed Care – PPO | Source: Ambulatory Visit

## 2014-06-16 ENCOUNTER — Other Ambulatory Visit: Payer: Self-pay

## 2014-07-16 ENCOUNTER — Other Ambulatory Visit: Payer: Self-pay | Admitting: Endocrinology

## 2014-08-17 ENCOUNTER — Other Ambulatory Visit: Payer: Self-pay | Admitting: Endocrinology

## 2014-09-22 ENCOUNTER — Other Ambulatory Visit: Payer: Self-pay | Admitting: Endocrinology

## 2014-10-17 ENCOUNTER — Other Ambulatory Visit: Payer: Self-pay | Admitting: *Deleted

## 2014-10-17 ENCOUNTER — Ambulatory Visit
Admission: RE | Admit: 2014-10-17 | Discharge: 2014-10-17 | Disposition: A | Discharge status: Home / Self Care | Payer: BLUE CROSS/BLUE SHIELD | Source: Ambulatory Visit | Attending: *Deleted | Admitting: *Deleted

## 2014-10-17 DIAGNOSIS — R51 Headache: Secondary | ICD-10-CM

## 2014-10-17 DIAGNOSIS — R42 Dizziness and giddiness: Secondary | ICD-10-CM

## 2014-10-17 DIAGNOSIS — R519 Headache, unspecified: Secondary | ICD-10-CM

## 2014-10-18 ENCOUNTER — Other Ambulatory Visit: Payer: Self-pay

## 2014-11-08 ENCOUNTER — Other Ambulatory Visit: Payer: Self-pay | Admitting: Endocrinology

## 2015-02-26 ENCOUNTER — Other Ambulatory Visit: Payer: Self-pay

## 2015-04-09 ENCOUNTER — Other Ambulatory Visit: Payer: Self-pay | Admitting: Endocrinology

## 2015-04-09 NOTE — Telephone Encounter (Signed)
Please advise if ok to refill. Last office visit was 03/23/2014. Thanks!

## 2015-07-28 ENCOUNTER — Emergency Department (HOSPITAL_BASED_OUTPATIENT_CLINIC_OR_DEPARTMENT_OTHER)
Admission: EM | Admit: 2015-07-28 | Discharge: 2015-07-28 | Disposition: A | Discharge status: Home / Self Care | Payer: BLUE CROSS/BLUE SHIELD | Source: Home / Self Care | Attending: Emergency Medicine | Admitting: Emergency Medicine

## 2015-07-28 ENCOUNTER — Emergency Department (HOSPITAL_BASED_OUTPATIENT_CLINIC_OR_DEPARTMENT_OTHER): Payer: BLUE CROSS/BLUE SHIELD

## 2015-07-28 ENCOUNTER — Encounter (HOSPITAL_BASED_OUTPATIENT_CLINIC_OR_DEPARTMENT_OTHER): Payer: Self-pay | Admitting: *Deleted

## 2015-07-28 DIAGNOSIS — E860 Dehydration: Secondary | ICD-10-CM | POA: Diagnosis not present

## 2015-07-28 DIAGNOSIS — A09 Infectious gastroenteritis and colitis, unspecified: Secondary | ICD-10-CM | POA: Diagnosis not present

## 2015-07-28 DIAGNOSIS — K529 Noninfective gastroenteritis and colitis, unspecified: Secondary | ICD-10-CM

## 2015-07-28 LAB — COMPREHENSIVE METABOLIC PANEL
ALBUMIN: 4.9 g/dL (ref 3.5–5.0)
ALK PHOS: 112 U/L (ref 38–126)
ALT: 28 U/L (ref 14–54)
ANION GAP: 15 (ref 5–15)
AST: 56 U/L — ABNORMAL HIGH (ref 15–41)
BUN: 27 mg/dL — ABNORMAL HIGH (ref 6–20)
CALCIUM: 10 mg/dL (ref 8.9–10.3)
CHLORIDE: 107 mmol/L (ref 101–111)
CO2: 17 mmol/L — AB (ref 22–32)
Creatinine, Ser: 0.92 mg/dL (ref 0.44–1.00)
GFR calc Af Amer: >60 mL/min (ref >60)
GFR calc non Af Amer: >60 mL/min (ref >60)
GLUCOSE: 138 mg/dL — AB (ref 65–99)
Potassium: 4.8 mmol/L (ref 3.5–5.1)
SODIUM: 139 mmol/L (ref 135–145)
Total Bilirubin: 1.2 mg/dL (ref 0.3–1.2)
Total Protein: 8.6 g/dL — ABNORMAL HIGH (ref 6.5–8.1)

## 2015-07-28 LAB — URINALYSIS, ROUTINE W REFLEX MICROSCOPIC
GLUCOSE, UA: NEGATIVE mg/dL
HGB URINE DIPSTICK: NEGATIVE
Ketones, ur: 15 mg/dL — AB
Nitrite: NEGATIVE
PH: 5 (ref 5.0–8.0)
Protein, ur: 100 mg/dL — AB
SPECIFIC GRAVITY, URINE: 1.036 — AB (ref 1.005–1.030)

## 2015-07-28 LAB — DIFFERENTIAL
Basophils Absolute: 0 10*3/uL (ref 0.0–0.1)
Basophils Relative: 0 %
EOS ABS: 0.1 10*3/uL (ref 0.0–0.7)
EOS PCT: 0 %
LYMPHS ABS: 0.8 10*3/uL (ref 0.7–4.0)
LYMPHS PCT: 4 %
MONO ABS: 0.7 10*3/uL (ref 0.1–1.0)
MONOS PCT: 4 %
NEUTROS PCT: 92 %
Neutro Abs: 16.7 10*3/uL — ABNORMAL HIGH (ref 1.7–7.7)

## 2015-07-28 LAB — URINE MICROSCOPIC-ADD ON

## 2015-07-28 LAB — CBC
HCT: 42.4 % (ref 36.0–46.0)
HEMOGLOBIN: 13.8 g/dL (ref 12.0–15.0)
MCH: 28.6 pg (ref 26.0–34.0)
MCHC: 32.5 g/dL (ref 30.0–36.0)
MCV: 87.8 fL (ref 78.0–100.0)
Platelets: 317 10*3/uL (ref 150–400)
RBC: 4.83 MIL/uL (ref 3.87–5.11)
RDW: 13.1 % (ref 11.5–15.5)
WBC: 18.4 10*3/uL — ABNORMAL HIGH (ref 4.0–10.5)

## 2015-07-28 LAB — PREGNANCY, URINE: Preg Test, Ur: NEGATIVE

## 2015-07-28 LAB — LIPASE, BLOOD: LIPASE: 19 U/L (ref 11–51)

## 2015-07-28 MED ORDER — SODIUM CHLORIDE 0.9 % IV BOLUS (SEPSIS)
1000.0000 mL | Freq: Once | INTRAVENOUS | Status: AC
  Administered 2015-07-28: 1000 mL via INTRAVENOUS

## 2015-07-28 MED ORDER — IOHEXOL 300 MG/ML  SOLN
100.0000 mL | Freq: Once | INTRAMUSCULAR | Status: AC | PRN
  Administered 2015-07-28: 100 mL via INTRAVENOUS

## 2015-07-28 MED ORDER — ONDANSETRON HCL 8 MG PO TABS: 8.0000 mg | ORAL_TABLET | Freq: Three times a day (TID) | ORAL | Status: DC | PRN

## 2015-07-28 MED ORDER — IOHEXOL 350 MG/ML SOLN: 100.0000 mL | Freq: Once | INTRAVENOUS | Status: DC | PRN

## 2015-07-28 MED ORDER — FENTANYL CITRATE (PF) 100 MCG/2ML IJ SOLN
100.0000 ug | Freq: Once | INTRAMUSCULAR | Status: AC
  Administered 2015-07-28: 100 ug via INTRAVENOUS
  Filled 2015-07-28: qty 2

## 2015-07-28 MED ORDER — IOHEXOL 300 MG/ML  SOLN
25.0000 mL | Freq: Once | INTRAMUSCULAR | Status: AC | PRN
  Administered 2015-07-28: 25 mL via ORAL

## 2015-07-28 MED ORDER — ONDANSETRON HCL 4 MG/2ML IJ SOLN
4.0000 mg | Freq: Once | INTRAMUSCULAR | Status: AC | PRN
  Administered 2015-07-28: 4 mg via INTRAVENOUS
  Filled 2015-07-28: qty 2

## 2015-07-28 NOTE — ED Notes (Signed)
Pt in CT.

## 2015-07-28 NOTE — ED Notes (Signed)
Patient transported to CT 

## 2015-07-28 NOTE — ED Provider Notes (Signed)
CSN: [id number]     Arrival date & time 07/28/15  0115 History   First MD Initiated Contact with Patient 07/28/15 0236     Chief Complaint  Patient presents with  . Abdominal Pain     (Consider location/radiation/quality/duration/timing/severity/associated sxs/prior Treatment) HPI  This is a 54 year old female with history of ulcerative colitis status post total colectomy with J-pouch. She also developed type 1 diabetes in her 46s and has an insulin pump. She is here with abdominal pain that began yesterday evening about 7:30 PM. The pain is primarily in the lower abdomen. She describes it as sharp and intermittent but not localized. It is severe at its worst. Pain is exaggerated with movement or palpation. She has had associated nausea, vomiting and diarrhea. There has been no blood in her diarrhea. She's not aware of having a fever. She feels like she is dehydrated.  Past Medical History  Diagnosis Date  . Ulcerative colitis, unspecified   . Type I (juvenile type) diabetes mellitus without mention of complication, not stated as uncontrolled   . Unspecified essential hypertension   . Fatty liver disease, nonalcoholic   . Dyslipidemia    Past Surgical History  Procedure Laterality Date  . Nasal sinus surgery  02/10/1994  . Appendectomy    . Protocolectomy  12/08/2005    total  . Ileostomy  06/17/2006    take down  . Insulin pump     Family History  Problem Relation Age of Onset  . Cancer Father    Social History  Substance Use Topics  . Smoking status: Never Smoker   . Smokeless tobacco: None  . Alcohol Use: Yes   OB History    No data available     Review of Systems  All other systems reviewed and are negative.   Allergies  Aspartame; Clarithromycin; Codeine; Mesalamine; and Sulfonamide derivatives  Home Medications   Prior to Admission medications   Medication Sig Start Date End Date Taking? Authorizing Provider  aspirin 81 MG tablet Take 81 mg by mouth daily.       Historical Provider, MD  atorvastatin (LIPITOR) 80 MG tablet Take 80 mg by mouth daily.      Historical Provider, MD  cloNIDine (CATAPRES) 0.1 MG tablet Take 0.1 mg by mouth at bedtime.      Historical Provider, MD  Continuous Glucose Monitor DEVI Inject 1 Device into the skin once a week.    Historical Provider, MD  escitalopram (LEXAPRO) 20 MG tablet Take 20 mg by mouth daily.      Historical Provider, MD  esomeprazole (NEXIUM) 40 MG capsule Take 40 mg by mouth daily at 12 noon.    Historical Provider, MD  famotidine (PEPCID) 20 MG tablet Take 20 mg by mouth daily.      Historical Provider, MD  fenofibrate 160 MG tablet Take 160 mg by mouth daily.      Historical Provider, MD  ferrous gluconate (FERGON) 325 MG tablet Take 325 mg by mouth daily with breakfast.      Historical Provider, MD  fluticasone (FLONASE) 50 MCG/ACT nasal spray Place into both nostrils daily.    Historical Provider, MD  GLUCAGEN HYPOKIT 1 MG SOLR injection INJECT 1 MG INTO THE MUSCLE ONCE AS DIRECTED 04/09/15   Renato Shin, MD  HUMALOG 100 UNIT/ML injection USE IN INSULIN PUMP FOR A TOTAL OF 230 UNITS PER DAY 09/25/14   Renato Shin, MD  insulin aspart (NOVOLOG) 100 UNIT/ML injection For use in pump, total of  230 units per day 11/16/13   Renato Shin, MD  Insulin Infusion Pump Supplies (PARADIGM QUICK-SET 32" 9MM) MISC 1 Device by Does not apply route every 3 (three) days. 10/12/12   Renato Shin, MD  Insulin Infusion Pump Supplies (PARADIGM RESERVOIR 3ML) MISC 1 Device by Does not apply route every 3 (three) days. 10/12/12   Renato Shin, MD  insulin lispro (HUMALOG KWIKPEN) 100 UNIT/ML KiwkPen Use as directed if pump does not work 03/27/14   Renato Shin, MD  insulin lispro (HUMALOG) 100 UNIT/ML injection For use in pump, total of 230 units per day. 12/19/13   Renato Shin, MD  loperamide (IMODIUM) 2 MG capsule Take 1 mg by mouth 2 (two) times daily.     Historical Provider, MD  loratadine (CLARITIN) 10 MG tablet Take 10 mg  by mouth daily.    Historical Provider, MD  medroxyPROGESTERone (DEPO-PROVERA) 150 MG/ML injection Inject 150 mg into the muscle every 3 (three) months.      Historical Provider, MD  Multiple Vitamin (MULTIVITAMIN) capsule Take 1 capsule by mouth daily.      Historical Provider, MD  Omeprazole-Sodium Bicarbonate (ZEGERID) 20-1100 MG CAPS Take 1 capsule by mouth daily.    Historical Provider, MD  ramipril (ALTACE) 2.5 MG capsule Take 2.5 mg by mouth 2 (two) times daily. Further refills through PCP. 11/20/11   Brand Males, MD   BP 108/62 mmHg  Pulse 89  Temp(Src) 98.4 F (36.9 C) (Oral)  Resp 19  Ht 5' 2"  (1.575 m)  Wt 165 lb (74.844 kg)  BMI 30.17 kg/m2  SpO2 95%   Physical Exam  General: Well-developed, well-nourished female in no acute distress; appearance consistent with age of record HENT: normocephalic; atraumatic Eyes: pupils equal, round and reactive to light; extraocular muscles intact Neck: supple Heart: regular rate and rhythm Lungs: clear to auscultation bilaterally Abdomen: soft; nondistended; diffuse tenderness most prominent in the right lower quadrant; no masses or hepatosplenomegaly; bowel sounds present Extremities: No deformity; full range of motion; pulses normal Neurologic: Awake, alert and oriented; motor function intact in all extremities and symmetric; no facial droop Skin: Warm and dry Psychiatric: Normal mood and affect    ED Course  Procedures (including critical care time)   MDM   Nursing notes and vitals signs, including pulse oximetry, reviewed.  Summary of this visit's results, reviewed by myself:  Labs:  Results for orders placed or performed during the hospital encounter of 07/28/15 (from the past 24 hour(s))  Lipase, blood     Status: None   Collection Time: 07/28/15  1:50 AM  Result Value Ref Range   Lipase 19 11 - 51 U/L  Comprehensive metabolic panel     Status: Abnormal   Collection Time: 07/28/15  1:50 AM  Result Value Ref  Range   Sodium 139 135 - 145 mmol/L   Potassium 4.8 3.5 - 5.1 mmol/L   Chloride 107 101 - 111 mmol/L   CO2 17 (L) 22 - 32 mmol/L   Glucose, Bld 138 (H) 65 - 99 mg/dL   BUN 27 (H) 6 - 20 mg/dL   Creatinine, Ser 0.92 0.44 - 1.00 mg/dL   Calcium 10.0 8.9 - 10.3 mg/dL   Total Protein 8.6 (H) 6.5 - 8.1 g/dL   Albumin 4.9 3.5 - 5.0 g/dL   AST 56 (H) 15 - 41 U/L   ALT 28 14 - 54 U/L   Alkaline Phosphatase 112 38 - 126 U/L   Total Bilirubin 1.2 0.3 - 1.2  mg/dL   GFR calc non Af Amer >60 >60 mL/min   GFR calc Af Amer >60 >60 mL/min   Anion gap 15 5 - 15  CBC     Status: Abnormal   Collection Time: 07/28/15  1:50 AM  Result Value Ref Range   WBC 18.4 (H) 4.0 - 10.5 K/uL   RBC 4.83 3.87 - 5.11 MIL/uL   Hemoglobin 13.8 12.0 - 15.0 g/dL   HCT 42.4 36.0 - 46.0 %   MCV 87.8 78.0 - 100.0 fL   MCH 28.6 26.0 - 34.0 pg   MCHC 32.5 30.0 - 36.0 g/dL   RDW 13.1 11.5 - 15.5 %   Platelets 317 150 - 400 K/uL  Differential     Status: Abnormal   Collection Time: 07/28/15  1:50 AM  Result Value Ref Range   Neutrophils Relative % 92 %   Neutro Abs 16.7 (H) 1.7 - 7.7 K/uL   Lymphocytes Relative 4 %   Lymphs Abs 0.8 0.7 - 4.0 K/uL   Monocytes Relative 4 %   Monocytes Absolute 0.7 0.1 - 1.0 K/uL   Eosinophils Relative 0 %   Eosinophils Absolute 0.1 0.0 - 0.7 K/uL   Basophils Relative 0 %   Basophils Absolute 0.0 0.0 - 0.1 K/uL  Urinalysis, Routine w reflex microscopic (not at [facility])     Status: Abnormal   Collection Time: 07/28/15  2:43 AM  Result Value Ref Range   Color, Urine ORANGE (A) YELLOW   APPearance CLOUDY (A) CLEAR   Specific Gravity, Urine 1.036 (H) 1.005 - 1.030   pH 5.0 5.0 - 8.0   Glucose, UA NEGATIVE NEGATIVE mg/dL   Hgb urine dipstick NEGATIVE NEGATIVE   Bilirubin Urine SMALL (A) NEGATIVE   Ketones, ur 15 (A) NEGATIVE mg/dL   Protein, ur 100 (A) NEGATIVE mg/dL   Nitrite NEGATIVE NEGATIVE   Leukocytes, UA SMALL (A) NEGATIVE  Pregnancy, urine     Status: None   Collection  Time: 07/28/15  2:43 AM  Result Value Ref Range   Preg Test, Ur NEGATIVE NEGATIVE  Urine microscopic-add on     Status: Abnormal   Collection Time: 07/28/15  2:43 AM  Result Value Ref Range   Squamous Epithelial / LPF 6-30 (A) NONE SEEN   WBC, UA 6-30 0 - 5 WBC/hpf   RBC / HPF 0-5 0 - 5 RBC/hpf   Bacteria, UA MANY (A) NONE SEEN   Casts GRANULAR CAST (A) NEGATIVE   Urine-Other MUCOUS PRESENT     Imaging Studies: Ct Abdomen Pelvis W Contrast  07/28/2015  CLINICAL DATA:  Acute onset of generalized abdominal pain, nausea and vomiting. Diarrhea. Leukocytosis. Initial encounter. EXAM: CT ABDOMEN AND PELVIS WITH CONTRAST TECHNIQUE: Multidetector CT imaging of the abdomen and pelvis was performed using the standard protocol following bolus administration of intravenous contrast. CONTRAST:  177m OMNIPAQUE IOHEXOL 300 MG/ML  SOLN COMPARISON:  Abdominal radiograph performed 12/22/2005, and abdominal ultrasound performed 09/23/2004 FINDINGS: Minimal bibasilar atelectasis is noted. Mild fatty infiltration is noted within the liver, with peripheral sparing. Hepatomegaly is noted. The liver measures up to 27.2 cm in length. The spleen is unremarkable in appearance. The patient is status post cholecystectomy, with clips noted at the gallbladder fossa. The pancreas and adrenal glands are unremarkable. Enlarged peripancreatic nodes are seen, measuring up to 2.2 cm in short axis. The kidneys are unremarkable in appearance. There is no evidence of hydronephrosis. No renal or ureteral stones are seen. No perinephric stranding is appreciated. No  free fluid is identified. The small bowel is unremarkable in appearance. The stomach is within normal limits. No acute vascular abnormalities are seen. Minimal calcification is noted along the abdominal aorta. The patient is status post colectomy. The ileocolic anastomosis at the distal sigmoid colon is unremarkable in appearance. The bladder is mildly distended and grossly  unremarkable. The uterus is grossly unremarkable. The ovaries are relatively symmetric. No suspicious adnexal masses are seen. No inguinal lymphadenopathy is seen. No acute osseous abnormalities are identified. Vacuum phenomenon is noted at L5-S1, with associated endplate sclerotic change. IMPRESSION: 1. No acute abnormality seen within the abdomen or pelvis. 2. Mild fatty infiltration noted within the liver. Hepatomegaly and splenomegaly noted. 3. Enlarged peripancreatic nodes, measuring up to 2.2 cm in short axis, of uncertain significance. Electronically Signed   By: Garald Balding M.D.   On: 07/28/2015 04:51   5:04 AM Pain and nausea significantly improved. Patient feels better after hydration as well. Patient advised of reassuring laboratory studies and CT scan results.  Shanon Rosser, MD 07/28/15 [phone]

## 2015-07-28 NOTE — ED Notes (Signed)
abd pain onset last pm, n/v  X 3-4, w diarrhea, denies urinary diff

## 2015-07-29 ENCOUNTER — Encounter (HOSPITAL_BASED_OUTPATIENT_CLINIC_OR_DEPARTMENT_OTHER): Payer: Self-pay | Admitting: *Deleted

## 2015-07-29 ENCOUNTER — Inpatient Hospital Stay (HOSPITAL_BASED_OUTPATIENT_CLINIC_OR_DEPARTMENT_OTHER)
Admission: EM | Admit: 2015-07-29 | Discharge: 2015-07-31 | DRG: 392 | Disposition: A | Discharge status: Home / Self Care | Payer: BLUE CROSS/BLUE SHIELD | Attending: Internal Medicine | Admitting: Internal Medicine

## 2015-07-29 DIAGNOSIS — Z66 Do not resuscitate: Secondary | ICD-10-CM | POA: Diagnosis present

## 2015-07-29 DIAGNOSIS — I10 Essential (primary) hypertension: Secondary | ICD-10-CM | POA: Diagnosis present

## 2015-07-29 DIAGNOSIS — A09 Infectious gastroenteritis and colitis, unspecified: Secondary | ICD-10-CM | POA: Diagnosis present

## 2015-07-29 DIAGNOSIS — E785 Hyperlipidemia, unspecified: Secondary | ICD-10-CM | POA: Diagnosis present

## 2015-07-29 DIAGNOSIS — R197 Diarrhea, unspecified: Secondary | ICD-10-CM | POA: Diagnosis not present

## 2015-07-29 DIAGNOSIS — R112 Nausea with vomiting, unspecified: Secondary | ICD-10-CM | POA: Diagnosis not present

## 2015-07-29 DIAGNOSIS — Z9641 Presence of insulin pump (external) (internal): Secondary | ICD-10-CM | POA: Diagnosis present

## 2015-07-29 DIAGNOSIS — E860 Dehydration: Secondary | ICD-10-CM | POA: Diagnosis present

## 2015-07-29 DIAGNOSIS — K529 Noninfective gastroenteritis and colitis, unspecified: Secondary | ICD-10-CM | POA: Diagnosis present

## 2015-07-29 DIAGNOSIS — N179 Acute kidney failure, unspecified: Secondary | ICD-10-CM | POA: Diagnosis present

## 2015-07-29 DIAGNOSIS — E109 Type 1 diabetes mellitus without complications: Secondary | ICD-10-CM | POA: Diagnosis present

## 2015-07-29 DIAGNOSIS — Z9049 Acquired absence of other specified parts of digestive tract: Secondary | ICD-10-CM | POA: Diagnosis not present

## 2015-07-29 DIAGNOSIS — Z7982 Long term (current) use of aspirin: Secondary | ICD-10-CM | POA: Diagnosis not present

## 2015-07-29 LAB — URINALYSIS, ROUTINE W REFLEX MICROSCOPIC
Glucose, UA: NEGATIVE mg/dL
Hgb urine dipstick: NEGATIVE
KETONES UR: 15 mg/dL — AB
NITRITE: NEGATIVE
PROTEIN: 30 mg/dL — AB
Specific Gravity, Urine: 1.02 (ref 1.005–1.030)
pH: 5.5 (ref 5.0–8.0)

## 2015-07-29 LAB — URINE MICROSCOPIC-ADD ON

## 2015-07-29 LAB — CBC WITH DIFFERENTIAL/PLATELET
Basophils Absolute: 0 10*3/uL (ref 0.0–0.1)
Basophils Relative: 0 %
EOS ABS: 0.2 10*3/uL (ref 0.0–0.7)
Eosinophils Relative: 2 %
HCT: 45.1 % (ref 36.0–46.0)
HEMOGLOBIN: 14.7 g/dL (ref 12.0–15.0)
LYMPHS ABS: 1.7 10*3/uL (ref 0.7–4.0)
Lymphocytes Relative: 19 %
MCH: 28.1 pg (ref 26.0–34.0)
MCHC: 32.6 g/dL (ref 30.0–36.0)
MCV: 86.1 fL (ref 78.0–100.0)
Monocytes Absolute: 0.9 10*3/uL (ref 0.1–1.0)
Monocytes Relative: 10 %
NEUTROS PCT: 69 %
Neutro Abs: 6 10*3/uL (ref 1.7–7.7)
Platelets: 343 10*3/uL (ref 150–400)
RBC: 5.24 MIL/uL — AB (ref 3.87–5.11)
RDW: 13.5 % (ref 11.5–15.5)
WBC: 8.7 10*3/uL (ref 4.0–10.5)

## 2015-07-29 LAB — CBG MONITORING, ED: GLUCOSE-CAPILLARY: 175 mg/dL — AB (ref 65–99)

## 2015-07-29 LAB — BASIC METABOLIC PANEL
ANION GAP: 15 (ref 5–15)
BUN: 49 mg/dL — AB (ref 6–20)
CHLORIDE: 98 mmol/L — AB (ref 101–111)
CO2: 19 mmol/L — ABNORMAL LOW (ref 22–32)
Calcium: 9.5 mg/dL (ref 8.9–10.3)
Creatinine, Ser: 3.58 mg/dL — ABNORMAL HIGH (ref 0.44–1.00)
GFR calc non Af Amer: 14 mL/min — ABNORMAL LOW (ref >60)
GFR, EST AFRICAN AMERICAN: 16 mL/min — AB (ref >60)
Glucose, Bld: 177 mg/dL — ABNORMAL HIGH (ref 65–99)
POTASSIUM: 4.1 mmol/L (ref 3.5–5.1)
SODIUM: 132 mmol/L — AB (ref 135–145)

## 2015-07-29 LAB — I-STAT CG4 LACTIC ACID, ED: LACTIC ACID, VENOUS: 1.32 mmol/L (ref 0.5–2.0)

## 2015-07-29 MED ORDER — FENTANYL CITRATE (PF) 100 MCG/2ML IJ SOLN
100.0000 ug | Freq: Once | INTRAMUSCULAR | Status: AC
  Administered 2015-07-29: 100 ug via INTRAVENOUS
  Filled 2015-07-29: qty 2

## 2015-07-29 MED ORDER — ONDANSETRON HCL 4 MG/2ML IJ SOLN
4.0000 mg | Freq: Once | INTRAMUSCULAR | Status: AC
  Administered 2015-07-29: 4 mg via INTRAVENOUS
  Filled 2015-07-29: qty 2

## 2015-07-29 MED ORDER — SODIUM CHLORIDE 0.9 % IV BOLUS (SEPSIS)
1000.0000 mL | Freq: Once | INTRAVENOUS | Status: AC
  Administered 2015-07-29: 1000 mL via INTRAVENOUS

## 2015-07-29 MED ORDER — SODIUM CHLORIDE 0.9 % IV SOLN
INTRAVENOUS | Status: DC
  Administered 2015-07-29: 23:00:00 via INTRAVENOUS

## 2015-07-29 NOTE — Progress Notes (Signed)
Patient ID: Eleanor Gatliff, female   DOB: 1961/07/26, 54 y.o.   MRN: [id number]  54 yo female with history of ulcerative colitis, status post total colectomy with J-pouch, type 1 DM with insulin pump -- returns to the ED for the second day in a row due to ongoing nonbloody watery diarrhea for the past 2 days with associated abdominal pain, left greater than right, episodes of diarrhea occurring approximately every 2 hours.   Patient states that she has not had any urination for the past 24 hours. She feels lightheaded and weak but has not passed out. No fevers. She had vomiting at onset of symptoms but now just residual nausea. She's been taking Zofran without relief. No chest pain, shortness of breath or cough. Onset of symptoms acute. Course is constant. Nothing makes symptoms better or worse.  Work up shows acute renal failure with ARF with creatinine of 3.58 mg/dL.  Accepted to [facility] to a telemetry bed.  Nephrology consult in AM.   Tennis Must, MD [phone]

## 2015-07-29 NOTE — ED Provider Notes (Signed)
CSN: [id number]     Arrival date & time 07/29/15  2008 History   First MD Initiated Contact with Patient 07/29/15 2101     Chief Complaint  Patient presents with  . Abdominal Cramping     (Consider location/radiation/quality/duration/timing/severity/associated sxs/prior Treatment) HPI Comments: Patient with history of ulcerative colitis status post total colectomy with J-pouch, type 1 DM with insulin pump -- presents with ongoing nonbloody watery diarrhea for the past 2 days with associated abdominal pain, left greater than right. Episodes of diarrhea occurring approximately every 2 hours. Patient states that she has not had any urination for the past 24 hours. She feels lightheaded and weak but has not passed out. No fevers. She had vomiting at onset of symptoms but now just residual nausea. She's been taking Zofran without relief. No chest pain, shortness of breath or cough. Onset of symptoms acute. Course is constant. Nothing makes symptoms better or worse.  Patient was seen in ED almost 48 hours ago. She had labs showing leukocytosis. She also had CT abdomen and pelvis with contrast at that time which was negative for acute process.  Patient is a 54 y.o. female presenting with cramps. The history is provided by the patient and medical records.  Abdominal Cramping Associated symptoms include abdominal pain and nausea. Pertinent negatives include no chest pain, coughing, fever, headaches, myalgias, rash, sore throat or vomiting.    Past Medical History  Diagnosis Date  . Ulcerative colitis, unspecified   . Type I (juvenile type) diabetes mellitus without mention of complication, not stated as uncontrolled   . Unspecified essential hypertension   . Fatty liver disease, nonalcoholic   . Dyslipidemia    Past Surgical History  Procedure Laterality Date  . Nasal sinus surgery  02/10/1994  . Appendectomy    . Protocolectomy  12/08/2005    total  . Ileostomy  06/17/2006    take down  .  Insulin pump     Family History  Problem Relation Age of Onset  . Cancer Father    Social History  Substance Use Topics  . Smoking status: Never Smoker   . Smokeless tobacco: None  . Alcohol Use: Yes   OB History    No data available     Review of Systems  Constitutional: Negative for fever.  HENT: Negative for rhinorrhea and sore throat.   Eyes: Negative for redness.  Respiratory: Negative for cough.   Cardiovascular: Negative for chest pain.  Gastrointestinal: Positive for nausea, abdominal pain and diarrhea. Negative for vomiting and blood in stool.  Genitourinary: Positive for difficulty urinating. Negative for dysuria and flank pain.  Musculoskeletal: Negative for myalgias.  Skin: Negative for rash.  Neurological: Positive for light-headedness. Negative for syncope and headaches.      Allergies  Aspartame; Clarithromycin; Codeine; Mesalamine; and Sulfonamide derivatives  Home Medications   Prior to Admission medications   Medication Sig Start Date End Date Taking? Authorizing Provider  aspirin 81 MG tablet Take 81 mg by mouth daily.      Historical Provider, MD  atorvastatin (LIPITOR) 80 MG tablet Take 80 mg by mouth daily.      Historical Provider, MD  cloNIDine (CATAPRES) 0.1 MG tablet Take 0.1 mg by mouth at bedtime.      Historical Provider, MD  Continuous Glucose Monitor DEVI Inject 1 Device into the skin once a week.    Historical Provider, MD  escitalopram (LEXAPRO) 20 MG tablet Take 20 mg by mouth daily.  Historical Provider, MD  famotidine (PEPCID) 20 MG tablet Take 20 mg by mouth daily.      Historical Provider, MD  fenofibrate 160 MG tablet Take 160 mg by mouth daily.      Historical Provider, MD  ferrous gluconate (FERGON) 325 MG tablet Take 325 mg by mouth daily with breakfast.      Historical Provider, MD  fluticasone (FLONASE) 50 MCG/ACT nasal spray Place into both nostrils daily.    Historical Provider, MD  GLUCAGEN HYPOKIT 1 MG SOLR injection  INJECT 1 MG INTO THE MUSCLE ONCE AS DIRECTED 04/09/15   Renato Shin, MD  HUMALOG 100 UNIT/ML injection USE IN INSULIN PUMP FOR A TOTAL OF 230 UNITS PER DAY 09/25/14   Renato Shin, MD  Insulin Infusion Pump Supplies (PARADIGM QUICK-SET 32" 9MM) MISC 1 Device by Does not apply route every 3 (three) days. 10/12/12   Renato Shin, MD  Insulin Infusion Pump Supplies (PARADIGM RESERVOIR 3ML) MISC 1 Device by Does not apply route every 3 (three) days. 10/12/12   Renato Shin, MD  insulin lispro (HUMALOG KWIKPEN) 100 UNIT/ML KiwkPen Use as directed if pump does not work 03/27/14   Renato Shin, MD  insulin lispro (HUMALOG) 100 UNIT/ML injection For use in pump, total of 230 units per day. 12/19/13   Renato Shin, MD  loperamide (IMODIUM) 2 MG capsule Take 1 mg by mouth 2 (two) times daily.     Historical Provider, MD  loratadine (CLARITIN) 10 MG tablet Take 10 mg by mouth daily.    Historical Provider, MD  medroxyPROGESTERone (DEPO-PROVERA) 150 MG/ML injection Inject 150 mg into the muscle every 3 (three) months.      Historical Provider, MD  Multiple Vitamin (MULTIVITAMIN) capsule Take 1 capsule by mouth daily.      Historical Provider, MD  ondansetron (ZOFRAN) 8 MG tablet Take 1 tablet (8 mg total) by mouth every 8 (eight) hours as needed for nausea or vomiting. 07/28/15   John Molpus, MD  ramipril (ALTACE) 2.5 MG capsule Take 2.5 mg by mouth 2 (two) times daily. Further refills through PCP. 11/20/11   Brand Males, MD   BP 96/67 mmHg  Pulse 103  Temp(Src) 98.5 F (36.9 C) (Oral)  Resp 20  Ht 5' 2"  (1.575 m)  Wt 67.586 kg  BMI 27.25 kg/m2  SpO2 94%   Physical Exam  Constitutional: She appears well-developed and well-nourished.  HENT:  Head: Normocephalic and atraumatic.  Mouth/Throat: Uvula is midline. Mucous membranes are dry.  Eyes: Conjunctivae are normal. Right eye exhibits no discharge. Left eye exhibits no discharge.  Neck: Normal range of motion. Neck supple.  Cardiovascular: Regular  rhythm and normal heart sounds.  Tachycardia present.   Tachycardia to 110 bpm  Pulmonary/Chest: Effort normal and breath sounds normal.  Abdominal: Soft. She exhibits no distension. There is tenderness. There is no rebound and no guarding.  Mild generalized abdominal pain, except moderate tenderness left lateral abdomen.  Neurological: She is alert.  Skin: Skin is warm and dry.  Psychiatric: She has a normal mood and affect.  Nursing note and vitals reviewed.   ED Course  Procedures (including critical care time) Labs Review Labs Reviewed  CBC WITH DIFFERENTIAL/PLATELET - Abnormal; Notable for the following:    RBC 5.24 (*)    All other components within normal limits  BASIC METABOLIC PANEL - Abnormal; Notable for the following:    Sodium 132 (*)    Chloride 98 (*)    CO2 19 (*)  Glucose, Bld 177 (*)    BUN 49 (*)    Creatinine, Ser 3.58 (*)    GFR calc non Af Amer 14 (*)    GFR calc Af Amer 16 (*)    All other components within normal limits  CBG MONITORING, ED - Abnormal; Notable for the following:    Glucose-Capillary 175 (*)    All other components within normal limits  URINALYSIS, ROUTINE W REFLEX MICROSCOPIC (NOT AT [facility])  I-STAT CG4 LACTIC ACID, ED    Imaging Review Ct Abdomen Pelvis W Contrast  07/28/2015  CLINICAL DATA:  Acute onset of generalized abdominal pain, nausea and vomiting. Diarrhea. Leukocytosis. Initial encounter. EXAM: CT ABDOMEN AND PELVIS WITH CONTRAST TECHNIQUE: Multidetector CT imaging of the abdomen and pelvis was performed using the standard protocol following bolus administration of intravenous contrast. CONTRAST:  137m OMNIPAQUE IOHEXOL 300 MG/ML  SOLN COMPARISON:  Abdominal radiograph performed 12/22/2005, and abdominal ultrasound performed 09/23/2004 FINDINGS: Minimal bibasilar atelectasis is noted. Mild fatty infiltration is noted within the liver, with peripheral sparing. Hepatomegaly is noted. The liver measures up to 27.2 cm in length. The  spleen is unremarkable in appearance. The patient is status post cholecystectomy, with clips noted at the gallbladder fossa. The pancreas and adrenal glands are unremarkable. Enlarged peripancreatic nodes are seen, measuring up to 2.2 cm in short axis. The kidneys are unremarkable in appearance. There is no evidence of hydronephrosis. No renal or ureteral stones are seen. No perinephric stranding is appreciated. No free fluid is identified. The small bowel is unremarkable in appearance. The stomach is within normal limits. No acute vascular abnormalities are seen. Minimal calcification is noted along the abdominal aorta. The patient is status post colectomy. The ileocolic anastomosis at the distal sigmoid colon is unremarkable in appearance. The bladder is mildly distended and grossly unremarkable. The uterus is grossly unremarkable. The ovaries are relatively symmetric. No suspicious adnexal masses are seen. No inguinal lymphadenopathy is seen. No acute osseous abnormalities are identified. Vacuum phenomenon is noted at L5-S1, with associated endplate sclerotic change. IMPRESSION: 1. No acute abnormality seen within the abdomen or pelvis. 2. Mild fatty infiltration noted within the liver. Hepatomegaly and splenomegaly noted. 3. Enlarged peripancreatic nodes, measuring up to 2.2 cm in short axis, of uncertain significance. Electronically Signed   By: JGarald BaldingM.D.   On: 07/28/2015 04:51   I have personally reviewed and evaluated these images and lab results as part of my medical decision-making.   EKG Interpretation None       9:14 PM Patient seen and examined. Work-up initiated. Medications ordered.   Vital signs reviewed and are as follows: BP 96/67 mmHg  Pulse 103  Temp(Src) 98.5 F (36.9 C) (Oral)  Resp 20  Ht 5' 2"  (1.575 m)  Wt 67.586 kg  BMI 27.25 kg/m2  SpO2 94%  10:36 PM lab work shows no acute kidney injury. Creatinine to 3.58. Anion gap of 13.  Patient informed of results. I  [facility]  MDM   Final diagnoses:  Acute kidney injury ([place]  Dehydration  Diarrhea, unspecified type   Patient with acute kidney injury. Suspect secondary to dehydration. Also consider contrast-induced nephropathy. Admit.     JCarlisle Cater PA-C 07/29/15 20349 MMalvin Johns MD 07/29/15 2245

## 2015-07-29 NOTE — ED Notes (Addendum)
Seen here Saturday for the same, dx'd with gastroenteritis, returns for "not sure if she is worse or just not feeling better", searching for answers, lists sx of abd pain, nausea, decreased U.O, diarrhea, last emesis Saturday. Has insulin pump, last cbg PTA was 141. Wt loss noted.

## 2015-07-30 ENCOUNTER — Encounter (HOSPITAL_COMMUNITY): Payer: Self-pay | Admitting: Behavioral Health

## 2015-07-30 DIAGNOSIS — R197 Diarrhea, unspecified: Secondary | ICD-10-CM | POA: Diagnosis present

## 2015-07-30 DIAGNOSIS — N179 Acute kidney failure, unspecified: Secondary | ICD-10-CM | POA: Diagnosis present

## 2015-07-30 DIAGNOSIS — R112 Nausea with vomiting, unspecified: Secondary | ICD-10-CM | POA: Diagnosis present

## 2015-07-30 LAB — GLUCOSE, CAPILLARY
GLUCOSE-CAPILLARY: 154 mg/dL — AB (ref 65–99)
GLUCOSE-CAPILLARY: 106 mg/dL — AB (ref 65–99)
GLUCOSE-CAPILLARY: 270 mg/dL — AB (ref 65–99)
GLUCOSE-CAPILLARY: 84 mg/dL (ref 65–99)
Glucose-Capillary: 211 mg/dL — ABNORMAL HIGH (ref 65–99)

## 2015-07-30 LAB — CBC WITH DIFFERENTIAL/PLATELET
BASOS ABS: 0 10*3/uL (ref 0.0–0.1)
BASOS PCT: 0 %
EOS ABS: 0.1 10*3/uL (ref 0.0–0.7)
Eosinophils Relative: 2 %
HEMATOCRIT: 39.6 % (ref 36.0–46.0)
HEMOGLOBIN: 13.1 g/dL (ref 12.0–15.0)
Lymphocytes Relative: 31 %
Lymphs Abs: 1.6 10*3/uL (ref 0.7–4.0)
MCH: 29.2 pg (ref 26.0–34.0)
MCHC: 33.1 g/dL (ref 30.0–36.0)
MCV: 88.4 fL (ref 78.0–100.0)
MONOS PCT: 11 %
Monocytes Absolute: 0.5 10*3/uL (ref 0.1–1.0)
NEUTROS ABS: 2.9 10*3/uL (ref 1.7–7.7)
NEUTROS PCT: 56 %
Platelets: 229 10*3/uL (ref 150–400)
RBC: 4.48 MIL/uL (ref 3.87–5.11)
RDW: 13.5 % (ref 11.5–15.5)
WBC: 5.1 10*3/uL (ref 4.0–10.5)

## 2015-07-30 LAB — COMPREHENSIVE METABOLIC PANEL
ALK PHOS: 75 U/L (ref 38–126)
ALT: 20 U/L (ref 14–54)
ANION GAP: 7 (ref 5–15)
AST: 26 U/L (ref 15–41)
Albumin: 3.5 g/dL (ref 3.5–5.0)
BUN: 31 mg/dL — ABNORMAL HIGH (ref 6–20)
CALCIUM: 8.7 mg/dL — AB (ref 8.9–10.3)
CO2: 23 mmol/L (ref 22–32)
CREATININE: 1.66 mg/dL — AB (ref 0.44–1.00)
Chloride: 107 mmol/L (ref 101–111)
GFR, EST AFRICAN AMERICAN: 40 mL/min — AB (ref >60)
GFR, EST NON AFRICAN AMERICAN: 34 mL/min — AB (ref >60)
Glucose, Bld: 136 mg/dL — ABNORMAL HIGH (ref 65–99)
Potassium: 4.2 mmol/L (ref 3.5–5.1)
SODIUM: 137 mmol/L (ref 135–145)
TOTAL PROTEIN: 7.3 g/dL (ref 6.5–8.1)
Total Bilirubin: 1.1 mg/dL (ref 0.3–1.2)

## 2015-07-30 LAB — LACTIC ACID, PLASMA: LACTIC ACID, VENOUS: 1 mmol/L (ref 0.5–2.0)

## 2015-07-30 LAB — URINE CULTURE

## 2015-07-30 MED ORDER — FENTANYL CITRATE (PF) 100 MCG/2ML IJ SOLN
100.0000 ug | INTRAMUSCULAR | Status: DC | PRN
  Administered 2015-07-30 (×6): 100 ug via INTRAVENOUS
  Filled 2015-07-30 (×6): qty 2

## 2015-07-30 MED ORDER — ATORVASTATIN CALCIUM 80 MG PO TABS
80.0000 mg | ORAL_TABLET | Freq: Every day | ORAL | Status: DC
  Administered 2015-07-30 – 2015-07-31 (×2): 80 mg via ORAL
  Filled 2015-07-30 (×2): qty 1

## 2015-07-30 MED ORDER — FAMOTIDINE 20 MG PO TABS
20.0000 mg | ORAL_TABLET | Freq: Every day | ORAL | Status: DC
  Administered 2015-07-30 – 2015-07-31 (×2): 20 mg via ORAL
  Filled 2015-07-30 (×2): qty 1

## 2015-07-30 MED ORDER — ONDANSETRON HCL 4 MG/2ML IJ SOLN: 4.0000 mg | Freq: Four times a day (QID) | INTRAMUSCULAR | Status: DC | PRN

## 2015-07-30 MED ORDER — ONDANSETRON HCL 4 MG PO TABS: 4.0000 mg | ORAL_TABLET | Freq: Four times a day (QID) | ORAL | Status: DC | PRN

## 2015-07-30 MED ORDER — FERROUS GLUCONATE 324 (38 FE) MG PO TABS
325.0000 mg | ORAL_TABLET | Freq: Every day | ORAL | Status: DC
  Administered 2015-07-30: 325 mg via ORAL
  Filled 2015-07-30 (×3): qty 1

## 2015-07-30 MED ORDER — ENOXAPARIN SODIUM 40 MG/0.4ML ~~LOC~~ SOLN
40.0000 mg | SUBCUTANEOUS | Status: DC
  Administered 2015-07-31: 40 mg via SUBCUTANEOUS
  Filled 2015-07-30: qty 0.4

## 2015-07-30 MED ORDER — INSULIN PUMP
SUBCUTANEOUS | Status: DC
  Administered 2015-07-30: 04:00:00 via SUBCUTANEOUS
  Administered 2015-07-30: 1 via SUBCUTANEOUS
  Administered 2015-07-30: 25 via SUBCUTANEOUS
  Administered 2015-07-30: 20:00:00 via SUBCUTANEOUS
  Administered 2015-07-31: 10 via SUBCUTANEOUS
  Administered 2015-07-31 (×2): via SUBCUTANEOUS
  Filled 2015-07-30: qty 1

## 2015-07-30 MED ORDER — LORATADINE 10 MG PO TABS
10.0000 mg | ORAL_TABLET | Freq: Every day | ORAL | Status: DC
  Administered 2015-07-30 – 2015-07-31 (×2): 10 mg via ORAL
  Filled 2015-07-30 (×2): qty 1

## 2015-07-30 MED ORDER — ENOXAPARIN SODIUM 30 MG/0.3ML ~~LOC~~ SOLN
30.0000 mg | SUBCUTANEOUS | Status: DC
  Administered 2015-07-30: 30 mg via SUBCUTANEOUS
  Filled 2015-07-30: qty 0.3

## 2015-07-30 MED ORDER — HYDRALAZINE HCL 20 MG/ML IJ SOLN: 10.0000 mg | INTRAMUSCULAR | Status: DC | PRN

## 2015-07-30 MED ORDER — ADULT MULTIVITAMIN W/MINERALS CH
1.0000 | ORAL_TABLET | Freq: Every day | ORAL | Status: DC
  Administered 2015-07-30 – 2015-07-31 (×2): 1 via ORAL
  Filled 2015-07-30 (×2): qty 1

## 2015-07-30 MED ORDER — ACETAMINOPHEN 325 MG PO TABS: 650.0000 mg | ORAL_TABLET | Freq: Four times a day (QID) | ORAL | Status: DC | PRN

## 2015-07-30 MED ORDER — ACETAMINOPHEN 650 MG RE SUPP: 650.0000 mg | Freq: Four times a day (QID) | RECTAL | Status: DC | PRN

## 2015-07-30 MED ORDER — SODIUM CHLORIDE 0.9 % IV SOLN: INTRAVENOUS | Status: DC

## 2015-07-30 MED ORDER — ESCITALOPRAM OXALATE 20 MG PO TABS
20.0000 mg | ORAL_TABLET | Freq: Every day | ORAL | Status: DC
  Administered 2015-07-30 – 2015-07-31 (×2): 20 mg via ORAL
  Filled 2015-07-30 (×2): qty 1

## 2015-07-30 MED ORDER — SODIUM CHLORIDE 0.9 % IV SOLN
INTRAVENOUS | Status: AC
  Administered 2015-07-30 – 2015-07-31 (×4): via INTRAVENOUS

## 2015-07-30 MED ORDER — FENOFIBRATE 160 MG PO TABS
160.0000 mg | ORAL_TABLET | Freq: Every day | ORAL | Status: DC
  Administered 2015-07-30 – 2015-07-31 (×2): 160 mg via ORAL
  Filled 2015-07-30 (×2): qty 1

## 2015-07-30 MED ORDER — CLONIDINE HCL 0.1 MG PO TABS
0.1000 mg | ORAL_TABLET | Freq: Every day | ORAL | Status: DC
  Administered 2015-07-30: 0.1 mg via ORAL
  Filled 2015-07-30: qty 1

## 2015-07-30 MED ORDER — FLUTICASONE PROPIONATE 50 MCG/ACT NA SUSP
2.0000 | Freq: Every day | NASAL | Status: DC
  Administered 2015-07-30 – 2015-07-31 (×2): 2 via NASAL
  Filled 2015-07-30 (×2): qty 16

## 2015-07-30 MED ORDER — ASPIRIN EC 81 MG PO TBEC
81.0000 mg | DELAYED_RELEASE_TABLET | Freq: Every day | ORAL | Status: DC
  Administered 2015-07-30 – 2015-07-31 (×2): 81 mg via ORAL
  Filled 2015-07-30 (×4): qty 1

## 2015-07-30 NOTE — Progress Notes (Signed)
New Admission Note  Arrival: via stretcher Mental Orientation: Alert & oriented x 4 Telemetry: Assessment: On tele Skin: no skin issues verified by 2nd RN kami IV: right AC infusing Pain: Meds given Safety measures:  verbalized understanding. Bed in lowest position.  Family:  No family at bedside. Orders have been reviewed and implemented. Will continue to monitor.

## 2015-07-30 NOTE — Progress Notes (Signed)
Inpatient Diabetes Program Recommendations  AACE/ADA: New Consensus Statement on Inpatient Glycemic Control (2015)  Target Ranges:  Prepandial:   less than 140 mg/dL      Peak postprandial:   less than 180 mg/dL (1-2 hours)      Critically ill patients:  140 - 180 mg/dL    Results for LOREDA, SILVERIO (MRN [id number]) as of 07/30/2015 11:30  Ref. Range 07/29/2015 21:01 07/30/2015 02:35 07/30/2015 07:30  Glucose-Capillary Latest Ref Range: 65-99 mg/dL 175 (H) 154 (H) 106 (H)    Admit with:  Diarrhea  History: Type 1 DM, HTN, Ulcerative Colitis  Home DM Meds: Insulin Pump  Current Insulin Orders: Insulin Pump Q4 hours    -Spoke with patient this AM.  Patient A&O and able to independently manage insulin pump.  Patient's daughter to bring extra insulin pump supplies to hospital today so patient can change her set/site this evening.  -Patient sees Dr. Delrae Rend with [facility] for insulin pump management.  -Discussed with patient her insulin pump settings.  Patient stated she was started on this new pump (Tslim) not long before she was admitted.  Was able to tell me that she has two basal settings and gets a total of 84 units basal insulin per 24 hour period.  Was not able to verbalize when her basal rates change but told me she has two basal rates: 3 units per hour and 5 units per hour for total of 84 units basal insulin per day.  Does not use Carbohydrate coverage ratio or a Sensitivity Factor.  Looks at her food and gauges how much she needs based on past experience.  Will take larger doses of food coverage for higher fat foods.  Stated to me that if her CBG were 300 mg/dl, she would likely take 25 units Humalog per her pump to start.  Target CBG is 90-110 mg/dl.  -Currently wearing continuous glucose monitor as well.  RNs are checking CBGs with hospital meter Q4 hours.  -Discussed documentation with RN caring for patient.     --Will follow patient during  hospitalization--  Wyn Quaker RN, MSN, CDE Diabetes Coordinator Inpatient Glycemic Control Team Team Pager: [phone] (8a-5p)

## 2015-07-30 NOTE — Progress Notes (Signed)
Utilization review completed.  

## 2015-07-30 NOTE — ED Notes (Signed)
[facility] for transfer.

## 2015-07-30 NOTE — H&P (Addendum)
[facility] History and Physical  Maison Agrusa MIW:[id number] DOB: 1960-11-19 DOA: 07/29/2015  Referring physician: Patient was transferred from [facility] [place]. PCP: Gennette Pac, MD  Specialists: Patient's gastroenterologist is at [facility].  Chief Complaint: Diarrhea.  HPI: Rhonda Gutierrez is a 54 y.o. female with history of ulcerative colitis status post total colectomy, diabetes mellitus type 1 on insulin pump, hypertension and hyperlipidemia presented to the ER because of persistent diarrhea. Patient had come to the ureter day earlier because of the same symptoms. Patient's symptoms started Friday 3 days ago with nausea and vomiting. Patient had come to the ER on Saturday the following day and had labs and CT abdomen and pelvis done which were unremarkable. Patient came last evening in because of persistent diarrhea. Labs at this time shows acute renal failure. Patient's abdominal pain is mostly across the abdomen crampy in nature and happens in episodes. Patient's diarrhea still persists and denies any blood in the diarrhea. Patient's vomiting has stopped over last 24 hours. Patient also has not had no urine output for last 24 hours but after admission patient has had some urine. Urine analysis does not show any casts.   Review of Systems: As presented in the history of presenting illness, rest negative.  Past Medical History  Diagnosis Date  . Ulcerative colitis, unspecified   . Type I (juvenile type) diabetes mellitus without mention of complication, not stated as uncontrolled   . Unspecified essential hypertension   . Fatty liver disease, nonalcoholic   . Dyslipidemia    Past Surgical History  Procedure Laterality Date  . Nasal sinus surgery  02/10/1994  . Appendectomy    . Protocolectomy  12/08/2005    total  . Ileostomy  06/17/2006    take down  . Insulin pump     Social History:  reports that she has never smoked. She does not have any  smokeless tobacco history on file. She reports that she drinks alcohol. She reports that she does not use illicit drugs. Where does patient live home. Can patient participate in ADLs? Yes.  Allergies  Allergen Reactions  . Aspartame   . Clarithromycin   . Codeine   . Mesalamine   . Sulfonamide Derivatives     Family History:  Family History  Problem Relation Age of Onset  . Cancer Father       Prior to Admission medications   Medication Sig Start Date End Date Taking? Authorizing Provider  aspirin 81 MG tablet Take 81 mg by mouth daily.      Historical Provider, MD  atorvastatin (LIPITOR) 80 MG tablet Take 80 mg by mouth daily.      Historical Provider, MD  cloNIDine (CATAPRES) 0.1 MG tablet Take 0.1 mg by mouth at bedtime.      Historical Provider, MD  Continuous Glucose Monitor DEVI Inject 1 Device into the skin once a week.    Historical Provider, MD  escitalopram (LEXAPRO) 20 MG tablet Take 20 mg by mouth daily.      Historical Provider, MD  famotidine (PEPCID) 20 MG tablet Take 20 mg by mouth daily.      Historical Provider, MD  fenofibrate 160 MG tablet Take 160 mg by mouth daily.      Historical Provider, MD  ferrous gluconate (FERGON) 325 MG tablet Take 325 mg by mouth daily with breakfast.      Historical Provider, MD  fluticasone (FLONASE) 50 MCG/ACT nasal spray Place into both nostrils daily.  Historical Provider, MD  GLUCAGEN HYPOKIT 1 MG SOLR injection INJECT 1 MG INTO THE MUSCLE ONCE AS DIRECTED 04/09/15   Renato Shin, MD  HUMALOG 100 UNIT/ML injection USE IN INSULIN PUMP FOR A TOTAL OF 230 UNITS PER DAY 09/25/14   Renato Shin, MD  Insulin Infusion Pump Supplies (PARADIGM QUICK-SET 32" 9MM) MISC 1 Device by Does not apply route every 3 (three) days. 10/12/12   Renato Shin, MD  Insulin Infusion Pump Supplies (PARADIGM RESERVOIR 3ML) MISC 1 Device by Does not apply route every 3 (three) days. 10/12/12   Renato Shin, MD  insulin lispro (HUMALOG KWIKPEN) 100 UNIT/ML  KiwkPen Use as directed if pump does not work 03/27/14   Renato Shin, MD  insulin lispro (HUMALOG) 100 UNIT/ML injection For use in pump, total of 230 units per day. 12/19/13   Renato Shin, MD  loperamide (IMODIUM) 2 MG capsule Take 1 mg by mouth 2 (two) times daily.     Historical Provider, MD  loratadine (CLARITIN) 10 MG tablet Take 10 mg by mouth daily.    Historical Provider, MD  medroxyPROGESTERone (DEPO-PROVERA) 150 MG/ML injection Inject 150 mg into the muscle every 3 (three) months.      Historical Provider, MD  Multiple Vitamin (MULTIVITAMIN) capsule Take 1 capsule by mouth daily.      Historical Provider, MD  ondansetron (ZOFRAN) 8 MG tablet Take 1 tablet (8 mg total) by mouth every 8 (eight) hours as needed for nausea or vomiting. 07/28/15   John Molpus, MD  ramipril (ALTACE) 2.5 MG capsule Take 2.5 mg by mouth 2 (two) times daily. Further refills through PCP. 11/20/11   Brand Males, MD    Physical Exam: Filed Vitals:   07/30/15 0015 07/30/15 0030 07/30/15 0123 07/30/15 0214  BP: 130/67 117/63 129/83 124/76  Pulse: 95 87 82 87  Temp:   98.2 F (36.8 C) 97.7 F (36.5 C)  TempSrc:   Oral Axillary  Resp: 18  16 18   Height:    5' 2"  (1.575 m)  Weight:    74.118 kg (163 lb 6.4 oz)  SpO2: 97% 95% 99% 98%     General:  Moderately built and nourished.  Eyes: Anicteric. No pallor.  ENT: No discharge from the ears eyes nose and mouth.  Neck: No mass felt. No neck rigidity. No JVD appreciated.  Cardiovascular: S1 and S2 heard.  Respiratory: No rhonchi or crepitations.  Abdomen: Soft nontender bowel sounds present.  Skin: No rash.  Musculoskeletal: No edema.  Psychiatric: Appears normal.  Neurologic: Alert awake oriented to time place and person. Moves all extremities.  Labs on Admission:  Basic Metabolic Panel:  Recent Labs Lab 07/28/15 0150 07/29/15 2051  NA 139 132*  K 4.8 4.1  CL 107 98*  CO2 17* 19*  GLUCOSE 138* 177*  BUN 27* 49*  CREATININE 0.92  3.58*  CALCIUM 10.0 9.5   Liver Function Tests:  Recent Labs Lab 07/28/15 0150  AST 56*  ALT 28  ALKPHOS 112  BILITOT 1.2  PROT 8.6*  ALBUMIN 4.9    Recent Labs Lab 07/28/15 0150  LIPASE 19   No results for input(s): AMMONIA in the last 168 hours. CBC:  Recent Labs Lab 07/28/15 0150 07/29/15 2051  WBC 18.4* 8.7  NEUTROABS 16.7* 6.0  HGB 13.8 14.7  HCT 42.4 45.1  MCV 87.8 86.1  PLT 317 343   Cardiac Enzymes: No results for input(s): CKTOTAL, CKMB, CKMBINDEX, TROPONINI in the last 168 hours.  BNP (last 3  results) No results for input(s): BNP in the last 8760 hours.  ProBNP (last 3 results) No results for input(s): PROBNP in the last 8760 hours.  CBG:  Recent Labs Lab 07/29/15 2101  GLUCAP 175*    Radiological Exams on Admission: No results found.   Assessment/Plan Principal Problem:   Nausea vomiting and diarrhea Active Problems:   Essential hypertension   Acute kidney injury ([place])   ARF (acute renal failure) (HCC)   Diarrhea   1. Nausea vomiting and diarrhea - suspect gastroenteritis. Denies any recent use of antibiotics. Check GI pathogen panel. Continue with hydration. Patient is was afebrile. Recent CAT scan done 2 days ago was unremarkable. Lactic acid also is within normal limits. 2. Acute renal failure with metabolic acidosis - probably from severe dehydration and patient also had received contrast 2 days ago for the CAT scan. In addition patient has been using Ace inhibitors. At this time we will continue with aggressive hydration and hold ramipril. Check FENa. Urine does not show any casts. Closely monitor intake output and metabolic panel.  3. Hypertension - hold ramipril due to renal failure. Continue clonidine. When necessary IV hydralazine for systolic blood pressure more than 160. 4. Hyperlipidemia on statins. 5. Diabetes mellitus type 1 on insulin pump - per protocol. 6. Ulcerative colitis status post total colectomy.  I have  reviewed patient's old charts were labs.   DVT Prophylaxis Lovenox.  Code Status: DO NOT RESUSCITATE. Family Communication: Discussed with patient.  Disposition Plan: Admit to inpatient.    Chaquana Nichols N. [facility] Pager [phone].  If 7PM-7AM, please contact night-coverage [email or web address] Password [email or web address] 07/30/2015, 4:14 AM

## 2015-07-30 NOTE — Progress Notes (Signed)
[facility] PROGRESS NOTE  Rhonda Gutierrez ZOX:[id number] DOB: 02-19-1961 DOA: 07/29/2015 PCP: Rhonda Pac, MD  Assessment/Plan: 1. Nausea, vomiting and diarrhea -Likely due to gastroenteritis. Denies recent antibiotic use, fever, and travel. -Patient was seen in the ED on 07/27/15 for the same and was given IV fluids, at that time blood work was normal and CT was negative. -Patient presented to ED on 07/30/15 with continuing abdominal pain and diarrhea. Lactic acid was within normal limits. -Patient tolerating clears, advance diet as tolerated. -Continue Zofran for nausea.  2. Acute renal failure with metabolic acidosis -Likely secondary to acute vomiting/diarrhea x 3 days, patient also received contrast for CT 2 days ago. -Creatinine on admission was 3.58, most recent 1.66. BUN on admission 49, most recent 31. Repeat labs in the AM -Continue IV fluids  3. Hypertension -Continue clonidine 0.1 mg by mouth at bedtime -Blood pressures are well controlled  4. Hyperlipidemia -Continue atorvastatin 80 mg by mouth daily  5. Diabetes mellitus -Patient on insulin pump -Blood sugars controlled, will continue monitoring.   Code Status: DO NOT RESUSCITATE Family Communication: Family not present Disposition Plan: Anticipate discharge in the next 24-48 hours, advancing her diet today to full liquid diet     HPI/Subjective: Patient reporting feeling a little better today, asking for diet to be advanced. She denies having further episodes of nausea vomiting.   Objective: Filed Vitals:   07/30/15 0500 07/30/15 0848  BP: 115/72 114/61  Pulse: 89 81  Temp: 98.2 F (36.8 C) 97.8 F (36.6 C)  Resp: 18 17    Intake/Output Summary (Last 24 hours) at 07/30/15 1351 Last data filed at 07/30/15 1343  Gross per 24 hour  Intake   2720 ml  Output   1000 ml  Net   1720 ml   Filed Weights   07/29/15 2040 07/29/15 2045 07/30/15 0214  Weight: 74.844 kg (165 lb) 67.586 kg  (149 lb) 74.118 kg (163 lb 6.4 oz)    Exam:   General:  She is awake and alert, nontoxic appearing in no acute distress, thinks she is getting better  Cardiovascular: Regular rate rhythm normal S1-S2 no murmurs or gallops  Respiratory: Normal respiratory effort, lungs are clear to auscultation bilaterally  Abdomen: Her abdomen was soft, I do not elicit tenderness to palpation across generalized abdomen, no palpable masses present. Insulin pump in place.  Musculoskeletal: No edema  Data Reviewed: Basic Metabolic Panel:  Recent Labs Lab 07/28/15 0150 07/29/15 2051 07/30/15 0448  NA 139 132* 137  K 4.8 4.1 4.2  CL 107 98* 107  CO2 17* 19* 23  GLUCOSE 138* 177* 136*  BUN 27* 49* 31*  CREATININE 0.92 3.58* 1.66*  CALCIUM 10.0 9.5 8.7*   Liver Function Tests:  Recent Labs Lab 07/28/15 0150 07/30/15 0448  AST 56* 26  ALT 28 20  ALKPHOS 112 75  BILITOT 1.2 1.1  PROT 8.6* 7.3  ALBUMIN 4.9 3.5    Recent Labs Lab 07/28/15 0150  LIPASE 19   No results for input(s): AMMONIA in the last 168 hours. CBC:  Recent Labs Lab 07/28/15 0150 07/29/15 2051 07/30/15 0448  WBC 18.4* 8.7 5.1  NEUTROABS 16.7* 6.0 2.9  HGB 13.8 14.7 13.1  HCT 42.4 45.1 39.6  MCV 87.8 86.1 88.4  PLT 317 343 229   Cardiac Enzymes: No results for input(s): CKTOTAL, CKMB, CKMBINDEX, TROPONINI in the last 168 hours. BNP (last 3 results) No results for input(s): BNP in the last 8760 hours.  ProBNP (  last 3 results) No results for input(s): PROBNP in the last 8760 hours.  CBG:  Recent Labs Lab 07/29/15 2101 07/30/15 0235 07/30/15 0730 07/30/15 1130  GLUCAP 175* 154* 106* 270*    Recent Results (from the past 240 hour(s))  Urine culture     Status: None   Collection Time: 07/28/15  2:43 AM  Result Value Ref Range Status   Specimen Description URINE, CLEAN CATCH  Final   Special Requests NONE  Final   Culture   Final    MULTIPLE SPECIES PRESENT, SUGGEST RECOLLECTION Performed at  [facility]    Report Status 07/30/2015 FINAL  Final     Studies: No results found.  Scheduled Meds: . aspirin EC  81 mg Oral Daily  . atorvastatin  80 mg Oral Daily  . cloNIDine  0.1 mg Oral QHS  . enoxaparin (LOVENOX) injection  30 mg Subcutaneous Q24H  . escitalopram  20 mg Oral Daily  . famotidine  20 mg Oral Daily  . fenofibrate  160 mg Oral Daily  . ferrous gluconate  325 mg Oral Q breakfast  . fluticasone  2 spray Each Nare Daily  . insulin pump   Subcutaneous 6 times per day  . loratadine  10 mg Oral Daily  . multivitamin with minerals  1 tablet Oral Daily   Continuous Infusions: . sodium chloride 125 mL/hr at 07/30/15 0459    Principal Problem:   Nausea vomiting and diarrhea Active Problems:   Essential hypertension   Acute kidney injury ([place])   ARF (acute renal failure) ([place])   Diarrhea    Time spent: 30 min    Lawernce Keas  [facility] Pager [phone] 7PM-7AM, please contact night-coverage at [email or web address], password [facility] 07/30/2015, 1:51 PM  LOS: 1 day     Addendum  I personally evaluated patient on 07/30/2015 and agree with above findings. Patient is a pleasant 54 year old female with a history of ulcerative colitis status post total colectomy, type 1 diabetes mellitus who is on an insulin pump, admitted to the medicine service on 07/29/2015 when she presented with complaints of nausea vomiting associated with diarrhea. She denied hematemesis or bloody stools. She also denied sick contacts, recent travel, or eating undercooked meats. She did report going out to a restaurant last week. Lab work revealed acute kidney injury with creatinine of 3.58 and BUN of 49. Acute kidney injury likely secondary to dehydration from GI losses. After IV fluid resuscitation overnight creatinine is now trend down to 1.66 with BUN of 31. She states feeling better today will like her diet advanced. I placed order for a full liquid diet. I think she is getting  better and will likely be discharged to the next 24 hours.

## 2015-07-31 DIAGNOSIS — I10 Essential (primary) hypertension: Secondary | ICD-10-CM

## 2015-07-31 DIAGNOSIS — N179 Acute kidney failure, unspecified: Secondary | ICD-10-CM

## 2015-07-31 LAB — GLUCOSE, CAPILLARY
Glucose-Capillary: 67 mg/dL (ref 65–99)
Glucose-Capillary: 157 mg/dL — ABNORMAL HIGH (ref 65–99)
Glucose-Capillary: 83 mg/dL (ref 65–99)
Glucose-Capillary: 141 mg/dL — ABNORMAL HIGH (ref 65–99)

## 2015-07-31 LAB — BASIC METABOLIC PANEL
Anion gap: 7 (ref 5–15)
BUN: 11 mg/dL (ref 6–20)
CHLORIDE: 107 mmol/L (ref 101–111)
CO2: 26 mmol/L (ref 22–32)
Calcium: 8.6 mg/dL — ABNORMAL LOW (ref 8.9–10.3)
Creatinine, Ser: 0.7 mg/dL (ref 0.44–1.00)
GFR calc Af Amer: >60 mL/min (ref >60)
GFR calc non Af Amer: >60 mL/min (ref >60)
GLUCOSE: 95 mg/dL (ref 65–99)
POTASSIUM: 3.8 mmol/L (ref 3.5–5.1)
Sodium: 140 mmol/L (ref 135–145)

## 2015-07-31 LAB — CBC
HEMATOCRIT: 37 % (ref 36.0–46.0)
HEMOGLOBIN: 11.6 g/dL — AB (ref 12.0–15.0)
MCH: 28 pg (ref 26.0–34.0)
MCHC: 31.4 g/dL (ref 30.0–36.0)
MCV: 89.2 fL (ref 78.0–100.0)
Platelets: 215 10*3/uL (ref 150–400)
RBC: 4.15 MIL/uL (ref 3.87–5.11)
RDW: 13.2 % (ref 11.5–15.5)
WBC: 4.7 10*3/uL (ref 4.0–10.5)

## 2015-07-31 MED ORDER — FERROUS SULFATE 325 (65 FE) MG PO TABS
325.0000 mg | ORAL_TABLET | Freq: Every day | ORAL | Status: DC
  Administered 2015-07-31: 325 mg via ORAL
  Filled 2015-07-31: qty 1

## 2015-07-31 NOTE — Progress Notes (Signed)
Discharged on a stable condition,no complaint,no open skin issues.Questions answered satisfactorily at the time of discharged.Reminded patient of her pending medical appointment.

## 2015-07-31 NOTE — Discharge Summary (Addendum)
Physician Discharge Summary  Rhonda Gutierrez ZOX:[id number] DOB: July 26, 1961 DOA: 07/29/2015  PCP: Rhonda Pac, MD  Admit date: 07/29/2015 Discharge date: 08/12/2015  Time spent: 35 minutes  Recommendations for Outpatient Follow-up:  1. Patient treated for dehydration in setting of GI losses, please follow up on BMP on hospital follow up    Discharge Diagnoses:  Principal Problem:   Nausea vomiting and diarrhea Active Problems:   Essential hypertension   Acute kidney injury ([place])   ARF (acute renal failure) ([place])   Diarrhea   Gastroenteritis presumed infectious   Discharge Condition: Stable  Diet recommendation: Regular diet  Filed Weights   07/29/15 2045 07/30/15 0214 07/30/15 2034  Weight: 67.586 kg (149 lb) 74.118 kg (163 lb 6.4 oz) 74.1 kg (163 lb 5.8 oz)    History of present illness:  Rhonda Gutierrez is a 54 y.o. female with history of ulcerative colitis status post total colectomy, diabetes mellitus type 1 on insulin pump, hypertension and hyperlipidemia presented to the ER because of persistent diarrhea. Patient had come to the ureter day earlier because of the same symptoms. Patient's symptoms started Friday 3 days ago with nausea and vomiting. Patient had come to the ER on Saturday the following day and had labs and CT abdomen and pelvis done which were unremarkable. Patient came last evening in because of persistent diarrhea. Labs at this time shows acute renal failure. Patient's abdominal pain is mostly across the abdomen crampy in nature and happens in episodes. Patient's diarrhea still persists and denies any blood in the diarrhea. Patient's vomiting has stopped over last 24 hours. Patient also has not had no urine output for last 24 hours but after admission patient has had some urine. Urine analysis does not show any casts.   Hospital Course:  I personally evaluated patient on 07/30/2015 and agree with above findings. Patient is a pleasant 54 year old  female with a history of ulcerative colitis status post total colectomy, type 1 diabetes mellitus who is on an insulin pump, admitted to the medicine service on 07/29/2015 when she presented with complaints of nausea vomiting associated with diarrhea. She denied hematemesis or bloody stools. She also denied sick contacts, recent travel, or eating undercooked meats. She did report going out to a restaurant last week. Lab work revealed acute kidney injury with creatinine of 3.58 and BUN of 49. Acute kidney injury likely secondary to dehydration from GI losses. After IV fluid resuscitation overnight creatinine is now trend down to 1.66 with BUN of 31. She stated feeling better as her diet advanced.  By 07/31/2015 her renal failure resolved. Creatinine coming down to  0.7 with BUN of 11. She was tolerating by mouth intake and ambulating down the hallway and reported feeling well enough to go home on this date. She was discharged in stable condition instructed follow with her primary care physician in 1-2 weeks.   Discharge Exam: Filed Vitals:   07/31/15 0416 07/31/15 0951  BP: 101/58 108/60  Pulse: 68 75  Temp: 98 F (36.7 C) 98.7 F (37.1 C)  Resp: 17 18    General: Nontoxic appearing and in no acute distress, awake and alert. Asking to go home Cardiovascular: RRR Nl S1S2, no edema Respiratory: NL inspiratory effort, lungs clear to auscultation Abdomen: Soft, nontender nondistended  Discharge Instructions   Discharge Instructions    Call MD for:  difficulty breathing, headache or visual disturbances    Complete by:  As directed      Call MD for:  extreme fatigue    Complete by:  As directed      Call MD for:  hives    Complete by:  As directed      Call MD for:  persistant dizziness or light-headedness    Complete by:  As directed      Call MD for:  persistant nausea and vomiting    Complete by:  As directed      Call MD for:  redness, tenderness, or signs of infection (pain, swelling,  redness, odor or green/yellow discharge around incision site)    Complete by:  As directed      Call MD for:  severe uncontrolled pain    Complete by:  As directed      Call MD for:  temperature >100.4    Complete by:  As directed      Call MD for:    Complete by:  As directed      Diet - low sodium heart healthy    Complete by:  As directed      Increase activity slowly    Complete by:  As directed           Discharge Medication List as of 07/31/2015  2:06 PM    CONTINUE these medications which have NOT CHANGED   Details  aspirin 81 MG tablet Take 81 mg by mouth daily.  , Until Discontinued, Historical Med    atorvastatin (LIPITOR) 80 MG tablet Take 80 mg by mouth daily.  , Until Discontinued, Historical Med    cloNIDine (CATAPRES) 0.1 MG tablet Take 0.1 mg by mouth at bedtime.  , Until Discontinued, Historical Med    Continuous Glucose Monitor DEVI Inject 1 Device into the skin once a week., Until Discontinued, Historical Med    escitalopram (LEXAPRO) 20 MG tablet Take 20 mg by mouth daily.  , Until Discontinued, Historical Med    famotidine (PEPCID) 20 MG tablet Take 20 mg by mouth daily.  , Until Discontinued, Historical Med    fenofibrate 160 MG tablet Take 160 mg by mouth daily.  , Until Discontinued, Historical Med    ferrous gluconate (FERGON) 325 MG tablet Take 325 mg by mouth daily with breakfast.  , Until Discontinued, Historical Med    !! Insulin Infusion Pump Supplies (PARADIGM QUICK-SET 32" 9MM) MISC 1 Device by Does not apply route every 3 (three) days., Starting 10/12/2012, Until Discontinued, Normal    !! Insulin Infusion Pump Supplies (PARADIGM RESERVOIR 3ML) MISC 1 Device by Does not apply route every 3 (three) days., Starting 10/12/2012, Until Discontinued, Normal    insulin lispro (HUMALOG KWIKPEN) 100 UNIT/ML KiwkPen Use as directed if pump does not work, Normal    loperamide (IMODIUM) 2 MG capsule Take 8 mg by mouth 2 (two) times daily. , Until  Discontinued, Historical Med    loratadine (CLARITIN) 10 MG tablet Take 10 mg by mouth daily., Until Discontinued, Historical Med    Multiple Vitamin (MULTIVITAMIN) capsule Take 1 capsule by mouth daily.  , Until Discontinued, Historical Med    ondansetron (ZOFRAN) 8 MG tablet Take 1 tablet (8 mg total) by mouth every 8 (eight) hours as needed for nausea or vomiting., Starting 07/28/2015, Until Discontinued, Print    ramipril (ALTACE) 2.5 MG capsule Take 2.5 mg by mouth 2 (two) times daily. Further refills through PCP., Starting 11/20/2011, Until Discontinued, Historical Med    fluticasone (FLONASE) 50 MCG/ACT nasal spray Place into both nostrils daily., Until Discontinued, Historical Med  GLUCAGEN HYPOKIT 1 MG SOLR injection INJECT 1 MG INTO THE MUSCLE ONCE AS DIRECTED, Normal    HUMALOG 100 UNIT/ML injection USE IN INSULIN PUMP FOR A TOTAL OF 230 UNITS PER DAY, Normal    medroxyPROGESTERone (DEPO-PROVERA) 150 MG/ML injection Inject 150 mg into the muscle every 3 (three) months.  , Until Discontinued, Historical Med     !! - Potential duplicate medications found. Please discuss with provider.     Allergies  Allergen Reactions  . Aspartame     Shaking   . Codeine Nausea And Vomiting  . Mesalamine     Seen things  . Clarithromycin Rash  . Sulfonamide Derivatives Rash    fever      The results of significant diagnostics from this hospitalization (including imaging, microbiology, ancillary and laboratory) are listed below for reference.    Significant Diagnostic Studies: Ct Abdomen Pelvis W Contrast  07/28/2015  CLINICAL DATA:  Acute onset of generalized abdominal pain, nausea and vomiting. Diarrhea. Leukocytosis. Initial encounter. EXAM: CT ABDOMEN AND PELVIS WITH CONTRAST TECHNIQUE: Multidetector CT imaging of the abdomen and pelvis was performed using the standard protocol following bolus administration of intravenous contrast. CONTRAST:  133m OMNIPAQUE IOHEXOL 300 MG/ML   SOLN COMPARISON:  Abdominal radiograph performed 12/22/2005, and abdominal ultrasound performed 09/23/2004 FINDINGS: Minimal bibasilar atelectasis is noted. Mild fatty infiltration is noted within the liver, with peripheral sparing. Hepatomegaly is noted. The liver measures up to 27.2 cm in length. The spleen is unremarkable in appearance. The patient is status post cholecystectomy, with clips noted at the gallbladder fossa. The pancreas and adrenal glands are unremarkable. Enlarged peripancreatic nodes are seen, measuring up to 2.2 cm in short axis. The kidneys are unremarkable in appearance. There is no evidence of hydronephrosis. No renal or ureteral stones are seen. No perinephric stranding is appreciated. No free fluid is identified. The small bowel is unremarkable in appearance. The stomach is within normal limits. No acute vascular abnormalities are seen. Minimal calcification is noted along the abdominal aorta. The patient is status post colectomy. The ileocolic anastomosis at the distal sigmoid colon is unremarkable in appearance. The bladder is mildly distended and grossly unremarkable. The uterus is grossly unremarkable. The ovaries are relatively symmetric. No suspicious adnexal masses are seen. No inguinal lymphadenopathy is seen. No acute osseous abnormalities are identified. Vacuum phenomenon is noted at L5-S1, with associated endplate sclerotic change. IMPRESSION: 1. No acute abnormality seen within the abdomen or pelvis. 2. Mild fatty infiltration noted within the liver. Hepatomegaly and splenomegaly noted. 3. Enlarged peripancreatic nodes, measuring up to 2.2 cm in short axis, of uncertain significance. Electronically Signed   By: JGarald BaldingM.D.   On: 07/28/2015 04:51    Microbiology: No results found for this or any previous visit (from the past 240 hour(s)).   Labs: Basic Metabolic Panel: No results for input(s): NA, K, CL, CO2, GLUCOSE, BUN, CREATININE, CALCIUM, MG, PHOS in the last  168 hours. Liver Function Tests: No results for input(s): AST, ALT, ALKPHOS, BILITOT, PROT, ALBUMIN in the last 168 hours. No results for input(s): LIPASE, AMYLASE in the last 168 hours. No results for input(s): AMMONIA in the last 168 hours. CBC: No results for input(s): WBC, NEUTROABS, HGB, HCT, MCV, PLT in the last 168 hours. Cardiac Enzymes: No results for input(s): CKTOTAL, CKMB, CKMBINDEX, TROPONINI in the last 168 hours. BNP: BNP (last 3 results) No results for input(s): BNP in the last 8760 hours.  ProBNP (last 3 results) No results for input(s):  PROBNP in the last 8760 hours.  CBG: No results for input(s): GLUCAP in the last 168 hours.     SignedKelvin Cellar  [facility] 08/12/2015, 5:05 PM

## 2015-07-31 NOTE — Care Management Note (Signed)
Case Management Note  Patient Details  Name: Rhonda Gutierrez MRN: [id number] Date of Birth: 09-Sep-1960  Subjective/Objective:         CM following for progression and d/c planning.           Action/Plan: No HH or DME needs identified, pt independent.   Expected Discharge Date:       07/30/2014           Expected Discharge Plan:  Home/Self Care  In-House Referral:  NA  Discharge planning Services  NA  Post Acute Care Choice:  NA Choice offered to:  NA  DME Arranged:   NA DME Agency:   NA  HH Arranged:   NA HH Agency:   NA  Status of Service:  Completed, signed off  Medicare Important Message Given:    Date Medicare IM Given:    Medicare IM give by:    Date Additional Medicare IM Given:    Additional Medicare Important Message give by:     If discussed at [place] of Stay Meetings, dates discussed:    Additional Comments:  Adron Bene, RN 07/31/2015, 11:06 AM

## 2015-08-12 DIAGNOSIS — K529 Noninfective gastroenteritis and colitis, unspecified: Secondary | ICD-10-CM | POA: Diagnosis present

## 2016-04-30 ENCOUNTER — Other Ambulatory Visit: Payer: Self-pay

## 2016-04-30 ENCOUNTER — Ambulatory Visit (HOSPITAL_COMMUNITY): Payer: BLUE CROSS/BLUE SHIELD | Attending: Cardiovascular Disease

## 2016-04-30 ENCOUNTER — Other Ambulatory Visit: Payer: Self-pay | Admitting: Family Medicine

## 2016-04-30 DIAGNOSIS — R011 Cardiac murmur, unspecified: Secondary | ICD-10-CM | POA: Diagnosis not present

## 2016-04-30 DIAGNOSIS — R5383 Other fatigue: Secondary | ICD-10-CM

## 2016-04-30 DIAGNOSIS — E119 Type 2 diabetes mellitus without complications: Secondary | ICD-10-CM | POA: Insufficient documentation

## 2016-04-30 DIAGNOSIS — E785 Hyperlipidemia, unspecified: Secondary | ICD-10-CM | POA: Insufficient documentation

## 2016-04-30 DIAGNOSIS — I10 Essential (primary) hypertension: Secondary | ICD-10-CM | POA: Insufficient documentation

## 2016-05-01 ENCOUNTER — Other Ambulatory Visit: Payer: Self-pay | Admitting: Family Medicine

## 2016-05-01 DIAGNOSIS — R011 Cardiac murmur, unspecified: Secondary | ICD-10-CM

## 2016-05-01 DIAGNOSIS — R5383 Other fatigue: Secondary | ICD-10-CM

## 2016-07-02 HISTORY — PX: CARPAL TUNNEL RELEASE: SHX101

## 2017-03-24 ENCOUNTER — Ambulatory Visit
Admission: RE | Admit: 2017-03-24 | Discharge: 2017-03-24 | Disposition: A | Discharge status: Home / Self Care | Payer: BLUE CROSS/BLUE SHIELD | Source: Ambulatory Visit | Attending: Internal Medicine | Admitting: Internal Medicine

## 2017-03-24 ENCOUNTER — Other Ambulatory Visit: Payer: Self-pay | Admitting: Internal Medicine

## 2017-03-24 DIAGNOSIS — M25541 Pain in joints of right hand: Secondary | ICD-10-CM

## 2017-03-24 DIAGNOSIS — M25542 Pain in joints of left hand: Secondary | ICD-10-CM

## 2017-06-12 NOTE — Progress Notes (Signed)
Office Visit Note  Patient: Rhonda Gutierrez             Date of Birth: 1960-10-05           MRN: [id number]             PCP: Hulan Fess, MD Referring: Delrae Rend, MD Visit Date: 06/17/2017 Occupation: Self-employed. Lawn maintenance    Subjective:  Pain in multiple joints.   History of Present Illness: Rhonda Gutierrez is a 56 y.o. female seen in consultation per request of Dr. Buddy Duty. According to patient her symptoms started in 2006 with diarrhea at the time she had colonoscopy and was diagnosed with ulcerative colitis. She was given some oral medications without response. She went to see Dr. Renee Harder at [facility] who did a colonoscopy and tried to Remicade which failed. In April 2007 she had colorectal surgery and dig well after that. She states the ulcerative colitis is resolved but she still has diarrhea. In September 2007 she was diagnosed with type 1 diabetes. She states in May 2018 she noticed a little blister on her left fifth finger DIP joint. She also was started having discomfort in her left thumb which she related to texturing for some time. She has pain in her bilateral hands. She also gives history of pain and discomfort in her bilateral lower extremities which she describes in the joints and the muscles. She has difficulty walking at times. She has had history of plantar fasciitis about 2 episodes per year for the last several years. She's been seeing a podiatrist for that. She's been using orthotics and gabapentin which gives her relief. Last year she had episode of neck pain for which she was seen in the emergency room. According to patient the MRI showed bulging disc and she was referred to neurosurgery. The neurosurgery was not observed advised. She went through physical therapy . She still have some stiffness in her neck.  Activities of Daily Living:  Patient reports morning stiffness for 45 minutes.   Patient Reports nocturnal pain.  Difficulty dressing/grooming:  Denies Difficulty climbing stairs: Denies Difficulty getting out of chair: Denies Difficulty using hands for taps, buttons, cutlery, and/or writing: Reports   Review of Systems  Constitutional: Positive for fatigue. Negative for weakness.  HENT: Negative.  Negative for mouth dryness.   Eyes: Negative.  Negative for dryness.  Respiratory: Negative.  Negative for cough and shortness of breath.   Cardiovascular: Negative.  Negative for chest pain, palpitations, hypertension and swelling in legs/feet.  Gastrointestinal: Positive for diarrhea. Negative for blood in stool and constipation.  Musculoskeletal: Positive for arthralgias, joint pain, morning stiffness and muscle tenderness. Negative for joint swelling, myalgias, muscle weakness and myalgias.  Skin: Positive for hair loss. Negative for rash and sensitivity to sunlight.  Neurological: Negative for dizziness, numbness and headaches.  Psychiatric/Behavioral: Positive for depressed mood and sleep disturbance.    PMFS History:  Patient Active Problem List   Diagnosis Date Noted  . Gastroenteritis presumed infectious 08/12/2015  . Nausea vomiting and diarrhea 07/30/2015  . ARF (acute renal failure) ([place]) 07/30/2015  . Diarrhea 07/30/2015  . Acute kidney injury ([place]) 07/29/2015  . Pain in joint, ankle and foot 05/03/2013  . Type I (juvenile type) diabetes mellitus with renal manifestations, not stated as uncontrolled(250.41) 01/11/2013  . Carpal tunnel syndrome, bilateral 10/07/2011  . Cough 07/09/2010  . PROTEINURIA, MILD 04/10/2009  . DISTURBANCE OF SKIN SENSATION 01/02/2009  . HYPERLIPIDEMIA 11/21/2008  . RENAL INSUFFICIENCY 11/21/2008  .  Ulcerative colitis ([place]) 03/06/2008  . FATTY LIVER DISEASE 03/06/2008  . Essential hypertension 04/14/2007    Past Medical History:  Diagnosis Date  . Dyslipidemia   . Fatty liver disease, nonalcoholic   . Type I (juvenile type) diabetes mellitus without mention of complication, not stated  as uncontrolled   . Ulcerative colitis, unspecified   . Unspecified essential hypertension     Family History  Problem Relation Age of Onset  . Cancer Father    Past Surgical History:  Procedure Laterality Date  . APPENDECTOMY    . ILEOSTOMY  06/17/2006   take down  . Insulin pump    . NASAL SINUS SURGERY  02/10/1994  . 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 She believes that when she returns to work with much more activity, her glucoses will come down some more.     Objective: Vital Signs: BP 136/73 (BP Location: Left Arm, Patient Position: Sitting, Cuff Size: Normal)   Pulse 78   Ht _0  (1.575 m)   Wt 167 lb (75.8 kg)   BMI 30.54 kg/m    Physical Exam  Constitutional: She is oriented to person, place, and time. She appears well-developed and well-nourished.  HENT:  Head: Normocephalic and atraumatic.  Eyes: Conjunctivae and EOM are normal.  Neck: Normal range of motion.  Cardiovascular: Normal rate, regular rhythm, normal heart sounds and intact distal pulses.   Pulmonary/Chest: Effort normal and breath sounds normal.  Abdominal: Soft. Bowel sounds are normal.  Lymphadenopathy:    She has no cervical adenopathy.  Neurological: She is alert and oriented to person, place, and time.  Skin: Skin is warm and dry. Capillary refill takes less than 2 seconds.  Hair thinning  Psychiatric: She has a normal mood and affect. Her behavior is normal.  Nursing note and vitals reviewed.    Musculoskeletal Exam: C-spine and thoracic lumbar spine limited range of motion. She has mild thoracic kyphosis. Shoulder joints elbow joints wrist joints MCPs with good range of motion with no synovitis. She has some DIP PIP thickening bilaterally consistent with osteoarthritis but did not see any inflammatory component. She has tenderness on palpation over  bilateral SI joints. Hip joints knee joints ankles MTPs PIPs DIPs with good range of motion. She has discomfort with range of motion of bilateral knee joints without any warmth swelling or effusion. She had no evidence of plantar fasciitis on examination today.  CDAI Exam: No CDAI exam completed.    Investigation: Findings:  03/24/17 RF elevated 45, S.Ferritin normal 31.5, UA Normal, CBC Normal 03/03/17 CMP Normal    Imaging: Xr Hand 2 View Left  Result Date: 06/17/2017 PIP/DIP narrowing was noted. No significant narrowing was noted in the fifth finger DIP joint. No MCP or intercarpal joint space narrowing or erosive changes were noted. Impression: These findings are consistent with osteoarthritis of the hand.  Xr Hand 2 View Right  Result Date: 06/17/2017 PIP/DIP narrowing was noted. No MCP or intercarpal joint space narrowing was noted. No erosive changes were noted. Impression: These findings are consistent with early osteoarthritis.  Xr Knee 3 View Left  Result Date: 06/17/2017 Mild medial compartment narrowing was noted. Mild patellofemoral narrowing was noted. No chondrocalcinosis was noted. 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  Xr Knee 3 View Right  Result Date:  06/17/2017 Mild medial compartment narrowing was noted. Mild patellofemoral narrowing was noted. No chondrocalcinosis was noted. 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  Xr Pelvis 1-2 Views  Result Date: 06/17/2017 No SI joint narrowing and sclerosis was noted. She is some inferior spurring.   Speciality Comments: No specialty comments available.    Procedures:  No procedures performed Allergies: Aspartame; Codeine; Mesalamine; Clarithromycin; and Sulfonamide derivatives   Assessment / Plan:     Visit Diagnoses: Rheumatoid factor positive: Patient has no synovitis on examination today. I will check anti-CCP antibody and ESR as  well today. I've notified patient if she develops any joint swelling and would like to see her again. I will schedule ultrasound of bilateral hands to rule out synovitis. Pain in both hands - Plan: XR Hand 2 View Right, XR Hand 2 View Left. X-rays were consistent with mild osteoarthritis  Status post bilateral carpal tunnel release: Currently asymptomatic.  Chronic pain of both knees. No warmth swelling or effusion was noted. - Plan: XR KNEE 3 VIEW RIGHT, XR KNEE 3 VIEW LEFT. X-rays were consistent with mild osteoarthritis and mild chondromalacia patella.  Chronic SI joint pain. She does have some tenderness over SI joints. - Plan: XR Pelvis 1-2 Views . X-rays were unremarkable with inferior spurring only.  Plantar fasciitis - History of plantar fasciitis in the past. About 2 episodes per year. She has been seeing a podiatrist. She's been using orthotics and gabapentin which is been helpful.  DDD (degenerative disc disease), cervical: She has discomfort off and on. She had good range of motion of her C-spine today.  Myalgia: She complains of myalgias in her lower extremities. I will check CK and vitamin D today.  Other ulcerative colitis without complication ([place]) - Status post ileostomy and proctectomy 2007. Patient states that she has post cholectomy diarrhea and does have to take Imodium several times a day.  Her other medical problems are listed as follows:  History of colectomy  History of fatty infiltration of liver  History of diabetes mellitus - type1  History of chronic kidney disease/ Dr Brayton El manages   History of anxiety  History of hyperlipidemia  History of hypertension  History of gastroesophageal reflux (GERD) - Followed up by Dr. Renee [facility] at [facility]  History of anemia - [facility]     Orders: Orders Placed This Encounter  Procedures  . XR Hand 2 View Right  . XR Hand 2 View Left  . XR KNEE 3 VIEW RIGHT  . XR KNEE 3 VIEW LEFT  . XR Pelvis 1-2 Views  .  Sedimentation rate  . CK  . Cyclic citrul peptide antibody, IgG  . Hepatitis panel, acute  . VITAMIN D 25 Hydroxy (Vit-D Deficiency, Fractures)   No orders of the defined types were placed in this encounter.   Face-to-face time spent with patient was 45 minutes. Greater than 50% of time was spent in counseling and coordination of care.  Follow-Up Instructions: Return for Polyarthralgia.   Bo Merino, MD  Note - This record has been created using Editor, commissioning.  Chart creation errors have been sought, but may not always  have been located. Such creation errors do not reflect on  the standard of medical care.

## 2017-06-17 ENCOUNTER — Encounter: Payer: Self-pay | Admitting: Rheumatology

## 2017-06-17 ENCOUNTER — Ambulatory Visit (INDEPENDENT_AMBULATORY_CARE_PROVIDER_SITE_OTHER): Payer: BLUE CROSS/BLUE SHIELD

## 2017-06-17 ENCOUNTER — Ambulatory Visit (INDEPENDENT_AMBULATORY_CARE_PROVIDER_SITE_OTHER): Payer: Self-pay

## 2017-06-17 ENCOUNTER — Ambulatory Visit (INDEPENDENT_AMBULATORY_CARE_PROVIDER_SITE_OTHER): Payer: BLUE CROSS/BLUE SHIELD | Admitting: Rheumatology

## 2017-06-17 VITALS — BP 136/73 | HR 78 | Ht 62.0 in | Wt 167.0 lb

## 2017-06-17 DIAGNOSIS — G8929 Other chronic pain: Secondary | ICD-10-CM | POA: Diagnosis not present

## 2017-06-17 DIAGNOSIS — Z8719 Personal history of other diseases of the digestive system: Secondary | ICD-10-CM

## 2017-06-17 DIAGNOSIS — M25562 Pain in left knee: Secondary | ICD-10-CM | POA: Diagnosis not present

## 2017-06-17 DIAGNOSIS — Z862 Personal history of diseases of the blood and blood-forming organs and certain disorders involving the immune mechanism: Secondary | ICD-10-CM

## 2017-06-17 DIAGNOSIS — Z87448 Personal history of other diseases of urinary system: Secondary | ICD-10-CM

## 2017-06-17 DIAGNOSIS — Z9049 Acquired absence of other specified parts of digestive tract: Secondary | ICD-10-CM | POA: Diagnosis not present

## 2017-06-17 DIAGNOSIS — Z9889 Other specified postprocedural states: Secondary | ICD-10-CM

## 2017-06-17 DIAGNOSIS — M533 Sacrococcygeal disorders, not elsewhere classified: Secondary | ICD-10-CM

## 2017-06-17 DIAGNOSIS — M791 Myalgia, unspecified site: Secondary | ICD-10-CM

## 2017-06-17 DIAGNOSIS — M79642 Pain in left hand: Secondary | ICD-10-CM

## 2017-06-17 DIAGNOSIS — Z8639 Personal history of other endocrine, nutritional and metabolic disease: Secondary | ICD-10-CM | POA: Diagnosis not present

## 2017-06-17 DIAGNOSIS — K5180 Other ulcerative colitis without complications: Secondary | ICD-10-CM | POA: Diagnosis not present

## 2017-06-17 DIAGNOSIS — R768 Other specified abnormal immunological findings in serum: Secondary | ICD-10-CM

## 2017-06-17 DIAGNOSIS — M79641 Pain in right hand: Secondary | ICD-10-CM

## 2017-06-17 DIAGNOSIS — M722 Plantar fascial fibromatosis: Secondary | ICD-10-CM | POA: Diagnosis not present

## 2017-06-17 DIAGNOSIS — M25561 Pain in right knee: Secondary | ICD-10-CM

## 2017-06-17 DIAGNOSIS — Z8659 Personal history of other mental and behavioral disorders: Secondary | ICD-10-CM | POA: Diagnosis not present

## 2017-06-17 DIAGNOSIS — Z8679 Personal history of other diseases of the circulatory system: Secondary | ICD-10-CM

## 2017-06-17 DIAGNOSIS — M5030 Other cervical disc degeneration, unspecified cervical region: Secondary | ICD-10-CM | POA: Diagnosis not present

## 2017-06-17 NOTE — Patient Instructions (Signed)
Knee Exercises Ask your health care provider which exercises are safe for you. Do exercises exactly as told by your health care provider and adjust them as directed. It is normal to feel mild stretching, pulling, tightness, or discomfort as you do these exercises, but you should stop right away if you feel sudden pain or your pain gets worse.Do not begin these exercises until told by your health care provider. STRETCHING AND RANGE OF MOTION EXERCISES These exercises warm up your muscles and joints and improve the movement and flexibility of your knee. These exercises also help to relieve pain, numbness, and tingling. Exercise A: Knee Extension, Prone 1. Lie on your abdomen on a bed. 2. Place your left / right knee just beyond the edge of the surface so your knee is not on the bed. You can put a towel under your left / right thigh just above your knee for comfort. 3. Relax your leg muscles and allow gravity to straighten your knee. You should feel a stretch behind your left / right knee. 4. Hold this position for __________ seconds. 5. Scoot up so your knee is supported between repetitions. Repeat __________ times. Complete this stretch __________ times a day. Exercise B: Knee Flexion, Active  1. Lie on your back with both knees straight. If this causes back discomfort, bend your left / right knee so your foot is flat on the floor. 2. Slowly slide your left / right heel back toward your buttocks until you feel a gentle stretch in the front of your knee or thigh. 3. Hold this position for __________ seconds. 4. Slowly slide your left / right heel back to the starting position. Repeat __________ times. Complete this exercise __________ times a day. Exercise C: Quadriceps, Prone  1. Lie on your abdomen on a firm surface, such as a bed or padded floor. 2. Bend your left / right knee and hold your ankle. If you cannot reach your ankle or pant leg, loop a belt around your foot and grab the belt  instead. 3. Gently pull your heel toward your buttocks. Your knee should not slide out to the side. You should feel a stretch in the front of your thigh and knee. 4. Hold this position for __________ seconds. Repeat __________ times. Complete this stretch __________ times a day. Exercise D: Hamstring, Supine 1. Lie on your back. 2. Loop a belt or towel over the ball of your left / right foot. The ball of your foot is on the walking surface, right under your toes. 3. Straighten your left / right knee and slowly pull on the belt to raise your leg until you feel a gentle stretch behind your knee. ? Do not let your left / right knee bend while you do this. ? Keep your other leg flat on the floor. 4. Hold this position for __________ seconds. Repeat __________ times. Complete this stretch __________ times a day. STRENGTHENING EXERCISES These exercises build strength and endurance in your knee. Endurance is the ability to use your muscles for a long time, even after they get tired. Exercise E: Quadriceps, Isometric  1. Lie on your back with your left / right leg extended and your other knee bent. Put a rolled towel or small pillow under your knee if told by your health care provider. 2. Slowly tense the muscles in the front of your left / right thigh. You should see your kneecap slide up toward your hip or see increased dimpling just above the knee. This  motion will push the back of the knee toward the floor. 3. For __________ seconds, keep the muscle as tight as you can without increasing your pain. 4. Relax the muscles slowly and completely. Repeat __________ times. Complete this exercise __________ times a day. Exercise F: Straight Leg Raises - Quadriceps 1. Lie on your back with your left / right leg extended and your other knee bent. 2. Tense the muscles in the front of your left / right thigh. You should see your kneecap slide up or see increased dimpling just above the knee. Your thigh may  even shake a bit. 3. Keep these muscles tight as you raise your leg 4-6 inches (10-15 cm) off the floor. Do not let your knee bend. 4. Hold this position for __________ seconds. 5. Keep these muscles tense as you lower your leg. 6. Relax your muscles slowly and completely after each repetition. Repeat __________ times. Complete this exercise __________ times a day. Exercise G: Hamstring, Isometric 1. Lie on your back on a firm surface. 2. Bend your left / right knee approximately __________ degrees. 3. Dig your left / right heel into the surface as if you are trying to pull it toward your buttocks. Tighten the muscles in the back of your thighs to dig as hard as you can without increasing any pain. 4. Hold this position for __________ seconds. 5. Release the tension gradually and allow your muscles to relax completely for __________ seconds after each repetition. Repeat __________ times. Complete this exercise __________ times a day. Exercise H: Hamstring Curls  If told by your health care provider, do this exercise while wearing ankle weights. Begin with __________ weights. Then increase the weight by 1 lb (0.5 kg) increments. Do not wear ankle weights that are more than __________. 1. Lie on your abdomen with your legs straight. 2. Bend your left / right knee as far as you can without feeling pain. Keep your hips flat against the floor. 3. Hold this position for __________ seconds. 4. Slowly lower your leg to the starting position.  Repeat __________ times. Complete this exercise __________ times a day. Exercise I: Squats (Quadriceps) 1. Stand in front of a table, with your feet and knees pointing straight ahead. You may rest your hands on the table for balance but not for support. 2. Slowly bend your knees and lower your hips like you are going to sit in a chair. ? Keep your weight over your heels, not over your toes. ? Keep your lower legs upright so they are parallel with the table  legs. ? Do not let your hips go lower than your knees. ? Do not bend lower than told by your health care provider. ? If your knee pain increases, do not bend as low. 3. Hold the squat position for __________ seconds. 4. Slowly push with your legs to return to standing. Do not use your hands to pull yourself to standing. Repeat __________ times. Complete this exercise __________ times a day. Exercise J: Wall Slides (Quadriceps)  1. Lean your back against a smooth wall or door while you walk your feet out 18-24 inches (46-61 cm) from it. 2. Place your feet hip-width apart. 3. Slowly slide down the wall or door until your knees bend __________ degrees. Keep your knees over your heels, not over your toes. Keep your knees in line with your hips. 4. Hold for __________ seconds. Repeat __________ times. Complete this exercise __________ times a day. Exercise K: Straight Leg Raises -  Hip Abductors 1. Lie on your side with your left / right leg in the top position. Lie so your head, shoulder, knee, and hip line up. You may bend your bottom knee to help you keep your balance. 2. Roll your hips slightly forward so your hips are stacked directly over each other and your left / right knee is facing forward. 3. Leading with your heel, lift your top leg 4-6 inches (10-15 cm). You should feel the muscles in your outer hip lifting. ? Do not let your foot drift forward. ? Do not let your knee roll toward the ceiling. 4. Hold this position for __________ seconds. 5. Slowly return your leg to the starting position. 6. Let your muscles relax completely after each repetition. Repeat __________ times. Complete this exercise __________ times a day. Exercise L: Straight Leg Raises - Hip Extensors 1. Lie on your abdomen on a firm surface. You can put a pillow under your hips if that is more comfortable. 2. Tense the muscles in your buttocks and lift your left / right leg about 4-6 inches (10-15 cm). Keep your knee  straight as you lift your leg. 3. Hold this position for __________ seconds. 4. Slowly lower your leg to the starting position. 5. Let your leg relax completely after each repetition. Repeat __________ times. Complete this exercise __________ times a day. This information is not intended to replace advice given to you by your health care provider. Make sure you discuss any questions you have with your health care provider. Document Released: 07/02/2005 Document Revised: 05/12/2016 Document Reviewed: 06/24/2015 Elsevier Interactive Patient Education  2018 [place] Exercises Hand exercises can be helpful to almost anyone. These exercises can strengthen the hands, improve flexibility and movement, and increase blood flow to the hands. These results can make work and daily tasks easier. Hand exercises can be especially helpful for people who have joint pain from arthritis or have nerve damage from overuse (carpal tunnel syndrome). These exercises can also help people who have injured a hand. Most of these hand exercises are fairly gentle stretching routines. You can do them often throughout the day. Still, it is a good idea to ask your health care provider which exercises would be best for you. Warming your hands before exercise may help to reduce stiffness. You can do this with gentle massage or by placing your hands in warm water for 15 minutes. Also, make sure you pay attention to your level of hand pain as you begin an exercise routine. Exercises Knuckle Bend Repeat this exercise 5-10 times with each hand. 6. Stand or sit with your arm, hand, and all five fingers pointed straight up. Make sure your wrist is straight. 7. Gently and slowly bend your fingers down and inward until the tips of your fingers are touching the tops of your palm. 8. Hold this position for a few seconds. 9. Extend your fingers out to their original position, all pointing straight up again.  Finger Fan Repeat this  exercise 5-10 times with each hand. 5. Hold your arm and hand out in front of you. Keep your wrist straight. 6. Squeeze your hand into a fist. 7. Hold this position for a few seconds. 8. Edison Simon out, or spread apart, your hand and fingers as much as possible, stretching every joint fully.  Tabletop Repeat this exercise 5-10 times with each hand. 5. Stand or sit with your arm, hand, and all five fingers pointed straight up. Make sure your wrist is straight.  6. Gently and slowly bend your fingers at the knuckles where they meet the hand until your hand is making an upside-down L shape. Your fingers should form a tabletop. 7. Hold this position for a few seconds. 8. Extend your fingers out to their original position, all pointing straight up again.  Making Os Repeat this exercise 5-10 times with each hand. 1. Stand or sit with your arm, hand, and all five fingers pointed straight up. Make sure your wrist is straight. 2. Make an O shape by touching your pointer finger to your thumb. Hold for a few seconds. Then open your hand wide. 3. Repeat this motion with each finger on your hand.  Table Spread Repeat this exercise 5-10 times with each hand. 5. Place your hand on a table with your palm facing down. Make sure your wrist is straight. 6. Spread your fingers out as much as possible. Hold this position for a few seconds. 7. Slide your fingers back together again. Hold for a few seconds.  Ball Grip  Repeat this exercise 10-15 times with each hand. 7. Hold a tennis ball or another soft ball in your hand. 8. While slowly increasing pressure, squeeze the ball as hard as possible. 9. Squeeze as hard as you can for 3-5 seconds. 10. Relax and repeat.  Wrist Curls Repeat this exercise 10-15 times with each hand. 6. Sit in a chair that has armrests. 7. Hold a light weight in your hand, such as a dumbbell that weighs 1-3 pounds (0.5-1.4 kg). Ask your health care provider what weight would be best for  you. 8. Rest your hand just over the end of the chair arm with your palm facing up. 9. Gently pivot your wrist up and down while holding the weight. Do not twist your wrist from side to side.  Contact a health care provider if:  Your hand pain or discomfort gets much worse when you do an exercise.  Your hand pain or discomfort does not improve within 2 hours after you exercise. If you have any of these problems, stop doing these exercises right away. Do not do them again unless your health care provider says that you can. Get help right away if:  You develop sudden, severe hand pain. If this happens, stop doing these exercises right away. Do not do them again unless your health care provider says that you can. This information is not intended to replace advice given to you by your health care provider. Make sure you discuss any questions you have with your health care provider. Document Released: 07/30/2015 Document Revised: 01/24/2016 Document Reviewed: 02/26/2015 Elsevier Interactive Patient Education  [employer].

## 2017-06-18 ENCOUNTER — Telehealth: Payer: Self-pay

## 2017-06-18 DIAGNOSIS — E559 Vitamin D deficiency, unspecified: Secondary | ICD-10-CM

## 2017-06-18 LAB — HEPATITIS PANEL, ACUTE
HEP B C IGM: NONREACTIVE
HEP B S AG: NONREACTIVE
HEP C AB: NONREACTIVE
Hep A IgM: NONREACTIVE
SIGNAL TO CUT-OFF: 0.02 (ref <1.00)

## 2017-06-18 LAB — VITAMIN D 25 HYDROXY (VIT D DEFICIENCY, FRACTURES): VIT D 25 HYDROXY: 21 ng/mL — AB (ref 30–100)

## 2017-06-18 LAB — CK: CK TOTAL: 70 U/L (ref 29–143)

## 2017-06-18 LAB — CYCLIC CITRUL PEPTIDE ANTIBODY, IGG: Cyclic Citrullin Peptide Ab: <16 UNITS

## 2017-06-18 LAB — SEDIMENTATION RATE: Sed Rate: 28 mm/h (ref 0–30)

## 2017-06-18 MED ORDER — VITAMIN D (ERGOCALCIFEROL) 1.25 MG (50000 UNIT) PO CAPS: 50000.0000 [IU] | ORAL_CAPSULE | ORAL | 0 refills | Status: DC

## 2017-06-18 NOTE — Progress Notes (Signed)
We will discuss labs at follow-up visit.

## 2017-06-18 NOTE — Addendum Note (Signed)
Addended by: Carole Binning on: 06/18/2017 12:13 PM   Modules accepted: Orders

## 2017-06-18 NOTE — Telephone Encounter (Signed)
Patient notified of labs results and prescription has been sent to the pharmacy.

## 2017-06-18 NOTE — Telephone Encounter (Signed)
Patient was returning Andrea's call.  Cb# is [phone].  Thank You.

## 2017-07-10 DIAGNOSIS — M170 Bilateral primary osteoarthritis of knee: Secondary | ICD-10-CM | POA: Insufficient documentation

## 2017-07-10 DIAGNOSIS — M19041 Primary osteoarthritis, right hand: Secondary | ICD-10-CM | POA: Insufficient documentation

## 2017-07-10 DIAGNOSIS — M722 Plantar fascial fibromatosis: Secondary | ICD-10-CM | POA: Insufficient documentation

## 2017-07-10 DIAGNOSIS — M5030 Other cervical disc degeneration, unspecified cervical region: Secondary | ICD-10-CM | POA: Insufficient documentation

## 2017-07-10 DIAGNOSIS — E559 Vitamin D deficiency, unspecified: Secondary | ICD-10-CM | POA: Insufficient documentation

## 2017-07-10 DIAGNOSIS — M19042 Primary osteoarthritis, left hand: Secondary | ICD-10-CM

## 2017-07-10 DIAGNOSIS — R899 Unspecified abnormal finding in specimens from other organs, systems and tissues: Secondary | ICD-10-CM | POA: Insufficient documentation

## 2017-07-10 NOTE — Progress Notes (Deleted)
Office Visit Note  Patient: Rhonda Gutierrez             Date of Birth: February 27, 1961           MRN: [id number]             PCP: Hulan Fess, MD Referring: Hulan Fess, MD Visit Date: 07/16/2017 Occupation: _0 @    Subjective:  No chief complaint on file.   History of Present Illness: Rhonda Gutierrez is a 56 y.o. female ***   Activities of Daily Living:  Patient reports morning stiffness for *** {minute/hour:19697}.   Patient {ACTIONS;DENIES/REPORTS:21021675::"Denies"} nocturnal pain.  Difficulty dressing/grooming: {ACTIONS;DENIES/REPORTS:21021675::"Denies"} Difficulty climbing stairs: {ACTIONS;DENIES/REPORTS:21021675::"Denies"} Difficulty getting out of chair: {ACTIONS;DENIES/REPORTS:21021675::"Denies"} Difficulty using hands for taps, buttons, cutlery, and/or writing: {ACTIONS;DENIES/REPORTS:21021675::"Denies"}   No Rheumatology ROS completed.   PMFS History:  Patient Active Problem List   Diagnosis Date Noted  . Primary osteoarthritis of both hands 07/10/2017  . Primary osteoarthritis of both knees 07/10/2017  . Plantar fasciitis 07/10/2017  . DDD (degenerative disc disease), cervical 07/10/2017  . Vitamin D deficiency 07/10/2017  . Abnormal laboratory test 07/10/2017  . Gastroenteritis presumed infectious 08/12/2015  . Nausea vomiting and diarrhea 07/30/2015  . ARF (acute renal failure) ([place]) 07/30/2015  . Diarrhea 07/30/2015  . Acute kidney injury ([place]) 07/29/2015  . Pain in joint, ankle and foot 05/03/2013  . Type I (juvenile type) diabetes mellitus with renal manifestations, not stated as uncontrolled(250.41) 01/11/2013  . Carpal tunnel syndrome, bilateral 10/07/2011  . Cough 07/09/2010  . PROTEINURIA, MILD 04/10/2009  . DISTURBANCE OF SKIN SENSATION 01/02/2009  . HYPERLIPIDEMIA 11/21/2008  . RENAL INSUFFICIENCY 11/21/2008  . Ulcerative colitis ([place]) 03/06/2008  . FATTY LIVER DISEASE 03/06/2008  . Essential hypertension 04/14/2007    Past Medical  History:  Diagnosis Date  . Dyslipidemia   . Fatty liver disease, nonalcoholic   . Type I (juvenile type) diabetes mellitus without mention of complication, not stated as uncontrolled   . Ulcerative colitis, unspecified   . Unspecified essential hypertension     Family History  Problem Relation Age of Onset  . Cancer Father    Past Surgical History:  Procedure Laterality Date  . APPENDECTOMY    . ILEOSTOMY  06/17/2006   take down  . Insulin pump    . NASAL SINUS SURGERY  02/10/1994  . 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 She believes that when she returns to work with much more activity, her glucoses will come down some more.     Objective: Vital Signs: There were no vitals taken for this visit.   Physical Exam   Musculoskeletal Exam: ***  CDAI Exam: No CDAI exam completed.    Investigation: No additional findings. CBC Latest Ref Rng & Units 07/31/2015 07/30/2015 07/29/2015  WBC 4.0 - 10.5 K/uL 4.7 5.1 8.7  Hemoglobin 12.0 - 15.0 g/dL 11.6(L) 13.1 14.7  Hematocrit 36.0 - 46.0 % 37.0 39.6 45.1  Platelets 150 - 400 K/uL 215 229 343   CMP     Component Value Date/Time   NA 140 07/31/2015 0409   K 3.8 07/31/2015 0409   CL 107 07/31/2015 0409   CO2 26 07/31/2015 0409   GLUCOSE 95 07/31/2015 0409   BUN 11 07/31/2015 0409   CREATININE 0.70 07/31/2015 0409   CALCIUM 8.6 (L) 07/31/2015 0409  CALCIUM 9.7 04/08/2011 1053   PROT 7.3 07/30/2015 0448   ALBUMIN 3.5 07/30/2015 0448   AST 26 07/30/2015 0448   ALT 20 07/30/2015 0448   ALKPHOS 75 07/30/2015 0448   BILITOT 1.1 07/30/2015 0448   GFRNONAA >60 07/31/2015 0409   GFRAA >60 07/31/2015 0409   0/17/2018 hepatitis panel negative ESR 28, CK 70, anti-CCP negative, vitamin D 21 Imaging: Xr Hand 2 View Left  Result Date: 06/17/2017 PIP/DIP narrowing was noted. No significant  narrowing was noted in the fifth finger DIP joint. No MCP or intercarpal joint space narrowing or erosive changes were noted. Impression: These findings are consistent with osteoarthritis of the hand.  Xr Hand 2 View Right  Result Date: 06/17/2017 PIP/DIP narrowing was noted. No MCP or intercarpal joint space narrowing was noted. No erosive changes were noted. Impression: These findings are consistent with early osteoarthritis.  Xr Knee 3 View Left  Result Date: 06/17/2017 Mild medial compartment narrowing was noted. Mild patellofemoral narrowing was noted. No chondrocalcinosis was noted. 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  Xr Knee 3 View Right  Result Date: 06/17/2017 Mild medial compartment narrowing was noted. Mild patellofemoral narrowing was noted. No chondrocalcinosis was noted. 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  Xr Pelvis 1-2 Views  Result Date: 06/17/2017 No SI joint narrowing and sclerosis was noted. She is some inferior spurring.   Speciality Comments: No specialty comments available.    Procedures:  No procedures performed Allergies: Aspartame; Codeine; Mesalamine; Clarithromycin; and Sulfonamide derivatives   Assessment / Plan:     Visit Diagnoses: Abnormal laboratory test - RF positive, anti-CCP negative  Primary osteoarthritis of both hands - Mild  Primary osteoarthritis of both knees - Bilateral mild with mild chondromalacia patella  Plantar fasciitis - History of plantar fasciitis treated with orthotics and gabapentin.  DDD (degenerative disc disease), cervical  Vitamin D deficiency  Other ulcerative colitis without complication ([place]) -  Status post ileostomy and proctectomy 2007. Patient states that she has post colectomy diarrhea and does have to take Imodium several times a day.   Her other medical problems are listed as follows:  History of colectomy  History  of fatty infiltration of liver  History of diabetes mellitus - type1  History of chronic kidney disease/ Dr Brayton El manages   History of anxiety  History of hyperlipidemia  History of hypertension  History of gastroesophageal reflux (GERD) - Followed up by Dr[facility] R[facility]  History of anemia - Pernicious   Orders: No orders of the defined types were placed in this encounter.  No orders of the defined types were placed in this encounter.   Face-to-face time spent with patient was *** minutes. 50% of time was spent in counseling and coordination of care.  Follow-Up Instructions: No Follow-up on file.   Bo Merino, MD  Note - This record has been created using Editor, commissioning.  Chart creation errors have been sought, but may not always  have been located. Such creation errors do not reflect on  the standard of medical care.

## 2017-07-16 ENCOUNTER — Ambulatory Visit: Payer: Self-pay | Admitting: Rheumatology

## 2017-07-17 ENCOUNTER — Ambulatory Visit: Payer: Self-pay | Admitting: Rheumatology

## 2017-07-31 NOTE — Progress Notes (Signed)
Office Visit Note  Patient: Rhonda Gutierrez             Date of Birth: 25-Nov-1960           MRN: [id number]             PCP: Hulan Fess, MD Referring: Hulan Fess, MD Visit Date: 08/05/2017 Occupation: @GUAROCC @    Subjective:  Pain in hands and knees   History of Present Illness: Cache Decoursey is a 56 y.o. female with history of polyarthralgias. She states she continues to have pain and discomfort in her bilateral hands and bilateral knee joints. She has noticed occasional swelling in her hands. She's been going to physical therapy for her C-spine which is been helpful. She has had no recurrence of plantar fasciitis since she's been on gabapentin. She continues to have diarrhea which is due to post ileostomy.  Activities of Daily Living:  Patient reports morning stiffness for 30  minutes.   Patient Reports nocturnal pain. Knee joints Difficulty dressing/grooming: Denies Difficulty climbing stairs: Reports Difficulty getting out of chair: Denies Difficulty using hands for taps, buttons, cutlery, and/or writing: Denies   Review of Systems  Constitutional: Positive for fatigue. Negative for night sweats, weight gain, weight loss and weakness.  HENT: Negative for mouth sores, trouble swallowing, trouble swallowing, mouth dryness and nose dryness.   Eyes: Negative for pain, redness, visual disturbance and dryness.  Respiratory: Negative for cough, shortness of breath and difficulty breathing.   Cardiovascular: Negative for chest pain, palpitations, hypertension, irregular heartbeat and swelling in legs/feet.  Gastrointestinal: Positive for diarrhea. Negative for blood in stool and constipation.  Endocrine: Negative for increased urination.  Genitourinary: Negative for vaginal dryness.  Musculoskeletal: Positive for arthralgias, joint pain, joint swelling and morning stiffness. Negative for myalgias, muscle weakness, muscle tenderness and myalgias.  Skin: Negative for color  change, rash, hair loss, skin tightness, ulcers and sensitivity to sunlight.  Allergic/Immunologic: Negative for susceptible to infections.  Neurological: Negative for dizziness, memory loss and night sweats.  Hematological: Negative for swollen glands.  Psychiatric/Behavioral: Positive for depressed mood and sleep disturbance. The patient is nervous/anxious.     PMFS History:  Patient Active Problem List   Diagnosis Date Noted  . Primary osteoarthritis of both hands 07/10/2017  . Primary osteoarthritis of both knees 07/10/2017  . Plantar fasciitis 07/10/2017  . DDD (degenerative disc disease), cervical 07/10/2017  . Vitamin D deficiency 07/10/2017  . Abnormal laboratory test 07/10/2017  . Gastroenteritis presumed infectious 08/12/2015  . Nausea vomiting and diarrhea 07/30/2015  . ARF (acute renal failure) ([place]) 07/30/2015  . Diarrhea 07/30/2015  . Acute kidney injury ([place]) 07/29/2015  . Pain in joint, ankle and foot 05/03/2013  . Type I (juvenile type) diabetes mellitus with renal manifestations, not stated as uncontrolled(250.41) 01/11/2013  . Carpal tunnel syndrome, bilateral 10/07/2011  . Cough 07/09/2010  . PROTEINURIA, MILD 04/10/2009  . DISTURBANCE OF SKIN SENSATION 01/02/2009  . HYPERLIPIDEMIA 11/21/2008  . RENAL INSUFFICIENCY 11/21/2008  . Ulcerative colitis ([place]) 03/06/2008  . FATTY LIVER DISEASE 03/06/2008  . Essential hypertension 04/14/2007    Past Medical History:  Diagnosis Date  . Dyslipidemia   . Fatty liver disease, nonalcoholic   . Type I (juvenile type) diabetes mellitus without mention of complication, not stated as uncontrolled   . Ulcerative colitis, unspecified   . Unspecified essential hypertension     Family History  Problem Relation Age of Onset  . Cancer Father   . Allergy (severe) Daughter  Past Surgical History:  Procedure Laterality Date  . APPENDECTOMY    . CARPAL TUNNEL RELEASE    . CHOLECYSTECTOMY  1993  . ILEOSTOMY  06/17/2006    take down  . Insulin pump    . mass removal      right arm, benign  . NASAL SINUS SURGERY  02/10/1994  . PROTOCOLECTOMY  12/08/2005   total  . stoma  2007   reversal of ileostomy  . 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 She believes that when she returns to work with much more activity, her glucoses will come down some more.     Objective: Vital Signs: BP 122/60 (BP Location: Left Arm, Patient Position: Sitting, Cuff Size: Normal)   Pulse 87   Resp 16   Ht _0  (1.575 m)   Wt 165 lb (74.8 kg)   BMI 30.18 kg/m    Physical Exam  Constitutional: She is oriented to person, place, and time. She appears well-developed and well-nourished.  HENT:  Head: Normocephalic and atraumatic.  Eyes: Conjunctivae and EOM are normal.  Neck: Normal range of motion.  Cardiovascular: Normal rate, regular rhythm, normal heart sounds and intact distal pulses.  Pulmonary/Chest: Effort normal and breath sounds normal.  Abdominal: Soft. Bowel sounds are normal.  Lymphadenopathy:    She has no cervical adenopathy.  Neurological: She is alert and oriented to person, place, and time.  Skin: Skin is warm and dry. Capillary refill takes less than 2 seconds.  Psychiatric: She has a normal mood and affect. Her behavior is normal.  Nursing note and vitals reviewed.    Musculoskeletal Exam: C-spine some discomfort with range of motion. She had bilateral trapezius is spasm. She has mild thoracic kyphosis. Lumbar spine good range of motion. Shoulder joints elbow joints wrist joints are good range of motion. She has DIP PIP thickening with no synovitis. No tenderness over MCPs or wrists joints was noted. Hip joints are good range of motion. She is some tenderness over trochanteric area. She had tenderness over medial aspect of her knee joints. No warmth swelling or effusion was  noted. CDAI Exam: No CDAI exam completed.    Investigation: No additional findings. 06/17/2017 hepatitis panel negative, ESR 28, CK 70, anti-CCP less than 16, vitamin D 21  Imaging: [place] Extrem Up Bilat Comp  Result Date: 08/05/2017 Ultrasound examination of bilateral hands was performed per EULAR recommendations. Using 12 MHz transducer, grayscale and power Doppler bilateral second, third, and fifth MCP joints and bilateral wrist joints both dorsal and volar aspects were evaluated to look for synovitis or tenosynovitis. The findings were there was no synovitis or tenosynovitis on ultrasound examination. Right median nerve was 0.0 8 cm squares which was within normal limits and left median nerve was 0.12 cm squares which was at the upper limits of normal. Impression: Ultrasound examination did not show any evidence of synovitis or tenosynovitis.   Speciality Comments: No specialty comments available.    Procedures:  No procedures performed Allergies: Aspartame; Codeine; Mesalamine; Rofecoxib; Clarithromycin; Sulfa antibiotics; and Sulfonamide derivatives   Assessment / Plan:     Visit Diagnoses: Polyarthralgia - Positive RF, negative anti-CCP, ultrasound bilateral hands was performed today. She has no clinical synovitis. She continues to have arthralgias in multiple joints especially her hands and knee joints. At this point I do not believe  patient has inflammatory arthritis. If she develops new symptoms she supposed to notify us.  Pain in both hands - Plan: [place] Extrem Up Bilat Comp, did not show any synovitis or tenosynovitis on examination.  S/p bilateral carpal tunnel release: Doing well  Primary osteoarthritis of both knees - Mild OA, mild chondromalacia patella. She has significant pain in her bilateral knee joints and nocturnal pain. I believes it could be due to underlying myofascial pain syndrome. Diclofenac gel was given which could be used topically. Side effects were reviewed.  Natural anti-inflammatories were discussed. A handout on knees joint muscle strength and exercise was given.  Primary osteoarthritis of both hands: Joint protection and muscle strengthening discussed.  DDD (degenerative disc disease), cervical: She is going for physical therapy which is been useful. She also had normal trapezius is spasm.  Plantar fasciitis - History of plantar fasciitis in the past. About 2 episodes per year. She has been seeing a podiatrist. She's been using orthotics and gabapentin which is been helpful.  Myofascial pain: She does have some generalized pain and discomfort.  Vitamin D deficiency: She is on supplement.  Other ulcerative colitis without complication ([place]) - Status post ileostomy and proctectomy 2007.  She has chronic diarrhea because of that.   Her other medical problems are listed as follows:  History of colectomy  History of fatty infiltration of liver  History of diabetes mellitus - type1  History of chronic kidney disease/ Dr Brayton El manages   History of anxiety  History of hyperlipidemia  History of hypertension  History of gastroesophageal reflux (GERD) - Followed up by Dr. Renee Harder at [facility]  History of anemia - Pernicious    Orders: Orders Placed This Encounter  Procedures  . [place] Extrem Up Bilat Comp   Meds ordered this encounter  Medications  . diclofenac sodium (VOLTAREN) 1 % GEL    Sig: Apply 2 g topically 4 (four) times daily.    Dispense:  3 Tube    Refill:  1    Face-to-face time spent with patient was 30 minutes. Greater than 50% of time was spent in counseling and coordination of care.  Follow-Up Instructions: Return in about 6 months (around 02/03/2018).   Bo Merino, MD  Note - This record has been created using Editor, commissioning.  Chart creation errors have been sought, but may not always  have been located. Such creation errors do not reflect on  the standard of medical care.

## 2017-08-05 ENCOUNTER — Encounter: Payer: Self-pay | Admitting: Rheumatology

## 2017-08-05 ENCOUNTER — Ambulatory Visit: Payer: BLUE CROSS/BLUE SHIELD | Admitting: Rheumatology

## 2017-08-05 ENCOUNTER — Inpatient Hospital Stay (INDEPENDENT_AMBULATORY_CARE_PROVIDER_SITE_OTHER): Payer: Self-pay

## 2017-08-05 VITALS — BP 122/60 | HR 87 | Resp 16 | Ht 62.0 in | Wt 165.0 lb

## 2017-08-05 DIAGNOSIS — Z9889 Other specified postprocedural states: Secondary | ICD-10-CM

## 2017-08-05 DIAGNOSIS — M722 Plantar fascial fibromatosis: Secondary | ICD-10-CM | POA: Diagnosis not present

## 2017-08-05 DIAGNOSIS — K5180 Other ulcerative colitis without complications: Secondary | ICD-10-CM | POA: Diagnosis not present

## 2017-08-05 DIAGNOSIS — M19042 Primary osteoarthritis, left hand: Secondary | ICD-10-CM

## 2017-08-05 DIAGNOSIS — E559 Vitamin D deficiency, unspecified: Secondary | ICD-10-CM

## 2017-08-05 DIAGNOSIS — M19041 Primary osteoarthritis, right hand: Secondary | ICD-10-CM

## 2017-08-05 DIAGNOSIS — M5030 Other cervical disc degeneration, unspecified cervical region: Secondary | ICD-10-CM | POA: Diagnosis not present

## 2017-08-05 DIAGNOSIS — M2550 Pain in unspecified joint: Secondary | ICD-10-CM

## 2017-08-05 DIAGNOSIS — M7918 Myalgia, other site: Secondary | ICD-10-CM | POA: Diagnosis not present

## 2017-08-05 DIAGNOSIS — M170 Bilateral primary osteoarthritis of knee: Secondary | ICD-10-CM | POA: Diagnosis not present

## 2017-08-05 DIAGNOSIS — M79641 Pain in right hand: Secondary | ICD-10-CM

## 2017-08-05 DIAGNOSIS — M79642 Pain in left hand: Secondary | ICD-10-CM | POA: Diagnosis not present

## 2017-08-05 MED ORDER — DICLOFENAC SODIUM 1 % TD GEL: 2.0000 g | Freq: Four times a day (QID) | TRANSDERMAL | 1 refills | Status: DC

## 2017-08-05 NOTE — Patient Instructions (Addendum)
Natural anti-inflammatories  You can purchase these at [employer], [employer] or online.  . Turmeric (capsules)  . Ginger (ginger root or capsules)  . Omega 3 (Fish, flax seeds, chia seeds, walnuts, almonds)  . Tart cherry (dried or extract)   Patient should be under the care of a physician while taking these supplements. This may not be reproduced without the permission of Dr. Bo Merino.   Knee Exercises Ask your health care provider which exercises are safe for you. Do exercises exactly as told by your health care provider and adjust them as directed. It is normal to feel mild stretching, pulling, tightness, or discomfort as you do these exercises, but you should stop right away if you feel sudden pain or your pain gets worse.Do not begin these exercises until told by your health care provider. STRETCHING AND RANGE OF MOTION EXERCISES These exercises warm up your muscles and joints and improve the movement and flexibility of your knee. These exercises also help to relieve pain, numbness, and tingling. Exercise A: Knee Extension, Prone 1. Lie on your abdomen on a bed. 2. Place your left / right knee just beyond the edge of the surface so your knee is not on the bed. You can put a towel under your left / right thigh just above your knee for comfort. 3. Relax your leg muscles and allow gravity to straighten your knee. You should feel a stretch behind your left / right knee. 4. Hold this position for __________ seconds. 5. Scoot up so your knee is supported between repetitions. Repeat __________ times. Complete this stretch __________ times a day. Exercise B: Knee Flexion, Active  1. Lie on your back with both knees straight. If this causes back discomfort, bend your left / right knee so your foot is flat on the floor. 2. Slowly slide your left / right heel back toward your buttocks until you feel a gentle stretch in the front of your knee or thigh. 3. Hold this position  for __________ seconds. 4. Slowly slide your left / right heel back to the starting position. Repeat __________ times. Complete this exercise __________ times a day. Exercise C: Quadriceps, Prone  1. Lie on your abdomen on a firm surface, such as a bed or padded floor. 2. Bend your left / right knee and hold your ankle. If you cannot reach your ankle or pant leg, loop a belt around your foot and grab the belt instead. 3. Gently pull your heel toward your buttocks. Your knee should not slide out to the side. You should feel a stretch in the front of your thigh and knee. 4. Hold this position for __________ seconds. Repeat __________ times. Complete this stretch __________ times a day. Exercise D: Hamstring, Supine 1. Lie on your back. 2. Loop a belt or towel over the ball of your left / right foot. The ball of your foot is on the walking surface, right under your toes. 3. Straighten your left / right knee and slowly pull on the belt to raise your leg until you feel a gentle stretch behind your knee. ? Do not let your left / right knee bend while you do this. ? Keep your other leg flat on the floor. 4. Hold this position for __________ seconds. Repeat __________ times. Complete this stretch __________ times a day. STRENGTHENING EXERCISES These exercises build strength and endurance in your knee. Endurance is the ability to use your muscles for a long time, even after they get tired. Exercise  E: Quadriceps, Isometric  1. Lie on your back with your left / right leg extended and your other knee bent. Put a rolled towel or small pillow under your knee if told by your health care provider. 2. Slowly tense the muscles in the front of your left / right thigh. You should see your kneecap slide up toward your hip or see increased dimpling just above the knee. This motion will push the back of the knee toward the floor. 3. For __________ seconds, keep the muscle as tight as you can without increasing  your pain. 4. Relax the muscles slowly and completely. Repeat __________ times. Complete this exercise __________ times a day. Exercise F: Straight Leg Raises - Quadriceps 1. Lie on your back with your left / right leg extended and your other knee bent. 2. Tense the muscles in the front of your left / right thigh. You should see your kneecap slide up or see increased dimpling just above the knee. Your thigh may even shake a bit. 3. Keep these muscles tight as you raise your leg 4-6 inches (10-15 cm) off the floor. Do not let your knee bend. 4. Hold this position for __________ seconds. 5. Keep these muscles tense as you lower your leg. 6. Relax your muscles slowly and completely after each repetition. Repeat __________ times. Complete this exercise __________ times a day. Exercise G: Hamstring, Isometric 1. Lie on your back on a firm surface. 2. Bend your left / right knee approximately __________ degrees. 3. Dig your left / right heel into the surface as if you are trying to pull it toward your buttocks. Tighten the muscles in the back of your thighs to dig as hard as you can without increasing any pain. 4. Hold this position for __________ seconds. 5. Release the tension gradually and allow your muscles to relax completely for __________ seconds after each repetition. Repeat __________ times. Complete this exercise __________ times a day. Exercise H: Hamstring Curls  If told by your health care provider, do this exercise while wearing ankle weights. Begin with __________ weights. Then increase the weight by 1 lb (0.5 kg) increments. Do not wear ankle weights that are more than __________. 1. Lie on your abdomen with your legs straight. 2. Bend your left / right knee as far as you can without feeling pain. Keep your hips flat against the floor. 3. Hold this position for __________ seconds. 4. Slowly lower your leg to the starting position.  Repeat __________ times. Complete this exercise  __________ times a day. Exercise I: Squats (Quadriceps) 1. Stand in front of a table, with your feet and knees pointing straight ahead. You may rest your hands on the table for balance but not for support. 2. Slowly bend your knees and lower your hips like you are going to sit in a chair. ? Keep your weight over your heels, not over your toes. ? Keep your lower legs upright so they are parallel with the table legs. ? Do not let your hips go lower than your knees. ? Do not bend lower than told by your health care provider. ? If your knee pain increases, do not bend as low. 3. Hold the squat position for __________ seconds. 4. Slowly push with your legs to return to standing. Do not use your hands to pull yourself to standing. Repeat __________ times. Complete this exercise __________ times a day. Exercise J: Wall Slides (Quadriceps)  1. Lean your back against a smooth wall or door  while you walk your feet out 18-24 inches (46-61 cm) from it. 2. Place your feet hip-width apart. 3. Slowly slide down the wall or door until your knees bend __________ degrees. Keep your knees over your heels, not over your toes. Keep your knees in line with your hips. 4. Hold for __________ seconds. Repeat __________ times. Complete this exercise __________ times a day. Exercise K: Straight Leg Raises - Hip Abductors 1. Lie on your side with your left / right leg in the top position. Lie so your head, shoulder, knee, and hip line up. You may bend your bottom knee to help you keep your balance. 2. Roll your hips slightly forward so your hips are stacked directly over each other and your left / right knee is facing forward. 3. Leading with your heel, lift your top leg 4-6 inches (10-15 cm). You should feel the muscles in your outer hip lifting. ? Do not let your foot drift forward. ? Do not let your knee roll toward the ceiling. 4. Hold this position for __________ seconds. 5. Slowly return your leg to the starting  position. 6. Let your muscles relax completely after each repetition. Repeat __________ times. Complete this exercise __________ times a day. Exercise L: Straight Leg Raises - Hip Extensors 1. Lie on your abdomen on a firm surface. You can put a pillow under your hips if that is more comfortable. 2. Tense the muscles in your buttocks and lift your left / right leg about 4-6 inches (10-15 cm). Keep your knee straight as you lift your leg. 3. Hold this position for __________ seconds. 4. Slowly lower your leg to the starting position. 5. Let your leg relax completely after each repetition. Repeat __________ times. Complete this exercise __________ times a day. This information is not intended to replace advice given to you by your health care provider. Make sure you discuss any questions you have with your health care provider. Document Released: 07/02/2005 Document Revised: 05/12/2016 Document Reviewed: 06/24/2015 Elsevier Interactive Patient Education  2018 [employer].

## 2017-08-31 IMAGING — CR DG HAND 2V*L*
2 series · 2 of 2 positions shown · non-contrast
Comparison: None in PACs

CLINICAL DATA: Chronic pains in both hands with no known injury.

EXAM:
LEFT HAND - 2 VIEW; RIGHT HAND - 2 VIEW

[x hand pa left]
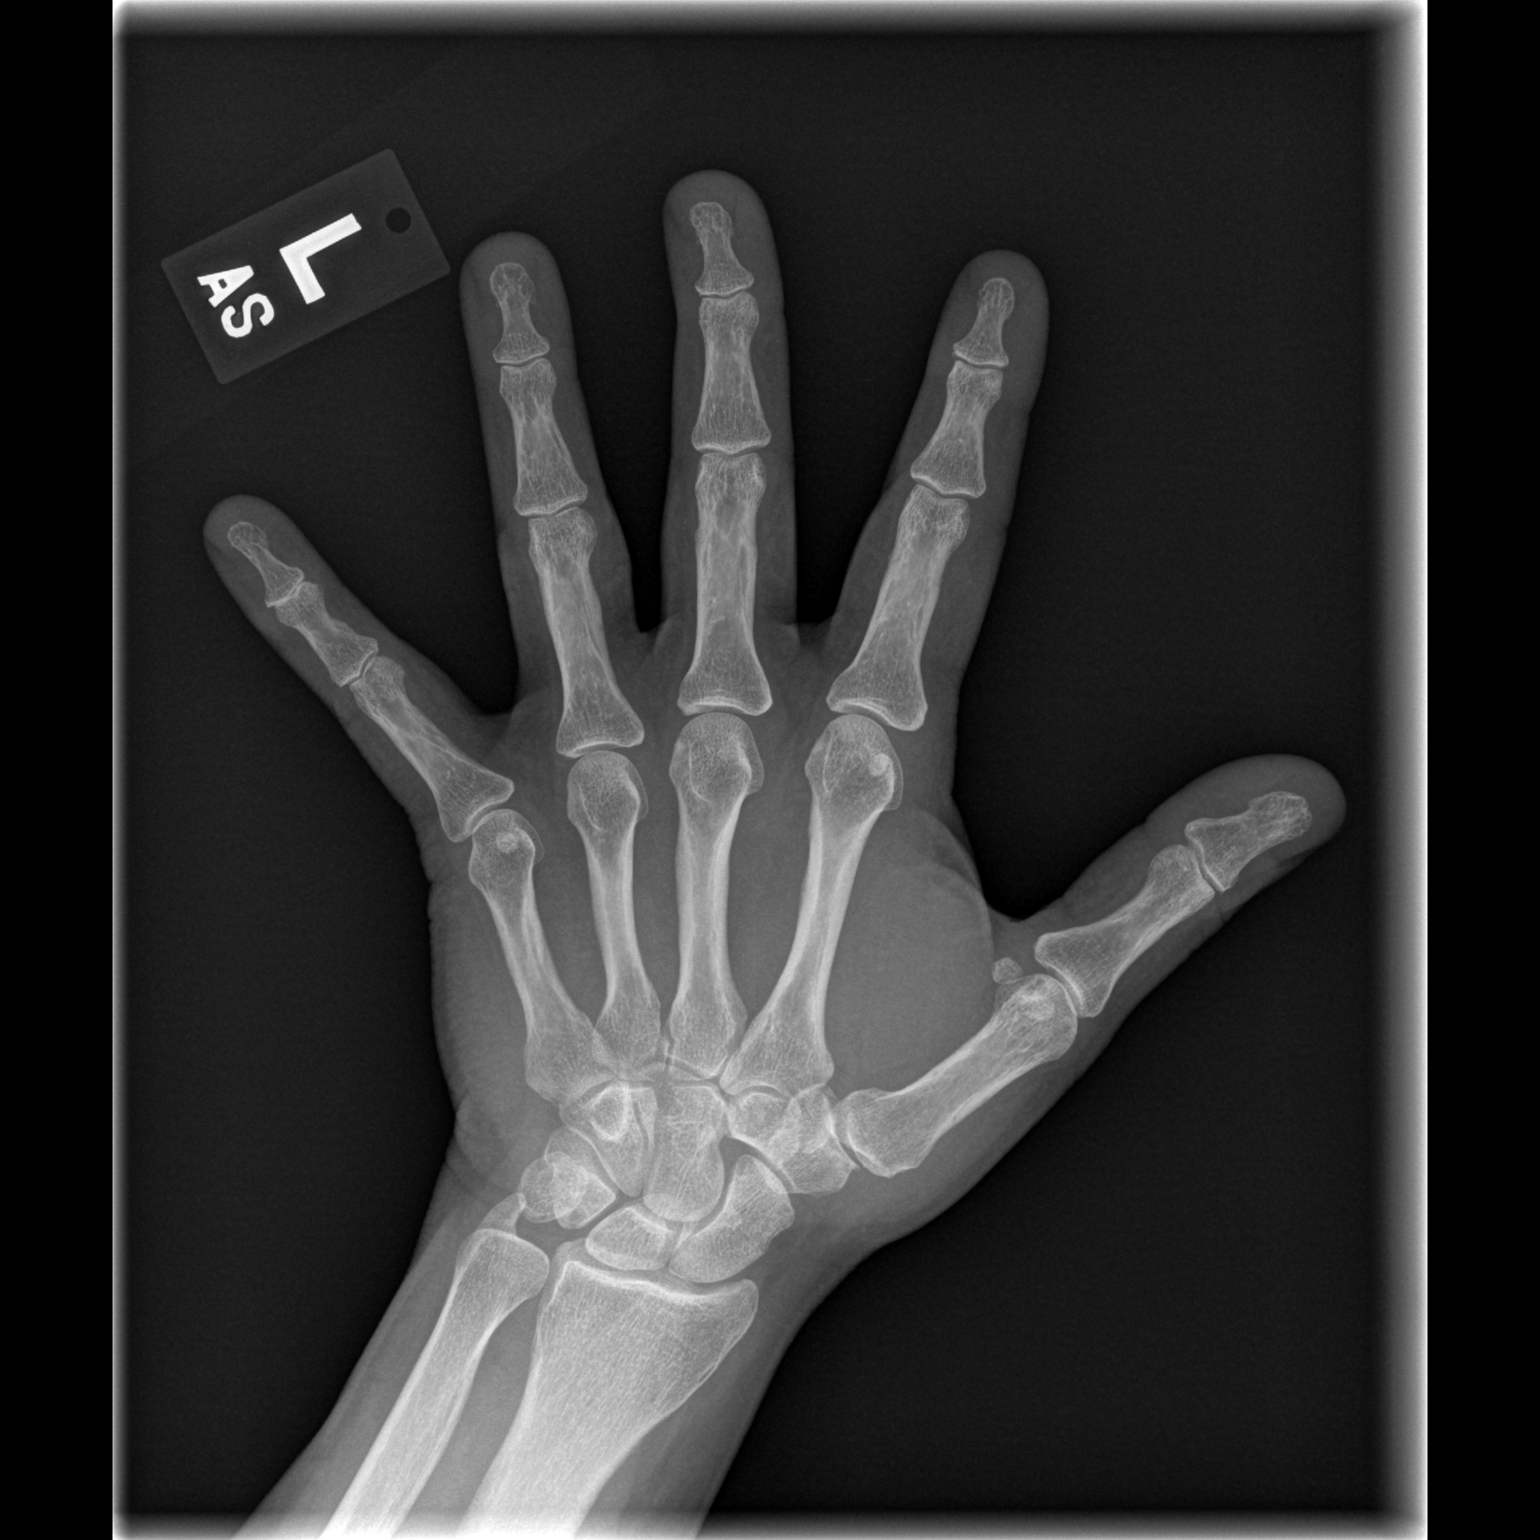

[x hand lat left]
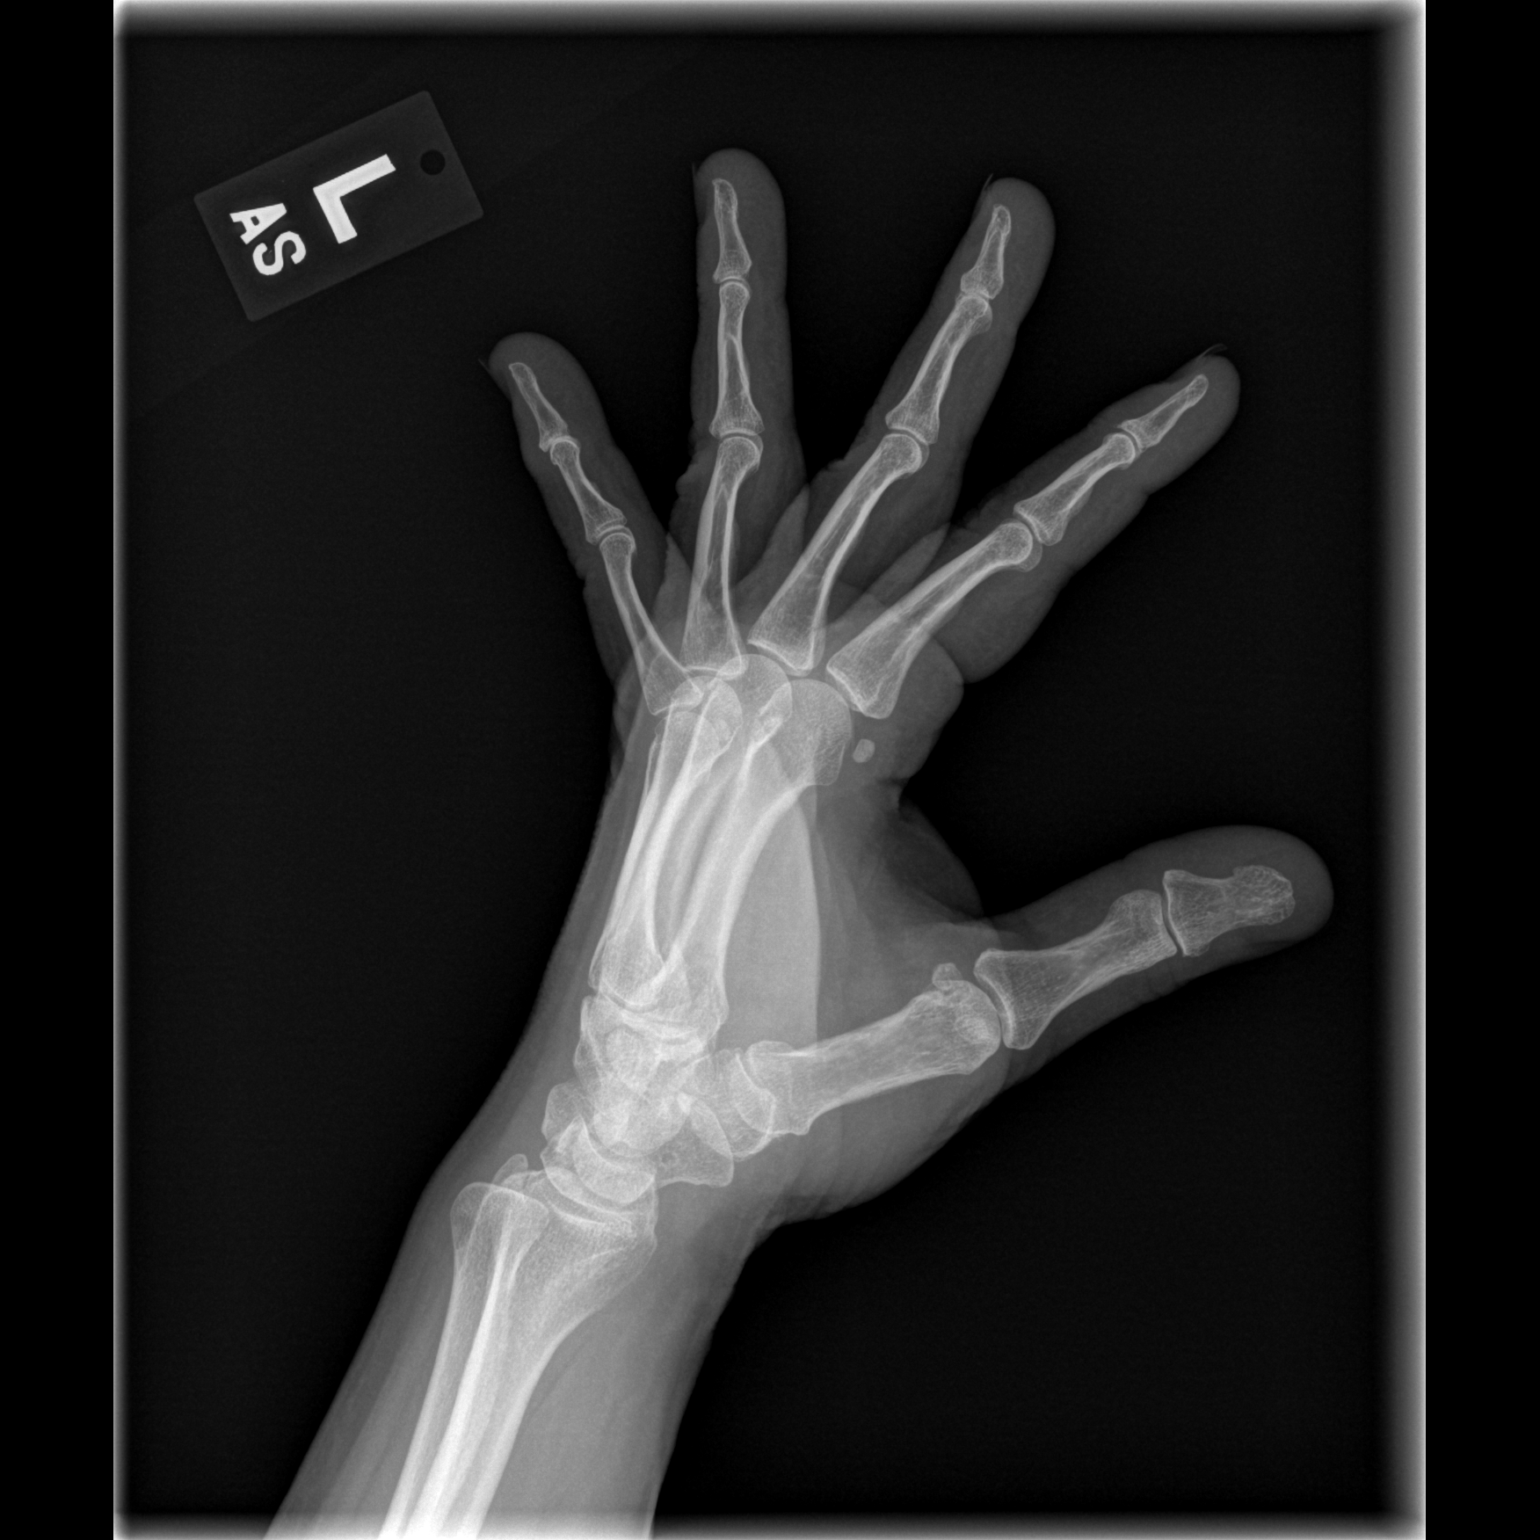

[2 of 2 positions shown; findings below may reference images not displayed]

FINDINGS: The bones of both hands are subjectively adequately mineralized.
There is mild narrowing of the DIP joint spaces of the third through
fifth digits on the right and the fifth digit on the left. There is
mild narrowing of the PIP joint spaces of the fourth and fifth
digits on the right and on the left. The MCP joint spaces are mildly
narrowed diffusely, bilaterally. There is no lytic or blastic bony
lesion. No acute or healing fracture is observed. The observed
portions of the distal radius and ulna and the carpal bones are
normal.
IMPRESSION: There is mild osteoarthritic joint space loss of the interphalangeal
and MCP joints bilaterally. There is no acute bony abnormality.

## 2017-09-18 ENCOUNTER — Ambulatory Visit (HOSPITAL_BASED_OUTPATIENT_CLINIC_OR_DEPARTMENT_OTHER)
Admission: RE | Admit: 2017-09-18 | Discharge: 2017-09-18 | Disposition: A | Discharge status: Home / Self Care | Payer: BLUE CROSS/BLUE SHIELD | Source: Ambulatory Visit | Attending: Family Medicine | Admitting: Family Medicine

## 2017-09-18 ENCOUNTER — Other Ambulatory Visit (HOSPITAL_BASED_OUTPATIENT_CLINIC_OR_DEPARTMENT_OTHER): Payer: Self-pay | Admitting: Family Medicine

## 2017-09-18 ENCOUNTER — Encounter (HOSPITAL_BASED_OUTPATIENT_CLINIC_OR_DEPARTMENT_OTHER): Payer: Self-pay

## 2017-09-18 DIAGNOSIS — R109 Unspecified abdominal pain: Secondary | ICD-10-CM

## 2017-09-18 DIAGNOSIS — N200 Calculus of kidney: Secondary | ICD-10-CM | POA: Insufficient documentation

## 2017-09-18 DIAGNOSIS — Z9049 Acquired absence of other specified parts of digestive tract: Secondary | ICD-10-CM | POA: Insufficient documentation

## 2017-09-18 DIAGNOSIS — R160 Hepatomegaly, not elsewhere classified: Secondary | ICD-10-CM | POA: Diagnosis not present

## 2017-09-18 MED ORDER — IOPAMIDOL (ISOVUE-300) INJECTION 61%
100.0000 mL | Freq: Once | INTRAVENOUS | Status: AC | PRN
  Administered 2017-09-18: 100 mL via INTRAVENOUS

## 2017-10-19 ENCOUNTER — Other Ambulatory Visit (HOSPITAL_COMMUNITY): Payer: Self-pay | Admitting: Family Medicine

## 2017-10-19 DIAGNOSIS — R110 Nausea: Secondary | ICD-10-CM

## 2017-10-19 DIAGNOSIS — E1065 Type 1 diabetes mellitus with hyperglycemia: Secondary | ICD-10-CM

## 2017-10-28 ENCOUNTER — Ambulatory Visit (HOSPITAL_COMMUNITY)
Admission: RE | Admit: 2017-10-28 | Discharge: 2017-10-28 | Disposition: A | Discharge status: Home / Self Care | Payer: BLUE CROSS/BLUE SHIELD | Source: Ambulatory Visit | Attending: Family Medicine | Admitting: Family Medicine

## 2017-10-28 DIAGNOSIS — R110 Nausea: Secondary | ICD-10-CM | POA: Insufficient documentation

## 2017-10-28 DIAGNOSIS — E1065 Type 1 diabetes mellitus with hyperglycemia: Secondary | ICD-10-CM | POA: Diagnosis not present

## 2017-10-28 MED ORDER — TECHNETIUM TC 99M SULFUR COLLOID
2.0000 | Freq: Once | INTRAVENOUS | Status: AC | PRN
  Administered 2017-10-28: 2 via INTRAVENOUS

## 2017-10-30 ENCOUNTER — Other Ambulatory Visit: Payer: Self-pay | Admitting: Family

## 2017-10-30 DIAGNOSIS — D649 Anemia, unspecified: Secondary | ICD-10-CM

## 2017-11-02 ENCOUNTER — Inpatient Hospital Stay: Payer: BLUE CROSS/BLUE SHIELD | Attending: Family | Admitting: Family

## 2017-11-02 ENCOUNTER — Inpatient Hospital Stay: Payer: BLUE CROSS/BLUE SHIELD

## 2017-11-02 ENCOUNTER — Other Ambulatory Visit: Payer: Self-pay

## 2017-11-02 VITALS — BP 141/84 | HR 76 | Temp 98.4°F | Resp 18 | Wt 160.0 lb

## 2017-11-02 DIAGNOSIS — R531 Weakness: Secondary | ICD-10-CM | POA: Diagnosis not present

## 2017-11-02 DIAGNOSIS — D631 Anemia in chronic kidney disease: Secondary | ICD-10-CM | POA: Diagnosis present

## 2017-11-02 DIAGNOSIS — D509 Iron deficiency anemia, unspecified: Secondary | ICD-10-CM | POA: Insufficient documentation

## 2017-11-02 DIAGNOSIS — D649 Anemia, unspecified: Secondary | ICD-10-CM

## 2017-11-02 DIAGNOSIS — N182 Chronic kidney disease, stage 2 (mild): Secondary | ICD-10-CM | POA: Diagnosis not present

## 2017-11-02 DIAGNOSIS — D508 Other iron deficiency anemias: Secondary | ICD-10-CM

## 2017-11-02 DIAGNOSIS — K909 Intestinal malabsorption, unspecified: Secondary | ICD-10-CM | POA: Insufficient documentation

## 2017-11-02 DIAGNOSIS — R5383 Other fatigue: Secondary | ICD-10-CM | POA: Diagnosis not present

## 2017-11-02 DIAGNOSIS — E108 Type 1 diabetes mellitus with unspecified complications: Secondary | ICD-10-CM | POA: Insufficient documentation

## 2017-11-02 LAB — CMP (CANCER CENTER ONLY)
ALT: 13 U/L (ref 0–55)
ANION GAP: 12 — AB (ref 3–11)
AST: 21 U/L (ref 5–34)
Albumin: 4 g/dL (ref 3.5–5.0)
Alkaline Phosphatase: 113 U/L (ref 40–150)
BILIRUBIN TOTAL: 0.5 mg/dL (ref 0.2–1.2)
BUN: 14 mg/dL (ref 7–26)
CO2: 22 mmol/L (ref 22–29)
Calcium: 9.8 mg/dL (ref 8.4–10.4)
Chloride: 105 mmol/L (ref 98–109)
Creatinine: 0.91 mg/dL (ref 0.60–1.10)
Glucose, Bld: 281 mg/dL — ABNORMAL HIGH (ref 70–140)
POTASSIUM: 4.1 mmol/L (ref 3.5–5.1)
Sodium: 139 mmol/L (ref 136–145)
TOTAL PROTEIN: 7.6 g/dL (ref 6.4–8.3)

## 2017-11-02 LAB — CBC WITH DIFFERENTIAL (CANCER CENTER ONLY)
BASOS ABS: 0 10*3/uL (ref 0.0–0.1)
Basophils Relative: 1 %
Eosinophils Absolute: 0.2 10*3/uL (ref 0.0–0.5)
Eosinophils Relative: 3 %
HEMATOCRIT: 32.1 % — AB (ref 34.8–46.6)
HEMOGLOBIN: 9.9 g/dL — AB (ref 11.6–15.9)
LYMPHS PCT: 29 %
Lymphs Abs: 1.8 10*3/uL (ref 0.9–3.3)
MCH: 24.6 pg — ABNORMAL LOW (ref 26.0–34.0)
MCHC: 30.8 g/dL — ABNORMAL LOW (ref 32.0–36.0)
MCV: 79.9 fL — AB (ref 81.0–101.0)
Monocytes Absolute: 0.3 10*3/uL (ref 0.1–0.9)
Monocytes Relative: 5 %
NEUTROS ABS: 4.1 10*3/uL (ref 1.5–6.5)
NEUTROS PCT: 64 %
PLATELETS: 223 10*3/uL (ref 145–400)
RBC: 4.02 MIL/uL (ref 3.70–5.32)
RDW: 18.7 % — ABNORMAL HIGH (ref 11.1–15.7)
WBC: 6.5 10*3/uL (ref 3.9–10.0)

## 2017-11-02 LAB — RETICULOCYTES
RBC.: 4.04 MIL/uL (ref 3.70–5.45)
RETIC COUNT ABSOLUTE: 36.4 10*3/uL (ref 33.7–90.7)
Retic Ct Pct: 0.9 % (ref 0.7–2.1)

## 2017-11-02 LAB — SAVE SMEAR

## 2017-11-02 LAB — LACTATE DEHYDROGENASE: LDH: 158 U/L (ref 125–245)

## 2017-11-02 NOTE — Progress Notes (Signed)
Hematology/Oncology Consultation   Name: Rhonda Gutierrez      MRN: [id number]    Location: Room/bed info not found  Date: 11/02/2017 Time:2:43 PM   REFERRING PHYSICIAN: Hulan Fess, MD  REASON FOR CONSULT: Anemia   DIAGNOSIS: Iron deficiency anemia secondary to malabsorption  Anemia of chronic kidney failure stage 2  HISTORY OF PRESENT ILLNESS: Ms. Whitsitt is a very pleasant 57 yo caucasian female with persistent anemia. So far, she has not required a transfusion. She has been taking an oral iron supplement with no improvement. Ferritin a month ago was 7.2. Hgb is 9.9, platelets 223.  She is symptomatic at this time with fatigue and weakness.  She has had no issue with bleeding. No bruising or petechiae.  She has history of severe ulcerative colitis requiring removal of the large intestins and rectum as well as appendix in 2007. She now has a J pouch and has chronic diarrhea. She is on the maximum daily dose of imodium to help control this.  She states that she was diagnosed as a type 1 diabetic (triggered by UC) in 2008. Her blood sugars are up and down.  She has been experiencing painful nausea since early January. CT of the abdomen and gastric emptying study were both negative.  Her appetite has been decreased due to the nausea. She is trying her best to stay hydrated but easily becomes dehydrated. She states that her weight is down 8 lbs since January.   She has had no fever, chills, cough, rash, dizziness, SOB, chest pain, palpitations or changes in bladder habits.  She does B-12 injections monthly herself at home.  She has been on Depo since her late 20's for birth control. She had preeclampsia with bother her daughters and was advised not to have any more children. Her female organs are intact, she has no cycle. No history of miscarriage.  No personal history of cancer. Her father passed away shortly after being diagnosed with a [place] tumor.  She has numbness and tingling in her feet  at times due to plantar fascitis. No swelling or tenderness in her extremities at this time.  She does not smoke and will only rarely have an alcoholic beverage with dinner.  She works 2 jobs. 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 Saturday and Sunday she works at [employer].   ROS: All other 10 point review of systems is negative.   PAST MEDICAL HISTORY:   Past Medical History:  Diagnosis Date  . Dyslipidemia   . Fatty liver disease, nonalcoholic   . Type I (juvenile type) diabetes mellitus without mention of complication, not stated as uncontrolled   . Ulcerative colitis, unspecified   . Unspecified essential hypertension     ALLERGIES: Allergies  Allergen Reactions  . Aspartame Other (See Comments)    Shaking  Shaking   . Ciprofloxacin Other (See Comments) and Nausea And Vomiting    pain  . Codeine Nausea And Vomiting and Nausea Only  . Fish Oil Cough  . Losartan Cough  . Mesalamine Other (See Comments)    Seen things Seen things  . Metronidazole Other (See Comments) and Nausea And Vomiting    pain  . Rofecoxib Other (See Comments)    Hallucinations  Other reaction(s): Psychosis (intolerance) Hallucinations  . Clarithromycin Rash  . Sulfa Antibiotics Rash    fever  fever  . Sulfasalazine Rash    fever  . Sulfonamide Derivatives Rash    fever  MEDICATIONS:  Current Outpatient Medications on File Prior to Visit  Medication Sig Dispense Refill  . ALPRAZolam (XANAX) 0.25 MG tablet as needed.    Marland Kitchen aspirin 81 MG tablet Take 81 mg by mouth daily.      Marland Kitchen atorvastatin (LIPITOR) 80 MG tablet Take 80 mg by mouth daily.      . cloNIDine (CATAPRES) 0.1 MG tablet Take 0.1 mg by mouth at bedtime.      . Continuous Glucose Monitor DEVI Inject 1 Device into the skin once a week.    . cyanocobalamin (,VITAMIN B-12,) 1000 MCG/ML injection Inject into the muscle every 30 (thirty) days.    Marland Kitchen esomeprazole (NEXIUM) 20 MG capsule  Take 40 mg by mouth daily. At bedtime.    . famotidine (PEPCID) 20 MG tablet Take 20 mg by mouth daily.      . fenofibrate 160 MG tablet Take 160 mg by mouth daily.      . fluticasone (FLONASE) 50 MCG/ACT nasal spray Place into both nostrils daily.    . folic acid (FOLVITE) 1 MG tablet Take 1 mg by mouth daily.    Marland Kitchen gabapentin (NEURONTIN) 600 MG tablet Take 600 mg by mouth 3 (three) times daily.    Marland Kitchen GLUCAGEN HYPOKIT 1 MG SOLR injection INJECT 1 MG INTO THE MUSCLE ONCE AS DIRECTED 1 mg 5  . HUMALOG 100 UNIT/ML injection USE IN INSULIN PUMP FOR A TOTAL OF 230 UNITS PER DAY 70 mL 0  . Insulin Infusion Pump Supplies (PARADIGM QUICK-SET 32" 9MM) MISC 1 Device by Does not apply route every 3 (three) days. 30 each 3  . Insulin Infusion Pump Supplies (PARADIGM RESERVOIR 3ML) MISC 1 Device by Does not apply route every 3 (three) days. 10 each 3  . insulin lispro (HUMALOG KWIKPEN) 100 UNIT/ML KiwkPen Use as directed if pump does not work 15 mL 3  . loperamide (IMODIUM) 2 MG capsule Take 8 mg by mouth 2 (two) times daily.     Marland Kitchen loratadine (CLARITIN) 10 MG tablet Take 10 mg by mouth daily.    . medroxyPROGESTERone (DEPO-PROVERA) 150 MG/ML injection Inject 150 mg into the muscle every 3 (three) months.      . Multiple Vitamin (MULTIVITAMIN) capsule Take 1 capsule by mouth daily.      . ondansetron (ZOFRAN) 8 MG tablet Take 1 tablet (8 mg total) by mouth every 8 (eight) hours as needed for nausea or vomiting. 10 tablet 0  . ramipril (ALTACE) 2.5 MG capsule Take 2.5 mg by mouth 2 (two) times daily. Further refills through PCP.    Marland Kitchen Vitamin D, Ergocalciferol, (DRISDOL) 50000 units CAPS capsule Take 1 capsule (50,000 Units total) by mouth every 7 (seven) days. 12 capsule 0  . vortioxetine HBr (TRINTELLIX) 20 MG TABS Take 20 mg by mouth daily.    . [DISCONTINUED] insulin aspart (NOVOLOG) 100 UNIT/ML injection For use in pump, total of 230 units per day 7 vial 2  . [DISCONTINUED] Omeprazole-Sodium Bicarbonate  (ZEGERID) 20-1100 MG CAPS Take 1 capsule by mouth daily.     No current facility-administered medications on file prior to visit.      PAST SURGICAL HISTORY Past Surgical History:  Procedure Laterality Date  . APPENDECTOMY    . CARPAL TUNNEL RELEASE    . CHOLECYSTECTOMY  1993  . ILEOSTOMY  06/17/2006   take down  . Insulin pump    . mass removal      right arm, benign  . NASAL SINUS SURGERY  02/10/1994  . PROTOCOLECTOMY  12/08/2005   total  . stoma  2007   reversal of ileostomy  . WISDOM TOOTH EXTRACTION  1981    FAMILY HISTORY: Family History  Problem Relation Age of Onset  . Cancer Father   . Allergy (severe) Daughter     SOCIAL HISTORY:  reports that  has never smoked. she has never used smokeless tobacco. She reports that she drinks alcohol. She reports that she does not use drugs.  PERFORMANCE STATUS: The patient's performance status is 1 - Symptomatic but completely ambulatory  PHYSICAL EXAM: Most Recent Vital Signs: Blood pressure (!) 141/84, pulse 76, temperature 98.4 F (36.9 C), temperature source Oral, resp. rate 18, weight 160 lb (72.6 kg), SpO2 100 %. BP (!) 141/84 (BP Location: Left Arm, Patient Position: Sitting)   Pulse 76   Temp 98.4 F (36.9 C) (Oral)   Resp 18   Wt 160 lb (72.6 kg)   SpO2 100%   BMI 29.26 kg/m   General Appearance:    Alert, cooperative, no distress, appears stated age  Head:    Normocephalic, without obvious abnormality, atraumatic  Eyes:    PERRL, conjunctiva/corneas clear, EOM's intact, fundi    benign, both eyes        Throat:   Lips, mucosa, and tongue normal; teeth and gums normal  Neck:   Supple, symmetrical, trachea midline, no adenopathy;    thyroid:  no enlargement/tenderness/nodules; no carotid   bruit or JVD  Back:     Symmetric, no curvature, ROM normal, no CVA tenderness  Lungs:     Clear to auscultation bilaterally, respirations unlabored  Chest Wall:    No tenderness or deformity   Heart:    Regular rate  and rhythm, S1 and S2 normal, no murmur, rub   or gallop     Abdomen:     Soft, non-tender, bowel sounds active all four quadrants,    no masses, no organomegaly        Extremities:   Extremities normal, atraumatic, no cyanosis or edema  Pulses:   2+ and symmetric all extremities  Skin:   Skin color, texture, turgor normal, no rashes or lesions  Lymph nodes:   Cervical, supraclavicular, and axillary nodes normal  Neurologic:   CNII-XII intact, normal strength, sensation and reflexes    throughout    LABORATORY DATA:  Results for orders placed or performed in visit on 11/02/17 (from the past 48 hour(s))  Save smear     Status: None   Collection Time: 11/02/17  1:19 PM  Result Value Ref Range   Smear Review SMEAR STAINED AND AVAILABLE FOR REVIEW     Comment: Performed at [facility] at [facility], [address] [place], [place] [address]  CBC with Differential (Cancer Center Only)     Status: Abnormal   Collection Time: 11/02/17  1:20 PM  Result Value Ref Range   WBC Count 6.5 3.9 - 10.0 K/uL   RBC 4.02 3.70 - 5.32 MIL/uL   Hemoglobin 9.9 (L) 11.6 - 15.9 g/dL   HCT 32.1 (L) 34.8 - 46.6 %   MCV 79.9 (L) 81.0 - 101.0 fL   MCH 24.6 (L) 26.0 - 34.0 pg   MCHC 30.8 (L) 32.0 - 36.0 g/dL   RDW 18.7 (H) 11.1 - 15.7 %   Platelet Count 223 145 - 400 K/uL   Neutrophils Relative % 64 %   Neutro Abs 4.1 1.5 - 6.5 K/uL  Lymphocytes Relative 29 %   Lymphs Abs 1.8 0.9 - 3.3 K/uL   Monocytes Relative 5 %   Monocytes Absolute 0.3 0.1 - 0.9 K/uL   Eosinophils Relative 3 %   Eosinophils Absolute 0.2 0.0 - 0.5 K/uL   Basophils Relative 1 %   Basophils Absolute 0.0 0.0 - 0.1 K/uL    Comment: Performed at [facility] at [facility], [address], [place], [place] [address]      RADIOGRAPHY: No results found.     PATHOLOGY: None  ASSESSMENT/PLAN: Ms. Argyle is a very pleasant 57 yo caucasian female with persistent anemia due to  malabsorption despite taking an oral iron supplement. Her iron saturation is now 10% and ferritin 14. She is symptomatic with weakness and fatigue as mentioned above.  We will give her a dose of IV iron this week and a second dose next week and plan to see her back in another month for follow-up.   All questions were answered and she is in agreement with the plan. She will contact our office with any questions or concerns. We can certainly see her much sooner if necessary.  She was discussed with and also seen by Dr. Marin Olp and he is in agreement with the aforementioned.   Weslie Rasmus [facility]     Addendum: I agree with the above assessment by Judson Roch.  I saw and examined the patient with Judson Roch.  I looked at Ms.Schwering's blood smear.  It looked suspicious for iron deficiency.  She has some microcytic and hypochromic red blood cells.  Her white pleasant.  Normal in morphology.  Her platelets were slightly increased in number.    Her iron studies show ferritin of 14 with an iron saturation of 10%.  Her blood sugars are on the high side.  I suspect that she also may have some element of erythropoietin deficiency if she does have diabetes that is uncontrolled.  We will go ahead and give her IV iron.  I know that this will help her.  I know this will help improve her hemoglobin and will raise her MCV.  We spent about 85 minutes with her.  Over 50% of time was spent face-to-face with her.  We reviewed all of her lab work.  We answered all of her questions.  She is very very nice.  She is a dachshund lover.  We talked about the [facility] dog show.  We will plan to see her back in another 6 weeks.  Lattie Haw, MD

## 2017-11-03 LAB — IRON AND TIBC
Iron: 51 ug/dL (ref 41–142)
SATURATION RATIOS: 10 % — AB (ref 21–57)
TIBC: 489 ug/dL — ABNORMAL HIGH (ref 236–444)
UIBC: 438 ug/dL

## 2017-11-03 LAB — FERRITIN: Ferritin: 14 ng/mL (ref 9–269)

## 2017-11-03 LAB — ERYTHROPOIETIN: ERYTHROPOIETIN: 10.6 m[IU]/mL (ref 2.6–18.5)

## 2017-11-04 ENCOUNTER — Other Ambulatory Visit: Payer: BLUE CROSS/BLUE SHIELD | Admitting: Rheumatology

## 2017-11-05 ENCOUNTER — Other Ambulatory Visit: Payer: Self-pay | Admitting: Family

## 2017-11-05 ENCOUNTER — Inpatient Hospital Stay: Payer: BLUE CROSS/BLUE SHIELD

## 2017-11-05 VITALS — BP 110/72 | HR 82 | Temp 98.2°F | Resp 17

## 2017-11-05 DIAGNOSIS — D508 Other iron deficiency anemias: Secondary | ICD-10-CM

## 2017-11-05 DIAGNOSIS — D509 Iron deficiency anemia, unspecified: Secondary | ICD-10-CM | POA: Insufficient documentation

## 2017-11-05 MED ORDER — SODIUM CHLORIDE 0.9 % IV SOLN
Freq: Once | INTRAVENOUS | Status: AC
  Administered 2017-11-05: 14:00:00 via INTRAVENOUS

## 2017-11-05 MED ORDER — SODIUM CHLORIDE 0.9 % IV SOLN
510.0000 mg | Freq: Once | INTRAVENOUS | Status: AC
  Administered 2017-11-05: 510 mg via INTRAVENOUS
  Filled 2017-11-05: qty 17

## 2017-11-05 NOTE — Patient Instructions (Signed)

## 2017-11-12 ENCOUNTER — Inpatient Hospital Stay: Payer: BLUE CROSS/BLUE SHIELD

## 2017-11-18 ENCOUNTER — Inpatient Hospital Stay: Payer: BLUE CROSS/BLUE SHIELD

## 2017-11-18 ENCOUNTER — Other Ambulatory Visit: Payer: Self-pay

## 2017-11-18 VITALS — BP 110/59 | HR 84 | Temp 98.1°F | Resp 18

## 2017-11-18 DIAGNOSIS — D508 Other iron deficiency anemias: Secondary | ICD-10-CM

## 2017-11-18 DIAGNOSIS — D509 Iron deficiency anemia, unspecified: Secondary | ICD-10-CM | POA: Diagnosis not present

## 2017-11-18 MED ORDER — SODIUM CHLORIDE 0.9 % IV SOLN
Freq: Once | INTRAVENOUS | Status: AC
  Administered 2017-11-18: 14:00:00 via INTRAVENOUS

## 2017-11-18 MED ORDER — SODIUM CHLORIDE 0.9 % IV SOLN
510.0000 mg | Freq: Once | INTRAVENOUS | Status: AC
  Administered 2017-11-18: 510 mg via INTRAVENOUS
  Filled 2017-11-18: qty 17

## 2017-11-18 NOTE — Patient Instructions (Signed)

## 2017-11-30 ENCOUNTER — Other Ambulatory Visit: Payer: Self-pay

## 2017-11-30 ENCOUNTER — Inpatient Hospital Stay: Payer: BLUE CROSS/BLUE SHIELD | Attending: Family | Admitting: Family

## 2017-11-30 ENCOUNTER — Inpatient Hospital Stay: Payer: BLUE CROSS/BLUE SHIELD

## 2017-11-30 VITALS — BP 140/86 | HR 87 | Temp 98.7°F | Resp 18 | Wt 158.0 lb

## 2017-11-30 DIAGNOSIS — D509 Iron deficiency anemia, unspecified: Secondary | ICD-10-CM | POA: Diagnosis not present

## 2017-11-30 DIAGNOSIS — D508 Other iron deficiency anemias: Secondary | ICD-10-CM

## 2017-11-30 DIAGNOSIS — K909 Intestinal malabsorption, unspecified: Secondary | ICD-10-CM | POA: Diagnosis not present

## 2017-11-30 DIAGNOSIS — K2970 Gastritis, unspecified, without bleeding: Secondary | ICD-10-CM | POA: Diagnosis not present

## 2017-11-30 DIAGNOSIS — N182 Chronic kidney disease, stage 2 (mild): Secondary | ICD-10-CM | POA: Insufficient documentation

## 2017-11-30 DIAGNOSIS — R0602 Shortness of breath: Secondary | ICD-10-CM | POA: Insufficient documentation

## 2017-11-30 DIAGNOSIS — R5383 Other fatigue: Secondary | ICD-10-CM | POA: Diagnosis not present

## 2017-11-30 DIAGNOSIS — D631 Anemia in chronic kidney disease: Secondary | ICD-10-CM | POA: Insufficient documentation

## 2017-11-30 LAB — CMP (CANCER CENTER ONLY)
ALT: 22 U/L (ref 10–47)
ANION GAP: 8 (ref 5–15)
AST: 30 U/L (ref 11–38)
Albumin: 4 g/dL (ref 3.5–5.0)
Alkaline Phosphatase: 114 U/L — ABNORMAL HIGH (ref 26–84)
BILIRUBIN TOTAL: 1.2 mg/dL (ref 0.2–1.6)
BUN: 12 mg/dL (ref 7–22)
CALCIUM: 10.2 mg/dL (ref 8.0–10.3)
CO2: 28 mmol/L (ref 18–33)
Chloride: 107 mmol/L (ref 98–108)
Creatinine: 0.7 mg/dL (ref 0.60–1.20)
Glucose, Bld: 121 mg/dL — ABNORMAL HIGH (ref 73–118)
POTASSIUM: 4.1 mmol/L (ref 3.3–4.7)
Sodium: 143 mmol/L (ref 128–145)
Total Protein: 8.2 g/dL — ABNORMAL HIGH (ref 6.4–8.1)

## 2017-11-30 LAB — CBC WITH DIFFERENTIAL (CANCER CENTER ONLY)
BASOS ABS: 0 10*3/uL (ref 0.0–0.1)
Basophils Relative: 0 %
Eosinophils Absolute: 0.2 10*3/uL (ref 0.0–0.5)
Eosinophils Relative: 3 %
HEMATOCRIT: 35.8 % (ref 34.8–46.6)
Hemoglobin: 11.6 g/dL (ref 11.6–15.9)
LYMPHS ABS: 2.5 10*3/uL (ref 0.9–3.3)
LYMPHS PCT: 35 %
MCH: 26.9 pg (ref 26.0–34.0)
MCHC: 32.4 g/dL (ref 32.0–36.0)
MCV: 82.9 fL (ref 81.0–101.0)
MONO ABS: 0.3 10*3/uL (ref 0.1–0.9)
MONOS PCT: 4 %
NEUTROS ABS: 4.1 10*3/uL (ref 1.5–6.5)
Neutrophils Relative %: 58 %
Platelet Count: 237 10*3/uL (ref 145–400)
RBC: 4.32 MIL/uL (ref 3.70–5.32)
RDW: 17.4 % — AB (ref 11.1–15.7)
WBC Count: 7.2 10*3/uL (ref 3.9–10.0)

## 2017-11-30 NOTE — Progress Notes (Signed)
Hematology and Oncology Follow Up Visit  Rhonda Gutierrez [id number] 11-14-1960 57 y.o. 11/30/2017   Principle Diagnosis:  Iron deficiency anemia secondary to malabsorption  Anemia of chronic kidney failure stage 2  Current Therapy:   IV iron as indicated - last received in March 2019 x 2   Interim History:  Rhonda Gutierrez is here today for follow-up. 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  She denies having noticed any bleeding, no bruising or petechiae.  She received 2 doses of IV iron in March. Hgb is now 11.6 with an MCV of 82.  She is still having fatigued and SOB with exertion (stairs).  No fever, chills, n/v, cough, rash, dizziness, chest pain, palpitations, abdominal pain or changes in bowel or bladder habits.  She still has numbness and tingling in her feet. This is unchanged. No swelling or tenderness in her extremities. No c/o pain.  No lymphadenopathy found on exam.  She has maintained a good appetite and is staying well hydrated. Her weight is stable.   ECOG Performance Status: 1 - Symptomatic but completely ambulatory  Medications:  Allergies as of 11/30/2017      Reactions   Aspartame Other (See Comments)   Shaking  Shaking    Ciprofloxacin Other (See Comments), Nausea And Vomiting   pain   Codeine Nausea And Vomiting, Nausea Only   Fish Oil Cough   Losartan Cough   Mesalamine Other (See Comments)   Seen things Seen things   Metronidazole Other (See Comments), Nausea And Vomiting   pain   Rofecoxib Other (See Comments)   Hallucinations Other reaction(s): Psychosis (intolerance) Hallucinations   Clarithromycin Rash   Sulfa Antibiotics Rash   fever fever   Sulfasalazine Rash   fever   Sulfonamide Derivatives Rash   fever      Medication List        Accurate as of 11/30/17  1:27 PM. Always use your most recent med list.          ALPRAZolam 0.25 MG tablet Commonly  known as:  XANAX as needed.   aspirin 81 MG tablet Take 81 mg by mouth daily.   atorvastatin 80 MG tablet Commonly known as:  LIPITOR Take 80 mg by mouth daily.   cloNIDine 0.1 MG tablet Commonly known as:  CATAPRES Take 0.1 mg by mouth at bedtime.   Continuous Glucose Monitor Devi Inject 1 Device into the skin once a week.   cyanocobalamin 1000 MCG/ML injection Commonly known as:  (VITAMIN B-12) Inject into the muscle every 30 (thirty) days.   esomeprazole 20 MG capsule Commonly known as:  NEXIUM Take 40 mg by mouth daily. At bedtime.   famotidine 20 MG tablet Commonly known as:  PEPCID Take 20 mg by mouth daily.   fenofibrate 160 MG tablet Take 160 mg by mouth daily.   fluticasone 50 MCG/ACT nasal spray Commonly known as:  FLONASE Place into both nostrils daily.   folic acid 1 MG tablet Commonly known as:  FOLVITE Take 1 mg by mouth daily.   gabapentin 600 MG tablet Commonly known as:  NEURONTIN Take 600 mg by mouth 3 (three) times daily.   GLUCAGEN HYPOKIT 1 MG Solr injection Generic drug:  glucagon INJECT 1 MG INTO THE MUSCLE ONCE AS DIRECTED   insulin lispro 100 UNIT/ML KiwkPen Commonly known as:  HUMALOG KWIKPEN Use as directed if pump does not work   HUMALOG 100 UNIT/ML  injection Generic drug:  insulin lispro USE IN INSULIN PUMP FOR A TOTAL OF 230 UNITS PER DAY   loperamide 2 MG capsule Commonly known as:  IMODIUM Take 8 mg by mouth 2 (two) times daily.   loratadine 10 MG tablet Commonly known as:  CLARITIN Take 10 mg by mouth daily.   medroxyPROGESTERone 150 MG/ML injection Commonly known as:  DEPO-PROVERA Inject 150 mg into the muscle every 3 (three) months.   multivitamin capsule Take 1 capsule by mouth daily.   ondansetron 8 MG tablet Commonly known as:  ZOFRAN Take 1 tablet (8 mg total) by mouth every 8 (eight) hours as needed for nausea or vomiting.   PARADIGM RESERVOIR 3ML Misc 1 Device by Does not apply route every 3 (three)  days.   PARADIGM QUICK-SET 32" 9MM Misc 1 Device by Does not apply route every 3 (three) days.   ramipril 2.5 MG capsule Commonly known as:  ALTACE Take 2.5 mg by mouth 2 (two) times daily. Further refills through PCP.   Vitamin D (Ergocalciferol) 50000 units Caps capsule Commonly known as:  DRISDOL Take 1 capsule (50,000 Units total) by mouth every 7 (seven) days.   vortioxetine HBr 20 MG Tabs tablet Commonly known as:  TRINTELLIX Take 20 mg by mouth daily.       Allergies:  Allergies  Allergen Reactions  . Aspartame Other (See Comments)    Shaking  Shaking   . Ciprofloxacin Other (See Comments) and Nausea And Vomiting    pain  . Codeine Nausea And Vomiting and Nausea Only  . Fish Oil Cough  . Losartan Cough  . Mesalamine Other (See Comments)    Seen things Seen things  . Metronidazole Other (See Comments) and Nausea And Vomiting    pain  . Rofecoxib Other (See Comments)    Hallucinations  Other reaction(s): Psychosis (intolerance) Hallucinations  . Clarithromycin Rash  . Sulfa Antibiotics Rash    fever  fever  . Sulfasalazine Rash    fever  . Sulfonamide Derivatives Rash    fever    Past Medical History, Surgical history, Social history, and Family History were reviewed and updated.  Review of Systems: All other 10 point review of systems is negative.   Physical Exam:  vitals were not taken for this visit.   Wt Readings from Last 3 Encounters:  11/02/17 160 lb (72.6 kg)  08/05/17 165 lb (74.8 kg)  06/17/17 167 lb (75.8 kg)    Ocular: Sclerae unicteric, pupils equal, round and reactive to light Ear-nose-throat: Oropharynx clear, dentition fair Lymphatic: No cervical, supraclavicular or axillary adenopathy Lungs no rales or rhonchi, good excursion bilaterally Heart regular rate and rhythm, no murmur appreciated Abd soft, nontender, positive bowel sounds, no liver or spleen tip palpated on exam, no fluid wave  MSK no focal spinal tenderness, no  joint edema Neuro: non-focal, well-oriented, appropriate affect Breasts: Deferred   Lab Results  Component Value Date   WBC 6.5 11/02/2017   HGB 11.6 (L) 07/31/2015   HCT 32.1 (L) 11/02/2017   MCV 79.9 (L) 11/02/2017   PLT 223 11/02/2017   Lab Results  Component Value Date   FERRITIN 14 11/02/2017   IRON 51 11/02/2017   TIBC 489 (H) 11/02/2017   UIBC 438 11/02/2017   IRONPCTSAT 10 (L) 11/02/2017   Lab Results  Component Value Date   RETICCTPCT 0.9 11/02/2017   RBC 4.04 11/02/2017   RBC 4.02 11/02/2017   No results found for: KPAFRELGTCHN, LAMBDASER, KAPLAMBRATIO No results  found for: IGGSERUM, IGA, IGMSERUM No results found for: Odetta Pink, SPEI   Chemistry      Component Value Date/Time   NA 139 11/02/2017 1320   K 4.1 11/02/2017 1320   CL 105 11/02/2017 1320   CO2 22 11/02/2017 1320   BUN 14 11/02/2017 1320   CREATININE 0.91 11/02/2017 1320      Component Value Date/Time   CALCIUM 9.8 11/02/2017 1320   CALCIUM 9.7 04/08/2011 1053   ALKPHOS 113 11/02/2017 1320   AST 21 11/02/2017 1320   ALT 13 11/02/2017 1320   BILITOT 0.5 11/02/2017 1320      Impression and Plan: Ms. Szatkowski is a very pleasant 57 yo caucasian female with iron deficiency anemia secondary to autoimmune gastritis. She received IV iron twice in March. Hgb is now 11.6.  She is still having some fatigue and SOB with exertion.  We will see what her iron studies show and bring her back in for infusion if needed.  We will go ahead and plan to see her back in another 6 weeks for follow-up.  She will contact our office with any questions or concerns. We can certainly see her sooner if need be.   Laverna Peace, NP 4/1/20191:27 PM

## 2017-12-01 ENCOUNTER — Encounter: Payer: Self-pay | Admitting: Hematology & Oncology

## 2017-12-01 LAB — IRON AND TIBC
IRON: 102 ug/dL (ref 41–142)
Saturation Ratios: 27 % (ref 21–57)
TIBC: 384 ug/dL (ref 236–444)
UIBC: 282 ug/dL

## 2017-12-01 LAB — FERRITIN: FERRITIN: 366 ng/mL — AB (ref 9–269)

## 2018-01-11 ENCOUNTER — Inpatient Hospital Stay (HOSPITAL_BASED_OUTPATIENT_CLINIC_OR_DEPARTMENT_OTHER): Payer: BLUE CROSS/BLUE SHIELD | Admitting: Family

## 2018-01-11 ENCOUNTER — Other Ambulatory Visit: Payer: Self-pay

## 2018-01-11 ENCOUNTER — Encounter: Payer: Self-pay | Admitting: Family

## 2018-01-11 ENCOUNTER — Inpatient Hospital Stay: Payer: BLUE CROSS/BLUE SHIELD | Attending: Family

## 2018-01-11 VITALS — BP 121/70 | HR 78 | Temp 98.9°F | Resp 16 | Wt 159.0 lb

## 2018-01-11 DIAGNOSIS — D509 Iron deficiency anemia, unspecified: Secondary | ICD-10-CM

## 2018-01-11 DIAGNOSIS — E559 Vitamin D deficiency, unspecified: Secondary | ICD-10-CM

## 2018-01-11 DIAGNOSIS — N182 Chronic kidney disease, stage 2 (mild): Secondary | ICD-10-CM

## 2018-01-11 DIAGNOSIS — D631 Anemia in chronic kidney disease: Secondary | ICD-10-CM | POA: Diagnosis not present

## 2018-01-11 DIAGNOSIS — D508 Other iron deficiency anemias: Secondary | ICD-10-CM

## 2018-01-11 LAB — CMP (CANCER CENTER ONLY)
ALK PHOS: 113 U/L (ref 40–150)
ALT: 11 U/L (ref 0–55)
AST: 20 U/L (ref 5–34)
Albumin: 4.1 g/dL (ref 3.5–5.0)
Anion gap: 10 (ref 3–11)
BILIRUBIN TOTAL: 0.8 mg/dL (ref 0.2–1.2)
BUN: 15 mg/dL (ref 7–26)
CALCIUM: 9.7 mg/dL (ref 8.4–10.4)
CO2: 24 mmol/L (ref 22–29)
CREATININE: 0.85 mg/dL (ref 0.60–1.10)
Chloride: 106 mmol/L (ref 98–109)
Glucose, Bld: 201 mg/dL — ABNORMAL HIGH (ref 70–140)
Potassium: 4.6 mmol/L (ref 3.5–5.1)
Sodium: 140 mmol/L (ref 136–145)
TOTAL PROTEIN: 7.7 g/dL (ref 6.4–8.3)

## 2018-01-11 LAB — CBC WITH DIFFERENTIAL (CANCER CENTER ONLY)
BASOS ABS: 0 10*3/uL (ref 0.0–0.1)
Basophils Relative: 1 %
EOS PCT: 3 %
Eosinophils Absolute: 0.2 10*3/uL (ref 0.0–0.5)
HEMATOCRIT: 33.4 % — AB (ref 34.8–46.6)
Hemoglobin: 11.1 g/dL — ABNORMAL LOW (ref 11.6–15.9)
LYMPHS ABS: 2.4 10*3/uL (ref 0.9–3.3)
Lymphocytes Relative: 31 %
MCH: 29.4 pg (ref 26.0–34.0)
MCHC: 33.2 g/dL (ref 32.0–36.0)
MCV: 88.6 fL (ref 81.0–101.0)
MONO ABS: 0.4 10*3/uL (ref 0.1–0.9)
MONOS PCT: 5 %
NEUTROS ABS: 4.8 10*3/uL (ref 1.5–6.5)
Neutrophils Relative %: 60 %
Platelet Count: 203 10*3/uL (ref 145–400)
RBC: 3.77 MIL/uL (ref 3.70–5.32)
RDW: 15.2 % (ref 11.1–15.7)
WBC Count: 7.8 10*3/uL (ref 3.9–10.0)

## 2018-01-11 NOTE — Progress Notes (Signed)
Hematology and Oncology Follow Up Visit  Nance Mccombs [id number] 01/02/1961 57 y.o. 01/11/2018   Principle Diagnosis:  Iron deficiency anemiasecondary to malabsorption Anemia of chronic kidney failure stage 2  Current Therapy:   IV iron as indicated - last received in March 2019 x 2   Interim History:  Ms. Whiting is here today for follow-up. She is symptomatic with fatigued.  She has not noticed any bleeding, no bruising or petechiae.  SOB with over exertion on stairs is unchanged.  No fever, chills, n/v, cough, rash, dizziness, chest pain, palpitations, abdominal pain or changes in bowel or bladder habits. No lymphadenopathy noted on exam.  No swelling, tenderness, numbness or tingling in her extremities at this time.  She is eating well and staying well hydrated. Her weight is stable. Her blood sugars are fairly well controlled and she is hoping to get a new insulin pump later this year.   ECOG Performance Status: 1 - Symptomatic but completely ambulatory  Medications:  Allergies as of 01/11/2018      Reactions   Aspartame Other (See Comments)   Shaking  Shaking  Shaking    Ciprofloxacin Other (See Comments), Nausea And Vomiting   pain   Fish Oil Cough   Losartan Cough   Mesalamine Other (See Comments)   Seen things Seen things Seen things   Metronidazole Other (See Comments), Nausea And Vomiting   pain   Rofecoxib Other (See Comments)   Hallucinations Other reaction(s): Psychosis (intolerance) Hallucinations Hallucinations   Clarithromycin Rash   Codeine Nausea And Vomiting, Nausea Only   Sulfa Antibiotics Rash   fever fever   Sulfasalazine Rash   fever   Sulfonamide Derivatives Rash   fever      Medication List        Accurate as of 01/11/18  2:03 PM. Always use your most recent med list.          ALPRAZolam 0.25 MG tablet Commonly known as:  XANAX as needed.   aspirin 81 MG tablet Take 81 mg by mouth daily.   atorvastatin 80 MG  tablet Commonly known as:  LIPITOR Take 80 mg by mouth daily.   cloNIDine 0.1 MG tablet Commonly known as:  CATAPRES Take 0.1 mg by mouth at bedtime.   Continuous Glucose Monitor Devi Inject 1 Device into the skin once a week.   cyanocobalamin 1000 MCG/ML injection Commonly known as:  (VITAMIN B-12) Inject into the muscle every 30 (thirty) days.   esomeprazole 40 MG capsule Commonly known as:  NEXIUM Take 40 mg by mouth daily. At bedtime.   famotidine 20 MG tablet Commonly known as:  PEPCID Take 20 mg by mouth daily.   fenofibrate 160 MG tablet Take 160 mg by mouth daily.   fluticasone 50 MCG/ACT nasal spray Commonly known as:  FLONASE Place into both nostrils daily.   folic acid 1 MG tablet Commonly known as:  FOLVITE Take 1 mg by mouth daily.   gabapentin 600 MG tablet Commonly known as:  NEURONTIN Take 600 mg by mouth 3 (three) times daily.   GLUCAGEN HYPOKIT 1 MG Solr injection Generic drug:  glucagon INJECT 1 MG INTO THE MUSCLE ONCE AS DIRECTED   insulin lispro 100 UNIT/ML KiwkPen Commonly known as:  HUMALOG KWIKPEN Use as directed if pump does not work   HUMALOG 100 UNIT/ML injection Generic drug:  insulin lispro USE IN INSULIN PUMP FOR A TOTAL OF 230 UNITS PER DAY   loperamide 2 MG capsule Commonly known  as:  IMODIUM Take 8 mg by mouth 2 (two) times daily.   loratadine 10 MG tablet Commonly known as:  CLARITIN Take 10 mg by mouth daily.   medroxyPROGESTERone 150 MG/ML injection Commonly known as:  DEPO-PROVERA Inject 150 mg into the muscle every 3 (three) months.   metoCLOPramide 10 MG tablet Commonly known as:  REGLAN Take by mouth.   multivitamin capsule Take 1 capsule by mouth daily.   ondansetron 8 MG tablet Commonly known as:  ZOFRAN Take 1 tablet (8 mg total) by mouth every 8 (eight) hours as needed for nausea or vomiting.   PARADIGM RESERVOIR 3ML Misc 1 Device by Does not apply route every 3 (three) days.   PARADIGM QUICK-SET  32" 9MM Misc 1 Device by Does not apply route every 3 (three) days.   ramipril 2.5 MG capsule Commonly known as:  ALTACE Take 2.5 mg by mouth 2 (two) times daily. Further refills through PCP.   sucralfate 1 g tablet Commonly known as:  CARAFATE Take 1 g by mouth.   vortioxetine HBr 20 MG Tabs tablet Commonly known as:  TRINTELLIX Take 20 mg by mouth daily.       Allergies:  Allergies  Allergen Reactions  . Aspartame Other (See Comments)    Shaking  Shaking  Shaking   . Ciprofloxacin Other (See Comments) and Nausea And Vomiting    pain  . Fish Oil Cough  . Losartan Cough  . Mesalamine Other (See Comments)    Seen things Seen things Seen things  . Metronidazole Other (See Comments) and Nausea And Vomiting    pain  . Rofecoxib Other (See Comments)    Hallucinations  Other reaction(s): Psychosis (intolerance) Hallucinations Hallucinations  . Clarithromycin Rash  . Codeine Nausea And Vomiting and Nausea Only  . Sulfa Antibiotics Rash    fever  fever  . Sulfasalazine Rash    fever  . Sulfonamide Derivatives Rash    fever    Past Medical History, Surgical history, Social history, and Family History were reviewed and updated.  Review of Systems: All other 10 point review of systems is negative.   Physical Exam:  vitals were not taken for this visit.   Wt Readings from Last 3 Encounters:  11/30/17 158 lb (71.7 kg)  11/02/17 160 lb (72.6 kg)  08/05/17 165 lb (74.8 kg)    Ocular: Sclerae unicteric, pupils equal, round and reactive to light Ear-nose-throat: Oropharynx clear, dentition fair Lymphatic: No cervical, supraclavicular or axillary adenopathy Lungs no rales or rhonchi, good excursion bilaterally Heart regular rate and rhythm, no murmur appreciated Abd soft, nontender, positive bowel sounds, no liver or spleen tip palpated on exam, no fluid wave  MSK no focal spinal tenderness, no joint edema Neuro: non-focal, well-oriented, appropriate  affect Breasts: Deferred   Lab Results  Component Value Date   WBC 7.8 01/11/2018   HGB 11.1 (L) 01/11/2018   HCT 33.4 (L) 01/11/2018   MCV 88.6 01/11/2018   PLT 203 01/11/2018   Lab Results  Component Value Date   FERRITIN 366 (H) 11/30/2017   IRON 102 11/30/2017   TIBC 384 11/30/2017   UIBC 282 11/30/2017   IRONPCTSAT 27 11/30/2017   Lab Results  Component Value Date   RETICCTPCT 0.9 11/02/2017   RBC 3.77 01/11/2018   No results found for: KPAFRELGTCHN, LAMBDASER, KAPLAMBRATIO No results found for: IGGSERUM, IGA, IGMSERUM No results found for: TOTALPROTELP, ALBUMINELP, A1GS, A2GS, BETS, BETA2SER, [place], [place], SPEI   Chemistry  Component Value Date/Time   NA 143 11/30/2017 1308   K 4.1 11/30/2017 1308   CL 107 11/30/2017 1308   CO2 28 11/30/2017 1308   BUN 12 11/30/2017 1308   CREATININE 0.70 11/30/2017 1308      Component Value Date/Time   CALCIUM 10.2 11/30/2017 1308   CALCIUM 9.7 04/08/2011 1053   ALKPHOS 114 (H) 11/30/2017 1308   AST 30 11/30/2017 1308   ALT 22 11/30/2017 1308   BILITOT 1.2 11/30/2017 1308      Impression and Plan: Ms. Edwards is a very pleasant 57 yo caucasian female with iron deficiency anemia secondary to autoimmune gastritis.  She is symptomatic at this time with fatigued. Hgb is 11.1 with an MCV of 88.  We will see what her iron studies and vitamin D show and replace as needed.  We will go ahead and plan to see her back in another 2 months for follow-up.  She will contact our office with any questions or concerns. We can certainly see her sooner if need be.   Laverna Peace, NP 5/13/20192:03 PM

## 2018-01-12 ENCOUNTER — Telehealth: Payer: Self-pay | Admitting: Family

## 2018-01-12 ENCOUNTER — Other Ambulatory Visit: Payer: Self-pay | Admitting: Family

## 2018-01-12 DIAGNOSIS — E559 Vitamin D deficiency, unspecified: Secondary | ICD-10-CM

## 2018-01-12 LAB — VITAMIN D 25 HYDROXY (VIT D DEFICIENCY, FRACTURES): Vit D, 25-Hydroxy: 16.9 ng/mL — ABNORMAL LOW (ref 30.0–100.0)

## 2018-01-12 LAB — FERRITIN: FERRITIN: 111 ng/mL (ref 9–269)

## 2018-01-12 LAB — IRON AND TIBC
IRON: 56 ug/dL (ref 41–142)
Saturation Ratios: 15 % — ABNORMAL LOW (ref 21–57)
TIBC: 383 ug/dL (ref 236–444)
UIBC: 327 ug/dL

## 2018-01-12 MED ORDER — ERGOCALCIFEROL 1.25 MG (50000 UT) PO CAPS: 50000.0000 [IU] | ORAL_CAPSULE | ORAL | 6 refills | Status: DC

## 2018-01-12 NOTE — Telephone Encounter (Signed)
I spoke with Rhonda Gutierrez and let her know her iron saturation and vitamin D level are both low. We will get her on Vit D 50,000 units weekly and bring her in for 1 dose of IV iron. Scheduling message sent to vonte.

## 2018-01-21 ENCOUNTER — Inpatient Hospital Stay: Payer: BLUE CROSS/BLUE SHIELD

## 2018-01-21 ENCOUNTER — Other Ambulatory Visit: Payer: Self-pay

## 2018-01-21 VITALS — BP 100/74 | HR 80 | Temp 98.6°F | Resp 18

## 2018-01-21 DIAGNOSIS — D508 Other iron deficiency anemias: Secondary | ICD-10-CM

## 2018-01-21 DIAGNOSIS — D509 Iron deficiency anemia, unspecified: Secondary | ICD-10-CM | POA: Diagnosis not present

## 2018-01-21 MED ORDER — SODIUM CHLORIDE 0.9 % IV SOLN
510.0000 mg | Freq: Once | INTRAVENOUS | Status: AC
  Administered 2018-01-21: 510 mg via INTRAVENOUS
  Filled 2018-01-21: qty 17

## 2018-01-21 MED ORDER — SODIUM CHLORIDE 0.9 % IV SOLN
INTRAVENOUS | Status: DC
  Administered 2018-01-21: 14:00:00 via INTRAVENOUS

## 2018-01-21 NOTE — Patient Instructions (Signed)

## 2018-01-26 NOTE — Progress Notes (Signed)
Office Visit Note  Patient: Rhonda Gutierrez             Date of Birth: July 09, 1961           MRN: [id number]             PCP: Hulan Fess, MD Referring: Hulan Fess, MD Visit Date: 02/03/2018 Occupation: @GUAROCC @    Subjective:  No chief complaint on file.   History of Present Illness: Rhonda Gutierrez is a 57 y.o. female history of osteoarthritis, DDD, myofascial pain, ulcerative colitis.  According to patient in January she had endoscopy to evaluate chronic anemia.  She states the endoscopy was normal.  Proximately 5 days after the endoscopy she started having nausea and vomiting.  She was also having abdominal discomfort.  She had CT scan with contrast which was negative.  She followed up with the gastroenterologist.  She states she had some weight loss during this time.  She also had gastric emptying test in February which was unremarkable.  On further testing according to patient she was negative for H. pylori and celiac disease work-up.  She still has intermittent nausea and vomiting.  Patient was diagnosed with pernicious anemia and chronic anemia.  She has had 3 infusions of iron.  She continues to have some stiffness in her hands.  She does not have much discomfort.  She has noticed some knots on her fingers.  He continues to have knee joint discomfort.  She continues to have some generalized pain but is manageable.  She states the gabapentin has been helpful for plantar fasciitis.  Activities of Daily Living:  Patient reports morning stiffness for 2 minute.   Patient Denies nocturnal pain.  Difficulty dressing/grooming: Denies Difficulty climbing stairs: Reports Difficulty getting out of chair: Denies Difficulty using hands for taps, buttons, cutlery, and/or writing: Denies   Review of Systems  Constitutional: Positive for fatigue. Negative for night sweats, weight gain and weight loss.  HENT: Negative for mouth sores, trouble swallowing, trouble swallowing, mouth  dryness and nose dryness.   Eyes: Negative for pain, redness, visual disturbance and dryness.  Respiratory: Negative for cough, shortness of breath and difficulty breathing.   Cardiovascular: Negative for chest pain, palpitations, hypertension, irregular heartbeat and swelling in legs/feet.  Gastrointestinal: Positive for abdominal pain, nausea and vomiting. Negative for blood in stool, constipation and diarrhea.  Endocrine: Negative for increased urination.  Genitourinary: Negative for vaginal dryness.  Musculoskeletal: Positive for morning stiffness. Negative for arthralgias, joint pain, joint swelling, myalgias, muscle weakness, muscle tenderness and myalgias.  Skin: Positive for hair loss. Negative for color change, rash, skin tightness, ulcers and sensitivity to sunlight.  Allergic/Immunologic: Negative for susceptible to infections.  Neurological: Negative for dizziness, memory loss, night sweats and weakness.  Hematological: Negative for swollen glands.  Psychiatric/Behavioral: Positive for depressed mood. Negative for sleep disturbance. The patient is nervous/anxious.     PMFS History:  Patient Active Problem List   Diagnosis Date Noted  . IDA (iron deficiency anemia) 11/05/2017  . Primary osteoarthritis of both hands 07/10/2017  . Primary osteoarthritis of both knees 07/10/2017  . Plantar fasciitis 07/10/2017  . DDD (degenerative disc disease), cervical 07/10/2017  . Vitamin D deficiency 07/10/2017  . Abnormal laboratory test 07/10/2017  . Gastroenteritis presumed infectious 08/12/2015  . Nausea vomiting and diarrhea 07/30/2015  . ARF (acute renal failure) ([place]) 07/30/2015  . Diarrhea 07/30/2015  . Acute kidney injury ([place]) 07/29/2015  . Pain in joint, ankle and foot 05/03/2013  .  Type I (juvenile type) diabetes mellitus with renal manifestations, not stated as uncontrolled(250.41) 01/11/2013  . Carpal tunnel syndrome, bilateral 10/07/2011  . Cough 07/09/2010  .  PROTEINURIA, MILD 04/10/2009  . DISTURBANCE OF SKIN SENSATION 01/02/2009  . HYPERLIPIDEMIA 11/21/2008  . RENAL INSUFFICIENCY 11/21/2008  . Ulcerative colitis ([place]) 03/06/2008  . FATTY LIVER DISEASE 03/06/2008  . Essential hypertension 04/14/2007    Past Medical History:  Diagnosis Date  . Dyslipidemia   . Fatty liver disease, nonalcoholic   . Type I (juvenile type) diabetes mellitus without mention of complication, not stated as uncontrolled   . Ulcerative colitis, unspecified   . Unspecified essential hypertension     Family History  Problem Relation Age of Onset  . Cancer Father   . Allergy (severe) Daughter    Past Surgical History:  Procedure Laterality Date  . APPENDECTOMY    . CARPAL TUNNEL RELEASE    . CHOLECYSTECTOMY  1993  . ILEOSTOMY  06/17/2006   take down  . Insulin pump    . mass removal      right arm, benign  . NASAL SINUS SURGERY  02/10/1994  . PROTOCOLECTOMY  12/08/2005   total  . stoma  2007   reversal of ileostomy  . 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 She believes that when she returns to work with much more activity, her glucoses will come down some more.     Objective: Vital Signs: BP 120/75 (BP Location: Left Arm, Patient Position: Sitting, Cuff Size: Normal)   Pulse 81   Resp 14   Ht 5' 2"  (1.575 m)   Wt 157 lb 8 oz (71.4 kg)   BMI 28.81 kg/m    Physical Exam  Constitutional: She is oriented to person, place, and time. She appears well-developed and well-nourished.  HENT:  Head: Normocephalic and atraumatic.  Eyes: Conjunctivae and EOM are normal.  Neck: Normal range of motion.  Cardiovascular: Normal rate, regular rhythm, normal heart sounds and intact distal pulses.  Pulmonary/Chest: Effort normal and breath sounds normal.  Abdominal: Soft. Bowel sounds are normal.  Lymphadenopathy:    She has no  cervical adenopathy.  Neurological: She is alert and oriented to person, place, and time.  Skin: Skin is warm and dry. Capillary refill takes less than 2 seconds.  Psychiatric: She has a normal mood and affect. Her behavior is normal.  Nursing note and vitals reviewed.    Musculoskeletal Exam: C-spine limited range of motion.  She has thoracic kyphosis.  Lumbar spine good range of motion.  Shoulder joints elbow joints were in good range of motion.  She has DIP PIP thickening consistent with osteoarthritis.  Hip joints were in good range of motion.  She has crepitus with range of motion of bilateral knee joints without any warmth swelling or effusion.  She has generalized hyperalgesia and few tender points.   CDAI Exam: No CDAI exam completed.    Investigation: No additional findings. CBC Latest Ref Rng & Units 01/11/2018 11/30/2017 11/02/2017  WBC 3.9 - 10.0 K/uL 7.8 7.2 6.5  Hemoglobin 11.6 - 15.9 g/dL 11.1(L) 11.6 9.9(L)  Hematocrit 34.8 - 46.6 % 33.4(L) 35.8 32.1(L)  Platelets 145 - 400 K/uL 203 237 223   CMP Latest Ref Rng & Units 01/11/2018 11/30/2017 11/02/2017  Glucose 70 - 140 mg/dL 201(H) 121(H) 281(H)  BUN  7 - 26 mg/dL 15 12 14   Creatinine 0.60 - 1.10 mg/dL 0.85 0.70 0.91  Sodium 136 - 145 mmol/L 140 143 139  Potassium 3.5 - 5.1 mmol/L 4.6 4.1 4.1  Chloride 98 - 109 mmol/L 106 107 105  CO2 22 - 29 mmol/L 24 28 22   Calcium 8.4 - 10.4 mg/dL 9.7 10.2 9.8  Total Protein 6.4 - 8.3 g/dL 7.7 8.2(H) 7.6  Total Bilirubin 0.2 - 1.2 mg/dL 0.8 1.2 0.5  Alkaline Phos 40 - 150 U/L 113 114(H) 113  AST 5 - 34 U/L 20 30 21   ALT 0 - 55 U/L 11 22 13     Imaging: No results found.  Speciality Comments: No specialty comments available.    Procedures:  No procedures performed Allergies: Aspartame; Fish oil; Losartan; Mesalamine; Rofecoxib; Clarithromycin; Codeine; Sulfa antibiotics; Sulfasalazine; and Sulfonamide derivatives   Assessment / Plan:     Visit Diagnoses: Polyarthralgia -  Positive RF, negative anti-CCP.  Patient has no synovitis on examination.  Primary osteoarthritis of both hands-joint protection muscle strengthening was discussed.  Exercise was given.  Primary osteoarthritis of both knees - Mild OA, mild chondromalacia patella..  Exercises.  Weight loss diet and exercise was discussed.  S/p bilateral carpal tunnel release  DDD (degenerative disc disease), cervical-some C-spine discomfort.  Myofascial pain-exercises will be helpful.  Plantar fasciitis-she is doing better on gabapentin.  Other ulcerative colitis without complication ([place]) - Status post ileostomy and proctectomy 2007.  She has chronic diarrhea because of that.  History of vitamin D deficiency-she is on vitamin D supplement.  History of anemia-patient has had 3 iron infusions and her hemoglobin is better.  History of colectomy-this.  History of gastroesophageal reflux (GERD) - Followed up by Dr[facility] Renee Harder at [facility].  Patient continues to have nausea and vomiting.  History of hypertension  History of fatty infiltration of liver  History of chronic kidney disease/ Dr Brayton El manages   History of anxiety  History of diabetes mellitus - type1  History of hyperlipidemia    Orders: No orders of the defined types were placed in this encounter.  No orders of the defined types were placed in this encounter.   Face-to-face time spent with patient was 30 minutes.> 50% of time was spent in counseling and coordination of care.  Follow-Up Instructions: Return in about 6 months (around 08/05/2018) for Osteoarthritis, DDD, MFPS.   Bo Merino, MD  Note - This record has been created using Editor, commissioning.  Chart creation errors have been sought, but may not always  have been located. Such creation errors do not reflect on  the standard of medical care.

## 2018-02-03 ENCOUNTER — Encounter: Payer: Self-pay | Admitting: Rheumatology

## 2018-02-03 ENCOUNTER — Ambulatory Visit (INDEPENDENT_AMBULATORY_CARE_PROVIDER_SITE_OTHER): Payer: BLUE CROSS/BLUE SHIELD | Admitting: Rheumatology

## 2018-02-03 VITALS — BP 120/75 | HR 81 | Resp 14 | Ht 62.0 in | Wt 157.5 lb

## 2018-02-03 DIAGNOSIS — M722 Plantar fascial fibromatosis: Secondary | ICD-10-CM | POA: Diagnosis not present

## 2018-02-03 DIAGNOSIS — Z862 Personal history of diseases of the blood and blood-forming organs and certain disorders involving the immune mechanism: Secondary | ICD-10-CM

## 2018-02-03 DIAGNOSIS — Z9889 Other specified postprocedural states: Secondary | ICD-10-CM | POA: Diagnosis not present

## 2018-02-03 DIAGNOSIS — Z8639 Personal history of other endocrine, nutritional and metabolic disease: Secondary | ICD-10-CM | POA: Diagnosis not present

## 2018-02-03 DIAGNOSIS — M170 Bilateral primary osteoarthritis of knee: Secondary | ICD-10-CM

## 2018-02-03 DIAGNOSIS — Z9049 Acquired absence of other specified parts of digestive tract: Secondary | ICD-10-CM | POA: Diagnosis not present

## 2018-02-03 DIAGNOSIS — Z87448 Personal history of other diseases of urinary system: Secondary | ICD-10-CM

## 2018-02-03 DIAGNOSIS — K5180 Other ulcerative colitis without complications: Secondary | ICD-10-CM | POA: Diagnosis not present

## 2018-02-03 DIAGNOSIS — Z8719 Personal history of other diseases of the digestive system: Secondary | ICD-10-CM

## 2018-02-03 DIAGNOSIS — M19041 Primary osteoarthritis, right hand: Secondary | ICD-10-CM | POA: Diagnosis not present

## 2018-02-03 DIAGNOSIS — M5030 Other cervical disc degeneration, unspecified cervical region: Secondary | ICD-10-CM | POA: Diagnosis not present

## 2018-02-03 DIAGNOSIS — M7918 Myalgia, other site: Secondary | ICD-10-CM

## 2018-02-03 DIAGNOSIS — Z8679 Personal history of other diseases of the circulatory system: Secondary | ICD-10-CM

## 2018-02-03 DIAGNOSIS — M19042 Primary osteoarthritis, left hand: Secondary | ICD-10-CM

## 2018-02-03 DIAGNOSIS — M2550 Pain in unspecified joint: Secondary | ICD-10-CM

## 2018-02-03 DIAGNOSIS — Z8659 Personal history of other mental and behavioral disorders: Secondary | ICD-10-CM

## 2018-02-03 NOTE — Patient Instructions (Signed)
Hand Exercises Hand exercises can be helpful to almost anyone. These exercises can strengthen the hands, improve flexibility and movement, and increase blood flow to the hands. These results can make work and daily tasks easier. Hand exercises can be especially helpful for people who have joint pain from arthritis or have nerve damage from overuse (carpal tunnel syndrome). These exercises can also help people who have injured a hand. Most of these hand exercises are fairly gentle stretching routines. You can do them often throughout the day. Still, it is a good idea to ask your health care provider which exercises would be best for you. Warming your hands before exercise may help to reduce stiffness. You can do this with gentle massage or by placing your hands in warm water for 15 minutes. Also, make sure you pay attention to your level of hand pain as you begin an exercise routine. Exercises Knuckle Bend Repeat this exercise 5-10 times with each hand. 1. Stand or sit with your arm, hand, and all five fingers pointed straight up. Make sure your wrist is straight. 2. Gently and slowly bend your fingers down and inward until the tips of your fingers are touching the tops of your palm. 3. Hold this position for a few seconds. 4. Extend your fingers out to their original position, all pointing straight up again.  Finger Fan Repeat this exercise 5-10 times with each hand. 1. Hold your arm and hand out in front of you. Keep your wrist straight. 2. Squeeze your hand into a fist. 3. Hold this position for a few seconds. 4. Edison Simon out, or spread apart, your hand and fingers as much as possible, stretching every joint fully.  Tabletop Repeat this exercise 5-10 times with each hand. 1. Stand or sit with your arm, hand, and all five fingers pointed straight up. Make sure your wrist is straight. 2. Gently and slowly bend your fingers at the knuckles where they meet the hand until your hand is making an  upside-down L shape. Your fingers should form a tabletop. 3. Hold this position for a few seconds. 4. Extend your fingers out to their original position, all pointing straight up again.  Making Os Repeat this exercise 5-10 times with each hand. 1. Stand or sit with your arm, hand, and all five fingers pointed straight up. Make sure your wrist is straight. 2. Make an O shape by touching your pointer finger to your thumb. Hold for a few seconds. Then open your hand wide. 3. Repeat this motion with each finger on your hand.  Table Spread Repeat this exercise 5-10 times with each hand. 1. Place your hand on a table with your palm facing down. Make sure your wrist is straight. 2. Spread your fingers out as much as possible. Hold this position for a few seconds. 3. Slide your fingers back together again. Hold for a few seconds.  Ball Grip  Repeat this exercise 10-15 times with each hand. 1. Hold a tennis ball or another soft ball in your hand. 2. While slowly increasing pressure, squeeze the ball as hard as possible. 3. Squeeze as hard as you can for 3-5 seconds. 4. Relax and repeat.  Wrist Curls Repeat this exercise 10-15 times with each hand. 1. Sit in a chair that has armrests. 2. Hold a light weight in your hand, such as a dumbbell that weighs 1-3 pounds (0.5-1.4 kg). Ask your health care provider what weight would be best for you. 3. Rest your hand just over  the end of the chair arm with your palm facing up. 4. Gently pivot your wrist up and down while holding the weight. Do not twist your wrist from side to side.  Contact a health care provider if:  Your hand pain or discomfort gets much worse when you do an exercise.  Your hand pain or discomfort does not improve within 2 hours after you exercise. If you have any of these problems, stop doing these exercises right away. Do not do them again unless your health care provider says that you can. Get help right away if:  You  develop sudden, severe hand pain. If this happens, stop doing these exercises right away. Do not do them again unless your health care provider says that you can. This information is not intended to replace advice given to you by your health care provider. Make sure you discuss any questions you have with your health care provider. Document Released: 07/30/2015 Document Revised: 01/24/2016 Document Reviewed: 02/26/2015 Elsevier Interactive Patient Education  2018 [employer]. Knee Exercises Ask your health care provider which exercises are safe for you. Do exercises exactly as told by your health care provider and adjust them as directed. It is normal to feel mild stretching, pulling, tightness, or discomfort as you do these exercises, but you should stop right away if you feel sudden pain or your pain gets worse.Do not begin these exercises until told by your health care provider. STRETCHING AND RANGE OF MOTION EXERCISES These exercises warm up your muscles and joints and improve the movement and flexibility of your knee. These exercises also help to relieve pain, numbness, and tingling. Exercise A: Knee Extension, Prone 1. Lie on your abdomen on a bed. 2. Place your left / right knee just beyond the edge of the surface so your knee is not on the bed. You can put a towel under your left / right thigh just above your knee for comfort. 3. Relax your leg muscles and allow gravity to straighten your knee. You should feel a stretch behind your left / right knee. 4. Hold this position for __________ seconds. 5. Scoot up so your knee is supported between repetitions. Repeat __________ times. Complete this stretch __________ times a day. Exercise B: Knee Flexion, Active  1. Lie on your back with both knees straight. If this causes back discomfort, bend your left / right knee so your foot is flat on the floor. 2. Slowly slide your left / right heel back toward your buttocks until you feel a gentle  stretch in the front of your knee or thigh. 3. Hold this position for __________ seconds. 4. Slowly slide your left / right heel back to the starting position. Repeat __________ times. Complete this exercise __________ times a day. Exercise C: Quadriceps, Prone  1. Lie on your abdomen on a firm surface, such as a bed or padded floor. 2. Bend your left / right knee and hold your ankle. If you cannot reach your ankle or pant leg, loop a belt around your foot and grab the belt instead. 3. Gently pull your heel toward your buttocks. Your knee should not slide out to the side. You should feel a stretch in the front of your thigh and knee. 4. Hold this position for __________ seconds. Repeat __________ times. Complete this stretch __________ times a day. Exercise D: Hamstring, Supine 1. Lie on your back. 2. Loop a belt or towel over the ball of your left / right foot. The ball of  your foot is on the walking surface, right under your toes. 3. Straighten your left / right knee and slowly pull on the belt to raise your leg until you feel a gentle stretch behind your knee. ? Do not let your left / right knee bend while you do this. ? Keep your other leg flat on the floor. 4. Hold this position for __________ seconds. Repeat __________ times. Complete this stretch __________ times a day. STRENGTHENING EXERCISES These exercises build strength and endurance in your knee. Endurance is the ability to use your muscles for a long time, even after they get tired. Exercise E: Quadriceps, Isometric  1. Lie on your back with your left / right leg extended and your other knee bent. Put a rolled towel or small pillow under your knee if told by your health care provider. 2. Slowly tense the muscles in the front of your left / right thigh. You should see your kneecap slide up toward your hip or see increased dimpling just above the knee. This motion will push the back of the knee toward the floor. 3. For __________  seconds, keep the muscle as tight as you can without increasing your pain. 4. Relax the muscles slowly and completely. Repeat __________ times. Complete this exercise __________ times a day. Exercise F: Straight Leg Raises - Quadriceps 1. Lie on your back with your left / right leg extended and your other knee bent. 2. Tense the muscles in the front of your left / right thigh. You should see your kneecap slide up or see increased dimpling just above the knee. Your thigh may even shake a bit. 3. Keep these muscles tight as you raise your leg 4-6 inches (10-15 cm) off the floor. Do not let your knee bend. 4. Hold this position for __________ seconds. 5. Keep these muscles tense as you lower your leg. 6. Relax your muscles slowly and completely after each repetition. Repeat __________ times. Complete this exercise __________ times a day. Exercise G: Hamstring, Isometric 1. Lie on your back on a firm surface. 2. Bend your left / right knee approximately __________ degrees. 3. Dig your left / right heel into the surface as if you are trying to pull it toward your buttocks. Tighten the muscles in the back of your thighs to dig as hard as you can without increasing any pain. 4. Hold this position for __________ seconds. 5. Release the tension gradually and allow your muscles to relax completely for __________ seconds after each repetition. Repeat __________ times. Complete this exercise __________ times a day. Exercise H: Hamstring Curls  If told by your health care provider, do this exercise while wearing ankle weights. Begin with __________ weights. Then increase the weight by 1 lb (0.5 kg) increments. Do not wear ankle weights that are more than __________. 1. Lie on your abdomen with your legs straight. 2. Bend your left / right knee as far as you can without feeling pain. Keep your hips flat against the floor. 3. Hold this position for __________ seconds. 4. Slowly lower your leg to the  starting position.  Repeat __________ times. Complete this exercise __________ times a day. Exercise I: Squats (Quadriceps) 1. Stand in front of a table, with your feet and knees pointing straight ahead. You may rest your hands on the table for balance but not for support. 2. Slowly bend your knees and lower your hips like you are going to sit in a chair. ? Keep your weight over your heels,  not over your toes. ? Keep your lower legs upright so they are parallel with the table legs. ? Do not let your hips go lower than your knees. ? Do not bend lower than told by your health care provider. ? If your knee pain increases, do not bend as low. 3. Hold the squat position for __________ seconds. 4. Slowly push with your legs to return to standing. Do not use your hands to pull yourself to standing. Repeat __________ times. Complete this exercise __________ times a day. Exercise J: Wall Slides (Quadriceps)  1. Lean your back against a smooth wall or door while you walk your feet out 18-24 inches (46-61 cm) from it. 2. Place your feet hip-width apart. 3. Slowly slide down the wall or door until your knees bend __________ degrees. Keep your knees over your heels, not over your toes. Keep your knees in line with your hips. 4. Hold for __________ seconds. Repeat __________ times. Complete this exercise __________ times a day. Exercise K: Straight Leg Raises - Hip Abductors 1. Lie on your side with your left / right leg in the top position. Lie so your head, shoulder, knee, and hip line up. You may bend your bottom knee to help you keep your balance. 2. Roll your hips slightly forward so your hips are stacked directly over each other and your left / right knee is facing forward. 3. Leading with your heel, lift your top leg 4-6 inches (10-15 cm). You should feel the muscles in your outer hip lifting. ? Do not let your foot drift forward. ? Do not let your knee roll toward the ceiling. 4. Hold this  position for __________ seconds. 5. Slowly return your leg to the starting position. 6. Let your muscles relax completely after each repetition. Repeat __________ times. Complete this exercise __________ times a day. Exercise L: Straight Leg Raises - Hip Extensors 1. Lie on your abdomen on a firm surface. You can put a pillow under your hips if that is more comfortable. 2. Tense the muscles in your buttocks and lift your left / right leg about 4-6 inches (10-15 cm). Keep your knee straight as you lift your leg. 3. Hold this position for __________ seconds. 4. Slowly lower your leg to the starting position. 5. Let your leg relax completely after each repetition. Repeat __________ times. Complete this exercise __________ times a day. This information is not intended to replace advice given to you by your health care provider. Make sure you discuss any questions you have with your health care provider. Document Released: 07/02/2005 Document Revised: 05/12/2016 Document Reviewed: 06/24/2015 Elsevier Interactive Patient Education  2018 [employer].

## 2018-02-10 ENCOUNTER — Other Ambulatory Visit: Payer: Self-pay | Admitting: Family

## 2018-03-08 ENCOUNTER — Other Ambulatory Visit: Payer: Self-pay

## 2018-03-08 ENCOUNTER — Inpatient Hospital Stay: Payer: BLUE CROSS/BLUE SHIELD | Attending: Family | Admitting: Family

## 2018-03-08 ENCOUNTER — Inpatient Hospital Stay: Payer: BLUE CROSS/BLUE SHIELD

## 2018-03-08 ENCOUNTER — Encounter: Payer: Self-pay | Admitting: Family

## 2018-03-08 VITALS — BP 125/77 | HR 79 | Temp 98.5°F | Resp 16 | Wt 158.0 lb

## 2018-03-08 DIAGNOSIS — K51012 Ulcerative (chronic) pancolitis with intestinal obstruction: Secondary | ICD-10-CM

## 2018-03-08 DIAGNOSIS — D508 Other iron deficiency anemias: Secondary | ICD-10-CM | POA: Diagnosis not present

## 2018-03-08 DIAGNOSIS — E559 Vitamin D deficiency, unspecified: Secondary | ICD-10-CM | POA: Diagnosis not present

## 2018-03-08 DIAGNOSIS — D631 Anemia in chronic kidney disease: Secondary | ICD-10-CM | POA: Diagnosis present

## 2018-03-08 DIAGNOSIS — N182 Chronic kidney disease, stage 2 (mild): Secondary | ICD-10-CM | POA: Diagnosis present

## 2018-03-08 DIAGNOSIS — D509 Iron deficiency anemia, unspecified: Secondary | ICD-10-CM | POA: Insufficient documentation

## 2018-03-08 LAB — CBC WITH DIFFERENTIAL (CANCER CENTER ONLY)
Basophils Absolute: 0 10*3/uL (ref 0.0–0.1)
Basophils Relative: 1 %
EOS ABS: 0.2 10*3/uL (ref 0.0–0.5)
EOS PCT: 3 %
HCT: 33.3 % — ABNORMAL LOW (ref 34.8–46.6)
HEMOGLOBIN: 11.2 g/dL — AB (ref 11.6–15.9)
LYMPHS ABS: 1.9 10*3/uL (ref 0.9–3.3)
Lymphocytes Relative: 24 %
MCH: 30.9 pg (ref 26.0–34.0)
MCHC: 33.6 g/dL (ref 32.0–36.0)
MCV: 91.7 fL (ref 81.0–101.0)
MONO ABS: 0.3 10*3/uL (ref 0.1–0.9)
MONOS PCT: 4 %
NEUTROS PCT: 68 %
Neutro Abs: 5.5 10*3/uL (ref 1.5–6.5)
Platelet Count: 237 10*3/uL (ref 145–400)
RBC: 3.63 MIL/uL — ABNORMAL LOW (ref 3.70–5.32)
RDW: 12.5 % (ref 11.1–15.7)
WBC Count: 8 10*3/uL (ref 3.9–10.0)

## 2018-03-08 LAB — CMP (CANCER CENTER ONLY)
ALK PHOS: 118 U/L (ref 38–126)
ALT: 19 U/L (ref 0–44)
ANION GAP: 9 (ref 5–15)
AST: 28 U/L (ref 15–41)
Albumin: 4.4 g/dL (ref 3.5–5.0)
BUN: 14 mg/dL (ref 6–20)
CALCIUM: 10.3 mg/dL (ref 8.9–10.3)
CO2: 25 mmol/L (ref 22–32)
Chloride: 105 mmol/L (ref 98–111)
Creatinine: 0.93 mg/dL (ref 0.44–1.00)
GFR, Estimated: >60 mL/min (ref >60)
Glucose, Bld: 201 mg/dL — ABNORMAL HIGH (ref 70–99)
Potassium: 3.9 mmol/L (ref 3.5–5.1)
SODIUM: 139 mmol/L (ref 135–145)
TOTAL PROTEIN: 7.9 g/dL (ref 6.5–8.1)
Total Bilirubin: 0.7 mg/dL (ref 0.3–1.2)

## 2018-03-08 LAB — MAGNESIUM: Magnesium: 1.8 mg/dL (ref 1.7–2.4)

## 2018-03-08 NOTE — Progress Notes (Signed)
Hematology and Oncology Follow Up Visit  Rhonda Gutierrez [id number] 05-Nov-1960 57 y.o. 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 Rhonda Gutierrez is here today for follow-up. She is still symptomatic with fatigue, feeling off balance at times and "brain fog".  She has had no noted blood loss, no bruising or petechiae.  No falls or syncopal episodes. No lymphadenopathy noted on exam.  No fever, chills, n/v, cough, rash, SOB, chest pain, palpitations, abdominal pain or changes in bowel or bladder habits.  No swelling, tenderness, numbness or tingling in her extremities at this time. She has chronic aches and pains due to fibromyalgia and osteoarthritis.  She sees her podiatrist Wednesday for the pain in her right heel.  She has a good appetite and stays well hydrated. Her blood sugars are still not well controlled and she is followed by endocrinology every 12 weeks. Her weight is stable.   ECOG Performance Status: 1 - Symptomatic but completely ambulatory  Medications:  Allergies as of 03/08/2018      Reactions   Aspartame Other (See Comments)   Shaking  Shaking  Shaking    Fish Oil Cough   Losartan Cough   Mesalamine Other (See Comments)   Seen things Seen things Seen things   Rofecoxib Other (See Comments)   Hallucinations Other reaction(s): Psychosis (intolerance) Hallucinations Hallucinations   Clarithromycin Rash   Codeine Nausea And Vomiting, Nausea Only   Sulfa Antibiotics Rash   fever fever   Sulfasalazine Rash   fever   Sulfonamide Derivatives Rash   fever      Medication List        Accurate as of 03/08/18  2:35 PM. Always use your most recent med list.          ALPRAZolam 0.25 MG tablet Commonly known as:  XANAX as needed.   aspirin 81 MG tablet Take 81 mg by mouth daily.     atorvastatin 80 MG tablet Commonly known as:  LIPITOR Take 80 mg by mouth daily.   cloNIDine 0.1 MG tablet Commonly known as:  CATAPRES Take 0.1 mg by mouth at bedtime.   Continuous Glucose Monitor Devi Inject 1 Device into the skin once a week.   cyanocobalamin 1000 MCG/ML injection Commonly known as:  (VITAMIN B-12) Inject into the muscle every 30 (thirty) days.   ergocalciferol 50000 units capsule Commonly known as:  VITAMIN D2 Take 1 capsule (50,000 Units total) by mouth once a week.   esomeprazole 40 MG capsule Commonly known as:  NEXIUM Take 40 mg by mouth daily. At bedtime.   famotidine 20 MG tablet Commonly known as:  PEPCID Take 20 mg by mouth 2 (two) times daily.   fenofibrate 160 MG tablet Take 160 mg by mouth daily.   fluticasone 50 MCG/ACT nasal spray Commonly known as:  FLONASE Place into both nostrils daily.   folic acid 1 MG tablet Commonly known as:  FOLVITE Take 1 mg by mouth daily.   gabapentin 600 MG tablet Commonly known as:  NEURONTIN Take 600 mg by mouth 3 (three) times daily.   GLUCAGEN HYPOKIT 1 MG Solr injection Generic drug:  glucagon INJECT 1 MG INTO THE MUSCLE ONCE AS DIRECTED   insulin lispro 100 UNIT/ML KiwkPen Commonly known as:  HUMALOG KWIKPEN Use as directed if pump does not work  HUMALOG 100 UNIT/ML injection Generic drug:  insulin lispro USE IN INSULIN PUMP FOR A TOTAL OF 230 UNITS PER DAY   loperamide 2 MG capsule Commonly known as:  IMODIUM Take 8 mg by mouth 2 (two) times daily.   loratadine 10 MG tablet Commonly known as:  CLARITIN Take 10 mg by mouth daily.   medroxyPROGESTERone 150 MG/ML injection Commonly known as:  DEPO-PROVERA Inject 150 mg into the muscle every 3 (three) months.   ondansetron 8 MG tablet Commonly known as:  ZOFRAN Take 1 tablet (8 mg total) by mouth every 8 (eight) hours as needed for nausea or vomiting.   PARADIGM RESERVOIR 3ML Misc 1 Device by Does not apply route every 3  (three) days.   PARADIGM QUICK-SET 32" 9MM Misc 1 Device by Does not apply route every 3 (three) days.   ramipril 2.5 MG capsule Commonly known as:  ALTACE Take 2.5 mg by mouth 2 (two) times daily. Further refills through PCP.   tretinoin 0.05 % cream Commonly known as:  RETIN-A APPLY ONE APPLICATION EVERY NIGHT AT BEDTIME.   vortioxetine HBr 20 MG Tabs tablet Commonly known as:  TRINTELLIX Take 20 mg by mouth daily.       Allergies:  Allergies  Allergen Reactions  . Aspartame Other (See Comments)    Shaking  Shaking  Shaking   . Fish Oil Cough  . Losartan Cough  . Mesalamine Other (See Comments)    Seen things Seen things Seen things  . Rofecoxib Other (See Comments)    Hallucinations  Other reaction(s): Psychosis (intolerance) Hallucinations Hallucinations  . Clarithromycin Rash  . Codeine Nausea And Vomiting and Nausea Only  . Sulfa Antibiotics Rash    fever  fever  . Sulfasalazine Rash    fever  . Sulfonamide Derivatives Rash    fever    Past Medical History, Surgical history, Social history, and Family History were reviewed and updated.  Review of Systems: All other 10 point review of systems is negative.   Physical Exam:  weight is 158 lb (71.7 kg). Her oral temperature is 98.5 F (36.9 C). Her blood pressure is 125/77 and her pulse is 79. Her respiration is 16 and oxygen saturation is 100%.   Wt Readings from Last 3 Encounters:  03/08/18 158 lb (71.7 kg)  02/03/18 157 lb 8 oz (71.4 kg)  01/11/18 159 lb (72.1 kg)    Ocular: Sclerae unicteric, pupils equal, round and reactive to light Ear-nose-throat: Oropharynx clear, dentition fair Lymphatic: No cervical, supraclavicular or axillary adenopathy Lungs no rales or rhonchi, good excursion bilaterally Heart regular rate and rhythm, no murmur appreciated Abd soft, nontender, positive bowel sounds, no liver or spleen tip palpated on exam, no fluid wave  MSK no focal spinal tenderness, no joint  edema Neuro: non-focal, well-oriented, appropriate affect Breasts: Deferred   Lab Results  Component Value Date   WBC 8.0 03/08/2018   HGB 11.2 (L) 03/08/2018   HCT 33.3 (L) 03/08/2018   MCV 91.7 03/08/2018   PLT 237 03/08/2018   Lab Results  Component Value Date   FERRITIN 111 01/11/2018   IRON 56 01/11/2018   TIBC 383 01/11/2018   UIBC 327 01/11/2018   IRONPCTSAT 15 (L) 01/11/2018   Lab Results  Component Value Date   RETICCTPCT 0.9 11/02/2017   RBC 3.63 (L) 03/08/2018   No results found for: KPAFRELGTCHN, LAMBDASER, KAPLAMBRATIO No results found for: IGGSERUM, IGA, IGMSERUM No results found for: TOTALPROTELP, ALBUMINELP, A1GS, A2GS, BETS, BETA2SER, GAMS,  MSPIKE, SPEI   Chemistry      Component Value Date/Time   NA 140 01/11/2018 1331   K 4.6 01/11/2018 1331   CL 106 01/11/2018 1331   CO2 24 01/11/2018 1331   BUN 15 01/11/2018 1331   CREATININE 0.85 01/11/2018 1331      Component Value Date/Time   CALCIUM 9.7 01/11/2018 1331   CALCIUM 9.7 04/08/2011 1053   ALKPHOS 113 01/11/2018 1331   AST 20 01/11/2018 1331   ALT 11 01/11/2018 1331   BILITOT 0.8 01/11/2018 1331      Impression and Plan: Ms. 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 She is symptomatic as mentioned above.  We will see what her iron studies show and bring her back in for infusion if needed.  We will plan to see her back in another 2 months for follow-up. She will contact our office with any questions or concerns. We can certainly see him sooner if need be.   Laverna Peace, NP 7/8/20192:35 PM

## 2018-03-09 LAB — VITAMIN D 25 HYDROXY (VIT D DEFICIENCY, FRACTURES): Vit D, 25-Hydroxy: 32.6 ng/mL (ref 30.0–100.0)

## 2018-03-09 LAB — FERRITIN: Ferritin: 172 ng/mL (ref 11–307)

## 2018-03-09 LAB — IRON AND TIBC
Iron: 71 ug/dL (ref 41–142)
SATURATION RATIOS: 17 % — AB (ref 21–57)
TIBC: 411 ug/dL (ref 236–444)
UIBC: 341 ug/dL

## 2018-03-15 ENCOUNTER — Inpatient Hospital Stay: Payer: BLUE CROSS/BLUE SHIELD

## 2018-03-15 ENCOUNTER — Other Ambulatory Visit: Payer: Self-pay

## 2018-03-15 VITALS — BP 126/80 | HR 75 | Temp 98.2°F | Resp 18

## 2018-03-15 DIAGNOSIS — D509 Iron deficiency anemia, unspecified: Secondary | ICD-10-CM | POA: Diagnosis not present

## 2018-03-15 DIAGNOSIS — D508 Other iron deficiency anemias: Secondary | ICD-10-CM

## 2018-03-15 MED ORDER — SODIUM CHLORIDE 0.9 % IV SOLN
510.0000 mg | Freq: Once | INTRAVENOUS | Status: AC
  Administered 2018-03-15: 510 mg via INTRAVENOUS
  Filled 2018-03-15: qty 17

## 2018-03-15 NOTE — Patient Instructions (Signed)

## 2018-04-19 ENCOUNTER — Other Ambulatory Visit: Payer: Self-pay | Admitting: Family Medicine

## 2018-04-19 DIAGNOSIS — M5412 Radiculopathy, cervical region: Secondary | ICD-10-CM

## 2018-04-21 ENCOUNTER — Ambulatory Visit
Admission: RE | Admit: 2018-04-21 | Discharge: 2018-04-21 | Disposition: A | Discharge status: Home / Self Care | Payer: BLUE CROSS/BLUE SHIELD | Source: Ambulatory Visit | Attending: Family Medicine | Admitting: Family Medicine

## 2018-04-21 DIAGNOSIS — M5412 Radiculopathy, cervical region: Secondary | ICD-10-CM

## 2018-04-22 NOTE — Progress Notes (Signed)
Office Visit Note  Patient: Rhonda Gutierrez             Date of Birth: 13-Aug-1961           MRN: [id number]             PCP: Hulan Fess, MD Referring: Hulan Fess, MD Visit Date: 04/27/2018 Occupation: @GUAROCC @  Subjective:  Neck and arm pain.   History of Present Illness: Rhonda Gutierrez is a 57 y.o. female with history of osteoarthritis and degenerative disc disease.  She states that about 16 days ago she woke up with severe pain in her neck which radiates into her left arm.  She was seen by her primary care physician who did a thorough evaluation and then ordered MRI of her cervical spine.  She was also referred to a orthopedic surgeon and appointment is pending.  She is a schedule an appointment with the neurosurgeon she is seen in the past.  He describes her pain on the scale of 0-10 about 8-9.  She states she has been having pain in her left hand.  Her left hand is tingles and is numb.  Was given a prednisone taper by her PCP still has 3 days left on it.  Also seeing Dr. Murvin Natal for severe gastritis for which she was started on Dexilant.  Activities of Daily Living:  Patient reports morning stiffness for 2 hours.   Patient Reports nocturnal pain.  Difficulty dressing/grooming: Denies Difficulty climbing stairs: Denies Difficulty getting out of chair: Denies Difficulty using hands for taps, buttons, cutlery, and/or writing: Reports  Review of Systems  Constitutional: Positive for fatigue. Negative for night sweats, weight gain and weight loss.  HENT: Negative for mouth sores, trouble swallowing, trouble swallowing, mouth dryness and nose dryness.   Eyes: Negative for pain, redness, visual disturbance and dryness.  Respiratory: Negative for cough, shortness of breath and difficulty breathing.   Cardiovascular: Negative for chest pain, palpitations, hypertension, irregular heartbeat and swelling in legs/feet.  Gastrointestinal: Negative for blood in stool, constipation and  diarrhea.  Endocrine: Negative for increased urination.  Genitourinary: Negative for vaginal dryness.  Musculoskeletal: Positive for arthralgias, joint pain, myalgias, morning stiffness and myalgias. Negative for joint swelling, muscle weakness and muscle tenderness.  Skin: Negative for color change, rash, hair loss, skin tightness, ulcers and sensitivity to sunlight.  Allergic/Immunologic: Negative for susceptible to infections.  Neurological: Negative for dizziness, memory loss, night sweats and weakness.  Hematological: Negative for swollen glands.  Psychiatric/Behavioral: Positive for depressed mood and sleep disturbance. The patient is nervous/anxious.     PMFS History:  Patient Active Problem List   Diagnosis Date Noted  . IDA (iron deficiency anemia) 11/05/2017  . Primary osteoarthritis of both hands 07/10/2017  . Primary osteoarthritis of both knees 07/10/2017  . Plantar fasciitis 07/10/2017  . DDD (degenerative disc disease), cervical 07/10/2017  . Vitamin D deficiency 07/10/2017  . Abnormal laboratory test 07/10/2017  . Gastroenteritis presumed infectious 08/12/2015  . Nausea vomiting and diarrhea 07/30/2015  . ARF (acute renal failure) ([place]) 07/30/2015  . Diarrhea 07/30/2015  . Acute kidney injury ([place]) 07/29/2015  . Pain in joint, ankle and foot 05/03/2013  . Type I (juvenile type) diabetes mellitus with renal manifestations, not stated as uncontrolled(250.41) 01/11/2013  . Carpal tunnel syndrome, bilateral 10/07/2011  . Cough 07/09/2010  . PROTEINURIA, MILD 04/10/2009  . DISTURBANCE OF SKIN SENSATION 01/02/2009  . HYPERLIPIDEMIA 11/21/2008  . RENAL INSUFFICIENCY 11/21/2008  . Ulcerative colitis ([place]) 03/06/2008  .  FATTY LIVER DISEASE 03/06/2008  . Essential hypertension 04/14/2007    Past Medical History:  Diagnosis Date  . Dyslipidemia   . Fatty liver disease, nonalcoholic   . Type I (juvenile type) diabetes mellitus without mention of complication, not stated  as uncontrolled   . Ulcerative colitis, unspecified   . Unspecified essential hypertension     Family History  Problem Relation Age of Onset  . Cancer Father   . Allergy (severe) Daughter    Past Surgical History:  Procedure Laterality Date  . APPENDECTOMY    . CARPAL TUNNEL RELEASE    . CHOLECYSTECTOMY  1993  . ILEOSTOMY  06/17/2006   take down  . Insulin pump    . mass removal      right arm, benign  . NASAL SINUS SURGERY  02/10/1994  . PROTOCOLECTOMY  12/08/2005   total  . stoma  2007   reversal of ileostomy  . 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 She believes that when she returns to work with much more activity, her glucoses will come down some more.    Objective: Vital Signs: BP (!) 144/90 (BP Location: Right Arm, Patient Position: Sitting, Cuff Size: Normal)   Pulse 88   Resp 13   Ht 5' 2"  (1.575 m)   Wt 156 lb (70.8 kg)   BMI 28.53 kg/m    Physical Exam  Constitutional: She is oriented to person, place, and time. She appears well-developed and well-nourished.  HENT:  Head: Normocephalic and atraumatic.  Eyes: Conjunctivae and EOM are normal.  Neck: Normal range of motion.  Cardiovascular: Normal rate, regular rhythm, normal heart sounds and intact distal pulses.  Pulmonary/Chest: Effort normal and breath sounds normal.  Abdominal: Soft. Bowel sounds are normal.  Lymphadenopathy:    She has no cervical adenopathy.  Neurological: She is alert and oriented to person, place, and time.  Skin: Skin is warm and dry. Capillary refill takes less than 2 seconds.  Psychiatric: She has a normal mood and affect. Her behavior is normal.  Nursing note and vitals reviewed.    Musculoskeletal Exam: Patient had painful range of motion of her cervical spine especially with the left lateral rotation and forward flexion.  Shoulder joints were  in good range of motion.  Elbow joints wrist joints were in good range of motion.  She has DIP and PIP thickening in her hands and feet consistent with osteoarthritis.  She also has crepitus in her knee joints without any warmth swelling or effusion.  She has some generalized pain and hyperalgesia.  CDAI Exam: CDAI Score: Not documented Patient Global Assessment: Not documented; Provider Global Assessment: Not documented Swollen: Not documented; Tender: Not documented Joint Exam   Not documented   There is currently no information documented on the homunculus. Go to the Rheumatology activity and complete the homunculus joint exam.  Investigation: No additional findings.  Imaging: Mr Cervical Spine Wo Contrast  Result Date: 04/21/2018 CLINICAL DATA:  Cervical radiculopathy.  Left shoulder and arm pain. EXAM: MRI CERVICAL SPINE WITHOUT CONTRAST TECHNIQUE: Multiplanar, multisequence MR imaging of the cervical spine was performed. No intravenous contrast was administered. COMPARISON:  Cervical MRI 06/14/2016 FINDINGS: Alignment: Normal cervical alignment. Mild anterolisthesis T2-3 with associated disc and facet degeneration. No change from the prior study. Vertebrae: Negative for fracture T2 hyperintense bone lesion involving  the T3 pedicle and lamina unchanged from the prior study. Probable hemangioma. Slight hyperintensity on T1. Cord: Negative for cord compression.  Cord signal normal. Posterior Fossa, vertebral arteries, paraspinal tissues: Normal cord signal. Negative for cord compression. Disc levels: C2-3: Negative C3-4: Tiny central disc protrusion without stenosis C4-5: Mild disc degeneration with mild uncinate spurring. Negative for stenosis. C5-6: Mild disc degeneration with mild uncinate spurring. Negative for stenosis. C6-7: Mild disc degeneration with mild diffuse uncinate spurring. Mild foraminal stenosis unchanged C7-T1: Mild facet degeneration bilaterally without stenosis. IMPRESSION:  Mild disc degeneration and spurring at C4-5, C5-6, and C6-7 unchanged from the prior study. Mild foraminal narrowing bilaterally at C6-7 Bone lesion involving T3 pedicle and lamina on the right unchanged from the prior study. Probable hemangioma. Electronically Signed   By: Franchot Gallo M.D.   On: 04/21/2018 08:41    Recent Labs: Lab Results  Component Value Date   WBC 8.0 03/08/2018   HGB 11.2 (L) 03/08/2018   PLT 237 03/08/2018   NA 139 03/08/2018   K 3.9 03/08/2018   CL 105 03/08/2018   CO2 25 03/08/2018   GLUCOSE 201 (H) 03/08/2018   BUN 14 03/08/2018   CREATININE 0.93 03/08/2018   BILITOT 0.7 03/08/2018   ALKPHOS 118 03/08/2018   AST 28 03/08/2018   ALT 19 03/08/2018   PROT 7.9 03/08/2018   ALBUMIN 4.4 03/08/2018   CALCIUM 10.3 03/08/2018   GFRAA >60 03/08/2018    Speciality Comments: No specialty comments available.  Procedures:  No procedures performed Allergies: Aspartame; Fish oil; Losartan; Mesalamine; Rofecoxib; Clarithromycin; Codeine; Sulfa antibiotics; Sulfasalazine; and Sulfonamide derivatives   Assessment / Plan:     Visit Diagnoses: Polyarthralgia -patient has no synovitis on examination.  Positive RF, negative anti-CCP.  Primary osteoarthritis of both hands-she has DIP and PIP thickening consistent with osteoarthritis.  No synovitis was noted.  Primary osteoarthritis of both knees - Mild OA, mild chondromalacia patella  S/p bilateral carpal tunnel release-doing well.  DDD (degenerative disc disease), cervical-we had detailed discussion regarding the MRI findings.  She has been having increased neck pain and also radiculopathy to the left upper extremity.  She has appointment coming up with orthopedics and also neurosurgeon.  She is already on prednisone taper and gabapentin.  There was nothing else to add.  Myofascial pain-she continues to have some generalized pain and discomfort from myofascial pain syndrome.  Other medical problems are listed as  follows:  Other ulcerative colitis without complication ([place]) - Status post ileostomy and proctectomy 2007.  She has chronic diarrhea because of that.  History of vitamin D deficiency  History of hyperlipidemia  History of anemia  History of colectomy  History of diabetes mellitus - type1  History of chronic kidney disease/ Dr Brayton El manages   History of hypertension  History of fatty infiltration of liver  History of gastroesophageal reflux (GERD) - Followed up by Dr. Renee Harder at [facility].   History of anxiety   Orders: No orders of the defined types were placed in this encounter.  No orders of the defined types were placed in this encounter.   Face-to-face time spent with patient was 30 minutes. Greater than 50% of time was spent in counseling and coordination of care.  Follow-Up Instructions: Return for Osteoarthritis DDD MFPS .   Bo Merino, MD  Note - This record has been created using Editor, commissioning.  Chart creation errors have been sought, but may not always  have been located. Such creation errors do not  reflect on  the standard of medical care.

## 2018-04-27 ENCOUNTER — Ambulatory Visit (INDEPENDENT_AMBULATORY_CARE_PROVIDER_SITE_OTHER): Payer: BLUE CROSS/BLUE SHIELD | Admitting: Rheumatology

## 2018-04-27 ENCOUNTER — Encounter: Payer: Self-pay | Admitting: Rheumatology

## 2018-04-27 VITALS — BP 144/90 | HR 88 | Resp 13 | Ht 62.0 in | Wt 156.0 lb

## 2018-04-27 DIAGNOSIS — M2550 Pain in unspecified joint: Secondary | ICD-10-CM | POA: Diagnosis not present

## 2018-04-27 DIAGNOSIS — Z87448 Personal history of other diseases of urinary system: Secondary | ICD-10-CM

## 2018-04-27 DIAGNOSIS — M19041 Primary osteoarthritis, right hand: Secondary | ICD-10-CM | POA: Diagnosis not present

## 2018-04-27 DIAGNOSIS — Z8719 Personal history of other diseases of the digestive system: Secondary | ICD-10-CM

## 2018-04-27 DIAGNOSIS — M170 Bilateral primary osteoarthritis of knee: Secondary | ICD-10-CM

## 2018-04-27 DIAGNOSIS — Z9049 Acquired absence of other specified parts of digestive tract: Secondary | ICD-10-CM

## 2018-04-27 DIAGNOSIS — Z9889 Other specified postprocedural states: Secondary | ICD-10-CM | POA: Diagnosis not present

## 2018-04-27 DIAGNOSIS — Z862 Personal history of diseases of the blood and blood-forming organs and certain disorders involving the immune mechanism: Secondary | ICD-10-CM

## 2018-04-27 DIAGNOSIS — M19042 Primary osteoarthritis, left hand: Secondary | ICD-10-CM

## 2018-04-27 DIAGNOSIS — Z8679 Personal history of other diseases of the circulatory system: Secondary | ICD-10-CM

## 2018-04-27 DIAGNOSIS — K5180 Other ulcerative colitis without complications: Secondary | ICD-10-CM

## 2018-04-27 DIAGNOSIS — M5030 Other cervical disc degeneration, unspecified cervical region: Secondary | ICD-10-CM

## 2018-04-27 DIAGNOSIS — M7918 Myalgia, other site: Secondary | ICD-10-CM

## 2018-04-27 DIAGNOSIS — Z8659 Personal history of other mental and behavioral disorders: Secondary | ICD-10-CM

## 2018-04-27 DIAGNOSIS — Z8639 Personal history of other endocrine, nutritional and metabolic disease: Secondary | ICD-10-CM

## 2018-05-04 ENCOUNTER — Inpatient Hospital Stay: Payer: BLUE CROSS/BLUE SHIELD | Attending: Family | Admitting: Hematology & Oncology

## 2018-05-04 ENCOUNTER — Inpatient Hospital Stay: Payer: BLUE CROSS/BLUE SHIELD

## 2018-05-04 ENCOUNTER — Other Ambulatory Visit: Payer: Self-pay

## 2018-05-04 ENCOUNTER — Encounter: Payer: Self-pay | Admitting: Hematology & Oncology

## 2018-05-04 DIAGNOSIS — E1122 Type 2 diabetes mellitus with diabetic chronic kidney disease: Secondary | ICD-10-CM | POA: Diagnosis not present

## 2018-05-04 DIAGNOSIS — D631 Anemia in chronic kidney disease: Secondary | ICD-10-CM | POA: Diagnosis not present

## 2018-05-04 DIAGNOSIS — D509 Iron deficiency anemia, unspecified: Secondary | ICD-10-CM | POA: Diagnosis not present

## 2018-05-04 DIAGNOSIS — E559 Vitamin D deficiency, unspecified: Secondary | ICD-10-CM | POA: Diagnosis not present

## 2018-05-04 DIAGNOSIS — D508 Other iron deficiency anemias: Secondary | ICD-10-CM

## 2018-05-04 DIAGNOSIS — N182 Chronic kidney disease, stage 2 (mild): Secondary | ICD-10-CM | POA: Diagnosis not present

## 2018-05-04 HISTORY — DX: Anemia in chronic kidney disease: D63.1

## 2018-05-04 LAB — CBC WITH DIFFERENTIAL (CANCER CENTER ONLY)
BASOS ABS: 0 10*3/uL (ref 0.0–0.1)
Basophils Relative: 0 %
Eosinophils Absolute: 0.1 10*3/uL (ref 0.0–0.5)
Eosinophils Relative: 1 %
HEMATOCRIT: 30.4 % — AB (ref 34.8–46.6)
HEMOGLOBIN: 9.6 g/dL — AB (ref 11.6–15.9)
LYMPHS PCT: 10 %
Lymphs Abs: 0.8 10*3/uL — ABNORMAL LOW (ref 0.9–3.3)
MCH: 30.8 pg (ref 26.0–34.0)
MCHC: 31.6 g/dL — ABNORMAL LOW (ref 32.0–36.0)
MCV: 97.4 fL (ref 81.0–101.0)
Monocytes Absolute: 0.3 10*3/uL (ref 0.1–0.9)
Monocytes Relative: 3 %
Neutro Abs: 6.7 10*3/uL — ABNORMAL HIGH (ref 1.5–6.5)
Neutrophils Relative %: 86 %
Platelet Count: 208 10*3/uL (ref 145–400)
RBC: 3.12 MIL/uL — AB (ref 3.70–5.32)
RDW: 13 % (ref 11.1–15.7)
WBC Count: 7.9 10*3/uL (ref 3.9–10.0)

## 2018-05-04 NOTE — Progress Notes (Signed)
Hematology and Oncology Follow Up Visit  Rhonda Gutierrez [id number] 01-15-1961 57 y.o. 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 Fellows is here today for follow-up.  She got iron back in July.  At that time, her ferritin was 172 with iron saturation of only 17%.  I think that her problem however is that she has a very low erythropoietin level.  Her erythropoietin level back in March was only 10.6.  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  I suspect that she probably is going to need some supplemental erythropoietin, via Aranesp.  Again, her MCV is 97 so I would not think that her iron level is good to be low.  She is had no issues with nausea or vomiting.  She is had no problems with cough.  She does still feel a little bit fatigued..  There is Gutierrez no bleeding.  Her blood sugars have still Gutierrez on the high side.  She gets a new insulin pump tomorrow.  She is had no leg swelling.  There is Gutierrez no rashes.  Overall, her performance status is ECOG 1.  Medications:  Allergies as of 05/04/2018      Reactions   Aspartame Other (See Comments)   Shaking  Shaking  Shaking    Fish Oil Cough   Losartan Cough   Mesalamine Other (See Comments)   Seen things Seen things Seen things   Rofecoxib Other (See Comments)   Hallucinations Other reaction(s): Psychosis (intolerance) Hallucinations Hallucinations   Clarithromycin Rash   Codeine Nausea And Vomiting, Nausea Only   Sulfa Antibiotics Rash   fever fever   Sulfasalazine Rash   fever   Sulfonamide Derivatives Rash   fever      Medication List        Accurate as of 05/04/18  1:30 PM. Always use your most recent med list.          ALPRAZolam 0.25 MG tablet Commonly known  as:  XANAX as needed.   aspirin 81 MG tablet Take 81 mg by mouth daily.   atorvastatin 80 MG tablet Commonly known as:  LIPITOR Take 80 mg by mouth daily.   cloNIDine 0.1 MG tablet Commonly known as:  CATAPRES Take 0.1 mg by mouth at bedtime.   Continuous Glucose Monitor Devi Inject 1 Device into the skin once a week.   cyanocobalamin 1000 MCG/ML injection Commonly known as:  (VITAMIN B-12) Inject into the muscle every 30 (thirty) days.   dexlansoprazole 60 MG capsule Commonly known as:  DEXILANT Take 60 mg by mouth daily.   ergocalciferol 50000 units capsule Commonly known as:  VITAMIN D2 Take 1 capsule (50,000 Units total) by mouth once a week.   fenofibrate 160 MG tablet Take 160 mg by mouth daily.   fluticasone 50 MCG/ACT nasal spray Commonly known as:  FLONASE Place into both nostrils daily.   folic acid 1 MG tablet Commonly known as:  FOLVITE Take 1 mg by mouth daily.   gabapentin 600 MG tablet Commonly known as:  NEURONTIN Take 600 mg by mouth 3 (three) times daily.   GLUCAGEN HYPOKIT 1 MG Solr injection Generic drug:  glucagon INJECT 1 MG INTO THE MUSCLE ONCE AS DIRECTED  insulin lispro 100 UNIT/ML KiwkPen Commonly known as:  HUMALOG Use as directed if pump does not work   HUMALOG 100 UNIT/ML injection Generic drug:  insulin lispro USE IN INSULIN PUMP FOR A TOTAL OF 230 UNITS PER DAY   loperamide 2 MG capsule Commonly known as:  IMODIUM Take 8 mg by mouth 2 (two) times daily.   loratadine 10 MG tablet Commonly known as:  CLARITIN Take 10 mg by mouth daily.   PARADIGM RESERVOIR 3ML Misc 1 Device by Does not apply route every 3 (three) days.   PARADIGM QUICK-SET 32" 9MM Misc 1 Device by Does not apply route every 3 (three) days.   ramipril 2.5 MG capsule Commonly known as:  ALTACE Take 2.5 mg by mouth 2 (two) times daily. Further refills through PCP.   tretinoin 0.05 % cream Commonly known as:  RETIN-A APPLY ONE APPLICATION EVERY  NIGHT AT BEDTIME.   vortioxetine HBr 20 MG Tabs tablet Commonly known as:  TRINTELLIX Take 20 mg by mouth daily.       Allergies:  Allergies  Allergen Reactions  . Aspartame Other (See Comments)    Shaking  Shaking  Shaking   . Fish Oil Cough  . Losartan Cough  . Mesalamine Other (See Comments)    Seen things Seen things Seen things  . Rofecoxib Other (See Comments)    Hallucinations  Other reaction(s): Psychosis (intolerance) Hallucinations Hallucinations  . Clarithromycin Rash  . Codeine Nausea And Vomiting and Nausea Only  . Sulfa Antibiotics Rash    fever  fever  . Sulfasalazine Rash    fever  . Sulfonamide Derivatives Rash    fever    Past Medical History, Surgical history, Social history, and Family History were reviewed and updated.  Review of Systems: Review of Systems  Constitutional: Positive for malaise/fatigue.  HENT: Negative.   Eyes: Negative.   Respiratory: Negative.   Cardiovascular: Negative.   Gastrointestinal: Negative.   Genitourinary: Negative.   Musculoskeletal: Negative.   Skin: Negative.   Neurological: Negative.   Endo/Heme/Allergies: Negative.   Psychiatric/Behavioral: Negative.      Physical Exam:  weight is 160 lb 12 oz (72.9 kg). Her oral temperature is 98.6 F (37 C). Her blood pressure is 128/65 and her pulse is 93. Her respiration is 18 and oxygen saturation is 100%.   Wt Readings from Last 3 Encounters:  05/04/18 160 lb 12 oz (72.9 kg)  04/27/18 156 lb (70.8 kg)  03/08/18 158 lb (71.7 kg)    Physical Exam  Constitutional: She is oriented to person, place, and time.  HENT:  Head: Normocephalic and atraumatic.  Mouth/Throat: Oropharynx is clear and moist.  Eyes: Pupils are equal, round, and reactive to light. EOM are normal.  Neck: Normal range of motion.  Cardiovascular: Normal rate, regular rhythm and normal heart sounds.  Pulmonary/Chest: Effort normal and breath sounds normal.  Abdominal: Soft. Bowel  sounds are normal.  Musculoskeletal: Normal range of motion. She exhibits no edema, tenderness or deformity.  Lymphadenopathy:    She has no cervical adenopathy.  Neurological: She is alert and oriented to person, place, and time.  Skin: Skin is warm and dry. No rash noted. No erythema.  Psychiatric: She has a normal mood and affect. Her behavior is normal. Judgment and thought content normal.  Vitals reviewed.    Lab Results  Component Value Date   WBC 7.9 05/04/2018   HGB 9.6 (L) 05/04/2018   HCT 30.4 (L) 05/04/2018   MCV 97.4 05/04/2018  PLT 208 05/04/2018   Lab Results  Component Value Date   FERRITIN 172 03/08/2018   IRON 71 03/08/2018   TIBC 411 03/08/2018   UIBC 341 03/08/2018   IRONPCTSAT 17 (L) 03/08/2018   Lab Results  Component Value Date   RETICCTPCT 0.9 11/02/2017   RBC 3.12 (L) 05/04/2018   No results found for: KPAFRELGTCHN, LAMBDASER, KAPLAMBRATIO No results found for: IGGSERUM, IGA, IGMSERUM No results found for: Odetta Pink, SPEI   Chemistry      Component Value Date/Time   NA 139 03/08/2018 1333   K 3.9 03/08/2018 1333   CL 105 03/08/2018 1333   CO2 25 03/08/2018 1333   BUN 14 03/08/2018 1333   CREATININE 0.93 03/08/2018 1333      Component Value Date/Time   CALCIUM 10.3 03/08/2018 1333   CALCIUM 9.7 04/08/2011 1053   ALKPHOS 118 03/08/2018 1333   AST 28 03/08/2018 1333   ALT 19 03/08/2018 1333   BILITOT 0.7 03/08/2018 1333      Impression and Plan: Ms. 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  I suspect that she probably is going to need some Aranesp.  I think this would be incredibly helpful.  We will see what her iron levels are.  We probably will start the Aranesp next week.  I talked to her about Aranesp.  We will watch her blood pressure  closely.  I would like to see her back in about a month.  By then, we should hopefully see her blood count improving.    Volanda Napoleon, MD 9/3/20191:30 PM

## 2018-05-05 LAB — VITAMIN D 25 HYDROXY (VIT D DEFICIENCY, FRACTURES): VIT D 25 HYDROXY: 25 ng/mL — AB (ref 30.0–100.0)

## 2018-05-05 LAB — IRON AND TIBC
Iron: 50 ug/dL (ref 41–142)
SATURATION RATIOS: 12 % — AB (ref 21–57)
TIBC: 405 ug/dL (ref 236–444)
UIBC: 355 ug/dL

## 2018-05-05 LAB — FERRITIN: FERRITIN: 224 ng/mL (ref 11–307)

## 2018-05-07 ENCOUNTER — Telehealth: Payer: Self-pay | Admitting: *Deleted

## 2018-05-07 NOTE — Telephone Encounter (Signed)
Message received from patient requesting lab results from Tuesday.  Call placed back to patient and pt notified per order of S. Oasis NP that one dose of Feraheme is needed.  Pt requests to have Feraheme on Tuesday, 05/11/18 with already scheduled injection appt.  Message sent to scheduling.

## 2018-05-11 ENCOUNTER — Inpatient Hospital Stay: Payer: BLUE CROSS/BLUE SHIELD

## 2018-05-11 VITALS — BP 135/64 | HR 93 | Temp 98.2°F | Resp 18

## 2018-05-11 DIAGNOSIS — D509 Iron deficiency anemia, unspecified: Secondary | ICD-10-CM | POA: Diagnosis not present

## 2018-05-11 MED ORDER — SODIUM CHLORIDE 0.9 % IV SOLN
510.0000 mg | Freq: Once | INTRAVENOUS | Status: AC
  Administered 2018-05-11: 510 mg via INTRAVENOUS
  Filled 2018-05-11: qty 17

## 2018-05-11 MED ORDER — SODIUM CHLORIDE 0.9 % IV SOLN
Freq: Once | INTRAVENOUS | Status: AC
  Administered 2018-05-11: 13:00:00 via INTRAVENOUS
  Filled 2018-05-11: qty 250

## 2018-05-11 NOTE — Patient Instructions (Signed)

## 2018-05-18 ENCOUNTER — Inpatient Hospital Stay: Payer: BLUE CROSS/BLUE SHIELD

## 2018-05-18 VITALS — BP 147/69 | HR 97 | Temp 97.9°F | Resp 19

## 2018-05-18 DIAGNOSIS — D509 Iron deficiency anemia, unspecified: Secondary | ICD-10-CM | POA: Diagnosis not present

## 2018-05-18 DIAGNOSIS — D508 Other iron deficiency anemias: Secondary | ICD-10-CM

## 2018-05-18 MED ORDER — DARBEPOETIN ALFA 300 MCG/0.6ML IJ SOSY
300.0000 ug | PREFILLED_SYRINGE | Freq: Once | INTRAMUSCULAR | Status: AC
  Administered 2018-05-18: 300 ug via SUBCUTANEOUS

## 2018-05-18 MED ORDER — DARBEPOETIN ALFA 300 MCG/0.6ML IJ SOSY
PREFILLED_SYRINGE | INTRAMUSCULAR | Status: AC
  Filled 2018-05-18: qty 0.6

## 2018-05-18 NOTE — Patient Instructions (Signed)

## 2018-06-01 ENCOUNTER — Encounter: Payer: Self-pay | Admitting: Hematology & Oncology

## 2018-06-01 ENCOUNTER — Inpatient Hospital Stay: Payer: BLUE CROSS/BLUE SHIELD

## 2018-06-01 ENCOUNTER — Other Ambulatory Visit: Payer: Self-pay

## 2018-06-01 ENCOUNTER — Inpatient Hospital Stay: Payer: BLUE CROSS/BLUE SHIELD | Attending: Family | Admitting: Hematology & Oncology

## 2018-06-01 VITALS — BP 116/71 | HR 85 | Temp 97.8°F | Resp 17 | Wt 161.1 lb

## 2018-06-01 DIAGNOSIS — D509 Iron deficiency anemia, unspecified: Secondary | ICD-10-CM | POA: Diagnosis present

## 2018-06-01 DIAGNOSIS — D631 Anemia in chronic kidney disease: Secondary | ICD-10-CM

## 2018-06-01 DIAGNOSIS — K2970 Gastritis, unspecified, without bleeding: Secondary | ICD-10-CM | POA: Insufficient documentation

## 2018-06-01 DIAGNOSIS — N182 Chronic kidney disease, stage 2 (mild): Secondary | ICD-10-CM | POA: Insufficient documentation

## 2018-06-01 DIAGNOSIS — D508 Other iron deficiency anemias: Secondary | ICD-10-CM

## 2018-06-01 DIAGNOSIS — D510 Vitamin B12 deficiency anemia due to intrinsic factor deficiency: Secondary | ICD-10-CM | POA: Insufficient documentation

## 2018-06-01 LAB — CBC WITH DIFFERENTIAL (CANCER CENTER ONLY)
BASOS ABS: 0 10*3/uL (ref 0.0–0.1)
BASOS PCT: 0 %
EOS PCT: 3 %
Eosinophils Absolute: 0.2 10*3/uL (ref 0.0–0.5)
HCT: 33.2 % — ABNORMAL LOW (ref 34.8–46.6)
Hemoglobin: 10.4 g/dL — ABNORMAL LOW (ref 11.6–15.9)
Lymphocytes Relative: 23 %
Lymphs Abs: 1.5 10*3/uL (ref 0.9–3.3)
MCH: 30.4 pg (ref 26.0–34.0)
MCHC: 31.3 g/dL — ABNORMAL LOW (ref 32.0–36.0)
MCV: 97.1 fL (ref 81.0–101.0)
MONO ABS: 0.4 10*3/uL (ref 0.1–0.9)
Monocytes Relative: 6 %
Neutro Abs: 4.3 10*3/uL (ref 1.5–6.5)
Neutrophils Relative %: 68 %
PLATELETS: 202 10*3/uL (ref 145–400)
RBC: 3.42 MIL/uL — ABNORMAL LOW (ref 3.70–5.32)
RDW: 13.3 % (ref 11.1–15.7)
WBC Count: 6.3 10*3/uL (ref 3.9–10.0)

## 2018-06-01 LAB — CMP (CANCER CENTER ONLY)
ALT: 26 U/L (ref 10–47)
AST: 24 U/L (ref 11–38)
Albumin: 3.6 g/dL (ref 3.5–5.0)
Alkaline Phosphatase: 97 U/L — ABNORMAL HIGH (ref 26–84)
Anion gap: 2 — ABNORMAL LOW (ref 5–15)
BUN: 15 mg/dL (ref 7–22)
CHLORIDE: 112 mmol/L — AB (ref 98–108)
CO2: 26 mmol/L (ref 18–33)
Calcium: 9.6 mg/dL (ref 8.0–10.3)
Creatinine: 0.9 mg/dL (ref 0.60–1.20)
GLUCOSE: 187 mg/dL — AB (ref 73–118)
POTASSIUM: 4.5 mmol/L (ref 3.3–4.7)
Sodium: 140 mmol/L (ref 128–145)
Total Bilirubin: 0.8 mg/dL (ref 0.2–1.6)
Total Protein: 6.9 g/dL (ref 6.4–8.1)

## 2018-06-01 LAB — RETICULOCYTES
RBC.: 3.42 MIL/uL — ABNORMAL LOW (ref 3.70–5.45)
Retic Count, Absolute: 92.3 10*3/uL — ABNORMAL HIGH (ref 33.7–90.7)
Retic Ct Pct: 2.7 % — ABNORMAL HIGH (ref 0.7–2.1)

## 2018-06-01 NOTE — Progress Notes (Signed)
Hematology and Oncology Follow Up Visit  Rhonda Gutierrez [id number] [birth year] 57 y.o. 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  She is having problems with her neck.  She apparently has some bulging disks.  She is gotten some steroid injections.  She said her last injection was about 3 weeks ago.  She did get iron and Aranesp back in early September.  Her hemoglobin is not as high as I thought it would be.  I do not know if this might be from iron deficiency still.  We will see what her iron levels are.  She is had no issues with respect to the ulcerative colitis.  She is had no nausea or vomiting.  Her blood sugars have been a little bit on the high side.  She is had no cough.  There is been no rashes.  Overall, her performance status is ECOG 1.  Medications:  Allergies as of 06/01/2018      Reactions   Aspartame Other (See Comments)   Shaking  Shaking  Shaking    Fish Oil Cough   Losartan Cough   Mesalamine Other (See Comments)   Seen things Seen things Seen things   Rofecoxib Other (See Comments)   Hallucinations Other reaction(s): Psychosis (intolerance) Hallucinations Hallucinations   Clarithromycin Rash   Codeine Nausea And Vomiting, Nausea Only   Sulfa Antibiotics Rash   fever fever   Sulfasalazine Rash   fever   Sulfonamide Derivatives Rash   fever      Medication List        Accurate as of 06/01/18 12:56 PM. Always use your most recent med list.          ALPRAZolam 0.25 MG tablet Commonly known as:  XANAX as needed.   aspirin 81 MG tablet Take 81 mg by mouth daily.   atorvastatin 80 MG tablet Commonly known  as:  LIPITOR Take 80 mg by mouth daily.   cloNIDine 0.1 MG tablet Commonly known as:  CATAPRES Take 0.1 mg by mouth at bedtime.   Continuous Glucose Monitor Devi Inject 1 Device into the skin once a week.   cyanocobalamin 1000 MCG/ML injection Commonly known as:  (VITAMIN B-12) Inject into the muscle every 30 (thirty) days.   dexlansoprazole 60 MG capsule Commonly known as:  DEXILANT Take 60 mg by mouth daily.   ergocalciferol 50000 units capsule Commonly known as:  VITAMIN D2 Take 1 capsule (50,000 Units total) by mouth once a week.   fenofibrate 160 MG tablet Take 160 mg by mouth daily.   fluticasone 50 MCG/ACT nasal spray Commonly known as:  FLONASE Place into both nostrils daily.   folic acid 1 MG tablet Commonly known as:  FOLVITE Take 1 mg by mouth daily.   gabapentin 600 MG tablet Commonly known as:  NEURONTIN Take 600 mg by mouth 3 (three) times daily.   GLUCAGEN HYPOKIT 1 MG Solr injection Generic drug:  glucagon INJECT 1 MG INTO THE MUSCLE ONCE AS DIRECTED   insulin lispro 100 UNIT/ML KiwkPen Commonly known as:  HUMALOG Use as directed if pump does not work   HUMALOG 100 UNIT/ML injection Generic  drug:  insulin lispro USE IN INSULIN PUMP FOR A TOTAL OF 230 UNITS PER DAY   loperamide 2 MG capsule Commonly known as:  IMODIUM Take 8 mg by mouth 2 (two) times daily.   loratadine 10 MG tablet Commonly known as:  CLARITIN Take 10 mg by mouth daily.   PARADIGM RESERVOIR 3ML Misc 1 Device by Does not apply route every 3 (three) days.   PARADIGM QUICK-SET 32" 9MM Misc 1 Device by Does not apply route every 3 (three) days.   ramipril 2.5 MG capsule Commonly known as:  ALTACE Take 2.5 mg by mouth 2 (two) times daily. Further refills through PCP.   tretinoin 0.05 % cream Commonly known as:  RETIN-A APPLY ONE APPLICATION EVERY NIGHT AT BEDTIME.   vortioxetine HBr 20 MG Tabs tablet Commonly known as:  TRINTELLIX Take 20 mg by mouth daily.        Allergies:  Allergies  Allergen Reactions  . Aspartame Other (See Comments)    Shaking  Shaking  Shaking   . Fish Oil Cough  . Losartan Cough  . Mesalamine Other (See Comments)    Seen things Seen things Seen things  . Rofecoxib Other (See Comments)    Hallucinations  Other reaction(s): Psychosis (intolerance) Hallucinations Hallucinations  . Clarithromycin Rash  . Codeine Nausea And Vomiting and Nausea Only  . Sulfa Antibiotics Rash    fever  fever  . Sulfasalazine Rash    fever  . Sulfonamide Derivatives Rash    fever    Past Medical History, Surgical history, Social history, and Family History were reviewed and updated.  Review of Systems: Review of Systems  Constitutional: Positive for malaise/fatigue.  HENT: Negative.   Eyes: Negative.   Respiratory: Negative.   Cardiovascular: Negative.   Gastrointestinal: Negative.   Genitourinary: Negative.   Musculoskeletal: Negative.   Skin: Negative.   Neurological: Negative.   Endo/Heme/Allergies: Negative.   Psychiatric/Behavioral: Negative.      Physical Exam:  weight is 161 lb 1.9 oz (73.1 kg). Her oral temperature is 97.8 F (36.6 C). Her blood pressure is 116/71 and her pulse is 85. Her respiration is 17 and oxygen saturation is 100%.   Wt Readings from Last 3 Encounters:  06/01/18 161 lb 1.9 oz (73.1 kg)  05/04/18 160 lb 12 oz (72.9 kg)  04/27/18 156 lb (70.8 kg)    Physical Exam  Constitutional: She is oriented to person, place, and time.  HENT:  Head: Normocephalic and atraumatic.  Mouth/Throat: Oropharynx is clear and moist.  Eyes: Pupils are equal, round, and reactive to light. EOM are normal.  Neck: Normal range of motion.  Cardiovascular: Normal rate, regular rhythm and normal heart sounds.  Pulmonary/Chest: Effort normal and breath sounds normal.  Abdominal: Soft. Bowel sounds are normal.  Musculoskeletal: Normal range of motion. She exhibits no edema, tenderness or deformity.   Lymphadenopathy:    She has no cervical adenopathy.  Neurological: She is alert and oriented to person, place, and time.  Skin: Skin is warm and dry. No rash noted. No erythema.  Psychiatric: She has a normal mood and affect. Her behavior is normal. Judgment and thought content normal.  Vitals reviewed.    Lab Results  Component Value Date   WBC 6.3 06/01/2018   HGB 10.4 (L) 06/01/2018   HCT 33.2 (L) 06/01/2018   MCV 97.1 06/01/2018   PLT 202 06/01/2018   Lab Results  Component Value Date   FERRITIN 224 05/04/2018   IRON 50 05/04/2018  TIBC 405 05/04/2018   UIBC 355 05/04/2018   IRONPCTSAT 12 (L) 05/04/2018   Lab Results  Component Value Date   RETICCTPCT 0.9 11/02/2017   RBC 3.42 (L) 06/01/2018   No results found for: KPAFRELGTCHN, LAMBDASER, KAPLAMBRATIO No results found for: IGGSERUM, IGA, IGMSERUM No results found for: Odetta Pink, SPEI   Chemistry      Component Value Date/Time   NA 139 03/08/2018 1333   K 3.9 03/08/2018 1333   CL 105 03/08/2018 1333   CO2 25 03/08/2018 1333   BUN 14 03/08/2018 1333   CREATININE 0.93 03/08/2018 1333      Component Value Date/Time   CALCIUM 10.3 03/08/2018 1333   CALCIUM 9.7 04/08/2011 1053   ALKPHOS 118 03/08/2018 1333   AST 28 03/08/2018 1333   ALT 19 03/08/2018 1333   BILITOT 0.7 03/08/2018 1333      Impression and Plan: Rhonda Gutierrez is a very pleasant 57 yo caucasian female with 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   Again, I am little surprised that her levels are not a little better.  I will see what her iron levels are.  Hopefully, we can keep working on her hemoglobin so that she will have better stamina.  I will plan to get her back in 4 weeks.   Volanda Napoleon, MD 10/1/201912:56 PM

## 2018-06-02 ENCOUNTER — Telehealth: Payer: Self-pay | Admitting: *Deleted

## 2018-06-02 LAB — IRON AND TIBC
Iron: 56 ug/dL (ref 41–142)
Saturation Ratios: 13 % — ABNORMAL LOW (ref 21–57)
TIBC: 421 ug/dL (ref 236–444)
UIBC: 365 ug/dL

## 2018-06-02 LAB — FERRITIN: Ferritin: 142 ng/mL (ref 11–307)

## 2018-06-02 NOTE — Telephone Encounter (Addendum)
Patient is aware of results. First appointment made. Will schedule second appointment when here.   ----- Message from Volanda Napoleon, MD sent at 06/02/2018 10:22 AM EDT ----- Call - iron is low!!  Needs 2 doses of Feraheme - give doses 7 days apart.  Rhonda Gutierrez

## 2018-06-04 ENCOUNTER — Other Ambulatory Visit: Payer: Self-pay

## 2018-06-04 ENCOUNTER — Inpatient Hospital Stay: Payer: BLUE CROSS/BLUE SHIELD

## 2018-06-04 VITALS — BP 98/60 | HR 84 | Temp 98.4°F | Resp 18

## 2018-06-04 DIAGNOSIS — D509 Iron deficiency anemia, unspecified: Secondary | ICD-10-CM | POA: Diagnosis not present

## 2018-06-04 DIAGNOSIS — D508 Other iron deficiency anemias: Secondary | ICD-10-CM

## 2018-06-04 MED ORDER — SODIUM CHLORIDE 0.9 % IV SOLN
Freq: Once | INTRAVENOUS | Status: AC
  Administered 2018-06-04: 15:00:00 via INTRAVENOUS
  Filled 2018-06-04: qty 250

## 2018-06-04 MED ORDER — SODIUM CHLORIDE 0.9 % IV SOLN
510.0000 mg | Freq: Once | INTRAVENOUS | Status: AC
  Administered 2018-06-04: 510 mg via INTRAVENOUS
  Filled 2018-06-04: qty 17

## 2018-06-04 NOTE — Patient Instructions (Signed)

## 2018-06-11 ENCOUNTER — Other Ambulatory Visit: Payer: Self-pay

## 2018-06-11 ENCOUNTER — Inpatient Hospital Stay: Payer: BLUE CROSS/BLUE SHIELD

## 2018-06-11 VITALS — BP 115/54 | HR 81 | Temp 98.7°F | Resp 18

## 2018-06-11 DIAGNOSIS — D508 Other iron deficiency anemias: Secondary | ICD-10-CM

## 2018-06-11 DIAGNOSIS — D509 Iron deficiency anemia, unspecified: Secondary | ICD-10-CM | POA: Diagnosis not present

## 2018-06-11 MED ORDER — SODIUM CHLORIDE 0.9 % IV SOLN
Freq: Once | INTRAVENOUS | Status: AC
  Administered 2018-06-11: 13:00:00 via INTRAVENOUS
  Filled 2018-06-11: qty 250

## 2018-06-11 MED ORDER — SODIUM CHLORIDE 0.9 % IV SOLN
510.0000 mg | Freq: Once | INTRAVENOUS | Status: AC
  Administered 2018-06-11: 510 mg via INTRAVENOUS
  Filled 2018-06-11: qty 17

## 2018-06-11 NOTE — Patient Instructions (Signed)

## 2018-07-02 HISTORY — PX: CERVICAL DISCECTOMY: SHX98

## 2018-07-06 ENCOUNTER — Ambulatory Visit: Payer: BLUE CROSS/BLUE SHIELD | Admitting: Hematology & Oncology

## 2018-07-06 ENCOUNTER — Ambulatory Visit: Payer: BLUE CROSS/BLUE SHIELD

## 2018-07-06 ENCOUNTER — Other Ambulatory Visit: Payer: BLUE CROSS/BLUE SHIELD

## 2018-07-23 ENCOUNTER — Inpatient Hospital Stay: Payer: BLUE CROSS/BLUE SHIELD | Attending: Family

## 2018-07-23 ENCOUNTER — Other Ambulatory Visit: Payer: Self-pay

## 2018-07-23 ENCOUNTER — Ambulatory Visit: Payer: BLUE CROSS/BLUE SHIELD

## 2018-07-23 ENCOUNTER — Inpatient Hospital Stay (HOSPITAL_BASED_OUTPATIENT_CLINIC_OR_DEPARTMENT_OTHER): Payer: BLUE CROSS/BLUE SHIELD | Admitting: Hematology & Oncology

## 2018-07-23 ENCOUNTER — Encounter: Payer: Self-pay | Admitting: Hematology & Oncology

## 2018-07-23 ENCOUNTER — Telehealth: Payer: Self-pay | Admitting: Hematology & Oncology

## 2018-07-23 VITALS — BP 135/78 | HR 84 | Temp 98.0°F | Resp 18 | Wt 156.5 lb

## 2018-07-23 DIAGNOSIS — N182 Chronic kidney disease, stage 2 (mild): Secondary | ICD-10-CM

## 2018-07-23 DIAGNOSIS — D631 Anemia in chronic kidney disease: Secondary | ICD-10-CM

## 2018-07-23 DIAGNOSIS — D509 Iron deficiency anemia, unspecified: Secondary | ICD-10-CM

## 2018-07-23 DIAGNOSIS — D508 Other iron deficiency anemias: Secondary | ICD-10-CM

## 2018-07-23 DIAGNOSIS — D510 Vitamin B12 deficiency anemia due to intrinsic factor deficiency: Secondary | ICD-10-CM | POA: Insufficient documentation

## 2018-07-23 DIAGNOSIS — M5030 Other cervical disc degeneration, unspecified cervical region: Secondary | ICD-10-CM

## 2018-07-23 DIAGNOSIS — K51012 Ulcerative (chronic) pancolitis with intestinal obstruction: Secondary | ICD-10-CM

## 2018-07-23 LAB — CMP (CANCER CENTER ONLY)
ALBUMIN: 3.7 g/dL (ref 3.5–5.0)
ALK PHOS: 97 U/L — AB (ref 26–84)
ALT: 15 U/L (ref 10–47)
AST: 23 U/L (ref 11–38)
Anion gap: 11 (ref 5–15)
BILIRUBIN TOTAL: 0.8 mg/dL (ref 0.2–1.6)
BUN: 15 mg/dL (ref 7–22)
CALCIUM: 10.1 mg/dL (ref 8.0–10.3)
CHLORIDE: 107 mmol/L (ref 98–108)
CO2: 27 mmol/L (ref 18–33)
CREATININE: 1.2 mg/dL (ref 0.60–1.20)
Glucose, Bld: 230 mg/dL — ABNORMAL HIGH (ref 73–118)
Potassium: 4.9 mmol/L — ABNORMAL HIGH (ref 3.3–4.7)
Sodium: 145 mmol/L (ref 128–145)
TOTAL PROTEIN: 7.3 g/dL (ref 6.4–8.1)

## 2018-07-23 LAB — CBC WITH DIFFERENTIAL (CANCER CENTER ONLY)
Abs Immature Granulocytes: 0.02 10*3/uL (ref 0.00–0.07)
BASOS ABS: 0.1 10*3/uL (ref 0.0–0.1)
Basophils Relative: 1 %
EOS ABS: 0.1 10*3/uL (ref 0.0–0.5)
Eosinophils Relative: 2 %
HEMATOCRIT: 34.5 % — AB (ref 36.0–46.0)
Hemoglobin: 11 g/dL — ABNORMAL LOW (ref 12.0–15.0)
IMMATURE GRANULOCYTES: 0 %
LYMPHS ABS: 2.1 10*3/uL (ref 0.7–4.0)
LYMPHS PCT: 33 %
MCH: 30.4 pg (ref 26.0–34.0)
MCHC: 31.9 g/dL (ref 30.0–36.0)
MCV: 95.3 fL (ref 80.0–100.0)
MONOS PCT: 6 %
Monocytes Absolute: 0.4 10*3/uL (ref 0.1–1.0)
NEUTROS ABS: 3.7 10*3/uL (ref 1.7–7.7)
NEUTROS PCT: 58 %
NRBC: 0 % (ref 0.0–0.2)
Platelet Count: 237 10*3/uL (ref 150–400)
RBC: 3.62 MIL/uL — ABNORMAL LOW (ref 3.87–5.11)
RDW: 12.3 % (ref 11.5–15.5)
WBC Count: 6.3 10*3/uL (ref 4.0–10.5)

## 2018-07-23 LAB — RETICULOCYTES
Immature Retic Fract: 5.7 % (ref 2.3–15.9)
RBC.: 3.62 MIL/uL — AB (ref 3.87–5.11)
RETIC CT PCT: 1.7 % (ref 0.4–3.1)
Retic Count, Absolute: 62.3 10*3/uL (ref 19.0–186.0)

## 2018-07-23 MED ORDER — DARBEPOETIN ALFA 300 MCG/0.6ML IJ SOSY
PREFILLED_SYRINGE | INTRAMUSCULAR | Status: AC
  Filled 2018-07-23: qty 0.6

## 2018-07-23 NOTE — Progress Notes (Signed)
Hematology and Oncology Follow Up Visit  Rhonda Gutierrez [id number] 15-Oct-1960 57 y.o. 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  She looks quite good.  Shockingly, she just had neck surgery.  She had spinal fusion at C6-C7.  She had this done as an outpatient.  She is wearing a soft collar around Rhonda Gutierrez neck.  She has to wear this for a total of 12 weeks.  She did get iron back in October.  We saw Rhonda Gutierrez then, Rhonda Gutierrez ferritin was 142 but Rhonda Gutierrez iron saturation was only 13%.  She did well with the IV iron.  Rhonda Gutierrez hemoglobin today is 11.  Rhonda Gutierrez MCV is 95.  She is had no issues with bleeding.  She has had no problems with cough or shortness of breath.  There is been no nausea or vomiting.  She is not chewing ice.  Overall, Rhonda Gutierrez performance status is ECOG 1.  Medications:  Allergies as of 07/23/2018      Reactions   Aspartame Other (See Comments)   Shaking  Shaking  Shaking  Shaking  Shaking  Shaking  Shaking  Shaking  Shaking    Clarithromycin Rash   Codeine Nausea And Vomiting, Nausea Only   Mesalamine Other (See Comments)   Seen things Seen things Seen things Other reaction(s): Psychosis (intolerance) Seen things Seen things Seen things Seen things Seen things Seen things   Rofecoxib Other (See Comments), Hives   Hallucinations Other reaction(s): Psychosis (intolerance) Hallucinations Hallucinations Other reaction(s): Other, Other (See Comments), Psychosis (intolerance) Hallucinations Hallucinations Hallucinations Hallucinations Other reaction(s): Psychosis (intolerance) Hallucinations Hallucinations   Sulfasalazine Rash    fever fever fever   Fish Oil Cough   Other reaction(s): Cough (ALLERGY/intolerance)   Losartan Cough   Other reaction(s): Cough (ALLERGY/intolerance)   Sulfa Antibiotics Rash   fever fever   Sulfonamide Derivatives Rash   fever      Medication List        Accurate as of 07/23/18  3:32 PM. Always use your most recent med list.          ALPRAZolam 0.25 MG tablet Commonly known as:  XANAX as needed.   aspirin 81 MG tablet Take 81 mg by mouth daily.   atorvastatin 80 MG tablet Commonly known as:  LIPITOR Take 80 mg by mouth daily.   cloNIDine 0.1 MG tablet Commonly known as:  CATAPRES Take 0.1 mg by mouth at bedtime.   Continuous Glucose Monitor Devi Inject 1 Device into the skin once a week.   cyanocobalamin 1000 MCG/ML injection Commonly known as:  (VITAMIN B-12) Inject into the muscle every 30 (thirty) days.   dexlansoprazole 60 MG capsule Commonly known as:  DEXILANT Take 60 mg by mouth daily.   ergocalciferol 1.25 MG (50000 UT) capsule Commonly known as:  VITAMIN D2 Take 1 capsule (50,000 Units total) by mouth once a week.   fenofibrate 160 MG tablet Take 160 mg by mouth daily.   fluticasone 50 MCG/ACT nasal spray Commonly known as:  FLONASE Place into both nostrils daily.   folic acid 1 MG tablet Commonly known as:  FOLVITE Take 1 mg by mouth daily.  gabapentin 600 MG tablet Commonly known as:  NEURONTIN Take 600 mg by mouth 3 (three) times daily.   GLUCAGEN HYPOKIT 1 MG Solr injection Generic drug:  glucagon INJECT 1 MG INTO THE MUSCLE ONCE AS DIRECTED   insulin lispro 100 UNIT/ML KiwkPen Commonly known as:  HUMALOG Use as directed if pump does not work   HUMALOG 100 UNIT/ML injection Generic drug:  insulin lispro USE IN INSULIN PUMP FOR A TOTAL OF 230 UNITS PER DAY   loperamide 2 MG capsule Commonly known as:  IMODIUM Take 8 mg by mouth 2 (two) times daily.   loratadine 10 MG tablet Commonly known as:  CLARITIN Take 10 mg by  mouth daily.   PARADIGM RESERVOIR 3ML Misc 1 Device by Does not apply route every 3 (three) days.   PARADIGM QUICK-SET 32" 9MM Misc 1 Device by Does not apply route every 3 (three) days.   ramipril 2.5 MG capsule Commonly known as:  ALTACE Take 2.5 mg by mouth 2 (two) times daily. Further refills through PCP.   tretinoin 0.05 % cream Commonly known as:  RETIN-A APPLY ONE APPLICATION EVERY NIGHT AT BEDTIME.   vortioxetine HBr 20 MG Tabs tablet Commonly known as:  TRINTELLIX Take 20 mg by mouth daily.       Allergies:  Allergies  Allergen Reactions  . Aspartame Other (See Comments)    Shaking  Shaking  Shaking  Shaking  Shaking  Shaking  Shaking  Shaking  Shaking    . Clarithromycin Rash  . Codeine Nausea And Vomiting and Nausea Only  . Mesalamine Other (See Comments)    Seen things Seen things Seen things Other reaction(s): Psychosis (intolerance) Seen things Seen things Seen things Seen things Seen things Seen things   . Rofecoxib Other (See Comments) and Hives    Hallucinations  Other reaction(s): Psychosis (intolerance) Hallucinations Hallucinations Other reaction(s): Other, Other (See Comments), Psychosis (intolerance) Hallucinations Hallucinations Hallucinations Hallucinations  Other reaction(s): Psychosis (intolerance) Hallucinations Hallucinations   . Sulfasalazine Rash    fever fever fever  . Fish Oil Cough    Other reaction(s): Cough (ALLERGY/intolerance)  . Losartan Cough    Other reaction(s): Cough (ALLERGY/intolerance)  . Sulfa Antibiotics Rash    fever  fever  . Sulfonamide Derivatives Rash    fever    Past Medical History, Surgical history, Social history, and Family History were reviewed and updated.  Review of Systems: Review of Systems  Constitutional: Positive for malaise/fatigue.  HENT: Negative.   Eyes: Negative.   Respiratory: Negative.   Cardiovascular: Negative.   Gastrointestinal: Negative.    Genitourinary: Negative.   Musculoskeletal: Negative.   Skin: Negative.   Neurological: Negative.   Endo/Heme/Allergies: Negative.   Psychiatric/Behavioral: Negative.      Physical Exam:  weight is 156 lb 8 oz (71 kg). Rhonda Gutierrez oral temperature is 98 F (36.7 C). Rhonda Gutierrez blood pressure is 135/78 and Rhonda Gutierrez pulse is 84. Rhonda Gutierrez respiration is 18 and oxygen saturation is 100%.   Wt Readings from Last 3 Encounters:  07/23/18 156 lb 8 oz (71 kg)  06/01/18 161 lb 1.9 oz (73.1 kg)  05/04/18 160 lb 12 oz (72.9 kg)    Physical Exam  Constitutional: She is oriented to person, place, and time.  HENT:  Head: Normocephalic and atraumatic.  Mouth/Throat: Oropharynx is clear and moist.  She has a soft collar on Rhonda Gutierrez neck.  She took this off.  The surgical scar is healing quite nicely.  There is no erythema or tenderness about  the laminectomy site.  Eyes: Pupils are equal, round, and reactive to light. EOM are normal.  Neck: Normal range of motion.  Cardiovascular: Normal rate, regular rhythm and normal heart sounds.  Pulmonary/Chest: Effort normal and breath sounds normal.  Abdominal: Soft. Bowel sounds are normal.  Musculoskeletal: Normal range of motion. She exhibits no edema, tenderness or deformity.  Lymphadenopathy:    She has no cervical adenopathy.  Neurological: She is alert and oriented to person, place, and time.  Skin: Skin is warm and dry. No rash noted. No erythema.  Psychiatric: She has a normal mood and affect. Rhonda Gutierrez behavior is normal. Judgment and thought content normal.  Vitals reviewed.    Lab Results  Component Value Date   WBC 6.3 07/23/2018   HGB 11.0 (L) 07/23/2018   HCT 34.5 (L) 07/23/2018   MCV 95.3 07/23/2018   PLT 237 07/23/2018   Lab Results  Component Value Date   FERRITIN 142 06/01/2018   IRON 56 06/01/2018   TIBC 421 06/01/2018   UIBC 365 06/01/2018   IRONPCTSAT 13 (L) 06/01/2018   Lab Results  Component Value Date   RETICCTPCT 1.7 07/23/2018   RBC 3.62 (L)  07/23/2018   RBC 3.62 (L) 07/23/2018   No results found for: KPAFRELGTCHN, LAMBDASER, KAPLAMBRATIO No results found for: IGGSERUM, IGA, IGMSERUM No results found for: Odetta Pink, SPEI   Chemistry      Component Value Date/Time   NA 145 07/23/2018 1443   K 4.9 (H) 07/23/2018 1443   CL 107 07/23/2018 1443   CO2 27 07/23/2018 1443   BUN 15 07/23/2018 1443   CREATININE 1.20 07/23/2018 1443      Component Value Date/Time   CALCIUM 10.1 07/23/2018 1443   CALCIUM 9.7 04/08/2011 1053   ALKPHOS 97 (H) 07/23/2018 1443   AST 23 07/23/2018 1443   ALT 15 07/23/2018 1443   BILITOT 0.8 07/23/2018 1443      Impression and Plan: Rhonda Gutierrez is a very pleasant 57 yo caucasian female with 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   Rhonda Gutierrez hemoglobin is coming up slowly but surely.  I would like to hope that Rhonda Gutierrez iron levels will be okay.  If not, we will definitely give Rhonda Gutierrez a dose of iron.  I would like to get Rhonda Gutierrez through the holidays in the wintertime before she has to come back.  I do not want to make it difficult for Rhonda Gutierrez given the surgery and the risk Rhonda Gutierrez having an accident or a fall and then "slipping back" a little bit.   Volanda Napoleon, MD 11/22/20193:32 PM

## 2018-07-23 NOTE — Telephone Encounter (Signed)
Patient did not want to scheduled f/u at this time due to Ins issues.  She may be seeing [facility] physicians in Jan 2020 per 11/22 los

## 2018-07-26 LAB — IRON AND TIBC
Iron: 55 ug/dL (ref 41–142)
Saturation Ratios: 15 % — ABNORMAL LOW (ref 21–57)
TIBC: 368 ug/dL (ref 236–444)
UIBC: 313 ug/dL (ref 120–384)

## 2018-07-26 LAB — FERRITIN: FERRITIN: 253 ng/mL (ref 11–307)

## 2018-07-27 NOTE — Progress Notes (Signed)
Office Visit Note  Patient: Rhonda Gutierrez             Date of Birth: [birth year]           MRN: [id number]             PCP: Hulan Fess, MD Referring: Hulan Fess, MD Visit Date: 08/09/2018 Occupation: @GUAROCC @  Subjective:  Neck pain and hand pain.  History of Present Illness: Rhonda Gutierrez is a 57 y.o. female with history of osteoarthritis, DDD, and myofascial pain syndrome.  According to patient she had emergency C5-6 discectomy on July 02, 2018.  She is a still recovering from that and is in a neck brace.  She has been experiencing discomfort in her hands from osteoarthritis especially her left fifth finger DIP joint.  Her knee joints are not as painful currently. She has been having some generalized pain.  Activities of Daily Living:  Patient reports morning stiffness for 30  minutes.   Patient Denies nocturnal pain.  Difficulty dressing/grooming: Denies Difficulty climbing stairs: Denies Difficulty getting out of chair: Denies Difficulty using hands for taps, buttons, cutlery, and/or writing: Denies  Review of Systems  Constitutional: Positive for fatigue. Negative for night sweats, weight gain and weight loss.  HENT: Negative for mouth sores, trouble swallowing, trouble swallowing, mouth dryness and nose dryness.   Eyes: Negative for pain, redness, visual disturbance and dryness.  Respiratory: Negative for cough, hemoptysis, shortness of breath and difficulty breathing.   Cardiovascular: Negative for chest pain, palpitations, hypertension, irregular heartbeat and swelling in legs/feet.  Gastrointestinal: Negative for blood in stool, constipation and diarrhea.  Endocrine: Negative for increased urination.  Genitourinary: Negative for painful urination and vaginal dryness.  Musculoskeletal: Positive for arthralgias, joint pain, myalgias, morning stiffness and myalgias. Negative for joint swelling, muscle weakness and muscle tenderness.  Skin: Negative for color  change, pallor, rash, hair loss, nodules/bumps, skin tightness, ulcers and sensitivity to sunlight.  Allergic/Immunologic: Negative for susceptible to infections.  Neurological: Negative for dizziness, numbness, headaches, memory loss, night sweats and weakness.  Hematological: Negative for swollen glands.  Psychiatric/Behavioral: Positive for depressed mood. Negative for sleep disturbance. The patient is nervous/anxious.     PMFS History:  Patient Active Problem List   Diagnosis Date Noted  . 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 IDA (iron deficiency anemia) 11/05/2017  . Primary osteoarthritis of both hands 07/10/2017  . Primary osteoarthritis of both knees 07/10/2017  . Plantar fasciitis 07/10/2017  . DDD (degenerative disc disease), cervical 07/10/2017  . Vitamin D deficiency 07/10/2017  . Abnormal laboratory test 07/10/2017  . Gastroenteritis presumed infectious 08/12/2015  . Nausea vomiting and diarrhea 07/30/2015  . ARF (acute renal failure) ([place]) 07/30/2015  . Diarrhea 07/30/2015  . Acute kidney injury ([place]) 07/29/2015  . Pain in joint, ankle and foot 05/03/2013  . Type I (juvenile type) diabetes mellitus with renal manifestations, not stated as uncontrolled(250.41) 01/11/2013  . Carpal tunnel syndrome, bilateral 10/07/2011  . Cough 07/09/2010  . PROTEINURIA, MILD 04/10/2009  . DISTURBANCE OF SKIN SENSATION 01/02/2009  . HYPERLIPIDEMIA 11/21/2008  . RENAL INSUFFICIENCY 11/21/2008  . Ulcerative colitis ([place]) 03/06/2008  . FATTY LIVER DISEASE 03/06/2008  . Essential hypertension 04/14/2007    Past Medical History:  Diagnosis Date  . Dyslipidemia   . 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 Fatty liver disease, nonalcoholic   . Type I (juvenile type) diabetes mellitus without mention of complication, not stated as uncontrolled   . Ulcerative colitis, unspecified   .  Unspecified essential hypertension     Family History  Problem Relation Age of Onset  .  Cancer Father   . Allergy (severe) Daughter    Past Surgical History:  Procedure Laterality Date  . APPENDECTOMY    . CARPAL TUNNEL RELEASE    . CERVICAL DISCECTOMY  07/02/2018   c6,c7  . CHOLECYSTECTOMY  1993  . ILEOSTOMY  06/17/2006   take down  . Insulin pump    . mass removal      right arm, benign  . NASAL SINUS SURGERY  02/10/1994  . PROTOCOLECTOMY  12/08/2005   total  . stoma  2007   reversal of ileostomy  . 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 She believes that when she returns to work with much more activity, her glucoses will come down some more.    Objective: Vital Signs: BP 126/74 (BP Location: Left Arm, Patient Position: Sitting, Cuff Size: Normal)   Pulse 77   Resp 15   Ht 5' 2"  (1.575 m)   Wt 161 lb (73 kg)   BMI 29.45 kg/m    Physical Exam  Constitutional: She is oriented to person, place, and time. She appears well-developed and well-nourished.  HENT:  Head: Normocephalic and atraumatic.  Eyes: Conjunctivae and EOM are normal.  Neck: Normal range of motion.  Cardiovascular: Normal rate, regular rhythm, normal heart sounds and intact distal pulses.  Pulmonary/Chest: Effort normal and breath sounds normal.  Abdominal: Soft. Bowel sounds are normal.  Lymphadenopathy:    She has no cervical adenopathy.  Neurological: She is alert and oriented to person, place, and time.  Skin: Skin is warm and dry. Capillary refill takes less than 2 seconds.  Psychiatric: She has a normal mood and affect. Her behavior is normal.  Nursing note and vitals reviewed.    Musculoskeletal Exam: She is in a neck brace due to recent discectomy of the cervical spine.  She has some tenderness over thoracic spine.  No tenderness over lumbar spine or SI joints.  Elbow joints wrist joints were in good range of motion.  She has bilateral DIP thickening  with no synovitis.  Hip joints, knee joints, ankles were in good range of motion with no synovitis.  She has few positive tender points and hyperalgesia.  CDAI Exam: CDAI Score: Not documented Patient Global Assessment: Not documented; Provider Global Assessment: Not documented Swollen: Not documented; Tender: Not documented Joint Exam   Not documented   There is currently no information documented on the homunculus. Go to the Rheumatology activity and complete the homunculus joint exam.  Investigation: No additional findings.  Imaging: No results found.  Recent Labs: Lab Results  Component Value Date   WBC 6.3 07/23/2018   HGB 11.0 (L) 07/23/2018   PLT 237 07/23/2018   NA 145 07/23/2018   K 4.9 (H) 07/23/2018   CL 107 07/23/2018   CO2 27 07/23/2018   GLUCOSE 230 (H) 07/23/2018   BUN 15 07/23/2018   CREATININE 1.20 07/23/2018   BILITOT 0.8 07/23/2018   ALKPHOS 97 (H) 07/23/2018   AST 23 07/23/2018   ALT 15 07/23/2018   PROT 7.3 07/23/2018   ALBUMIN 3.7 07/23/2018   CALCIUM 10.1 07/23/2018   GFRAA >60 03/08/2018    Speciality Comments: No specialty comments available.  Procedures:  No procedures performed Allergies: Aspartame; Clarithromycin;  Codeine; Mesalamine; Rofecoxib; Sulfasalazine; Fish oil; Losartan; Sulfa antibiotics; and Sulfonamide derivatives   Assessment / Plan:     Visit Diagnoses: Polyarthralgia - Positive RF, negative anti-CCP.  Patient has no synovitis on examination.  Primary osteoarthritis of both hands-she has DIP thickening consistent with osteoarthritis.  Which is causing some discomfort.  Primary osteoarthritis of both knees - Mild OA, mild chondromalacia patella.  Joint protection muscle strengthening was discussed.  DDD (degenerative disc disease), cervical - C5-C6 discectomy July 02, 2018.  She is in a neck brace and still recovering from that.  Myofascial pain-she continues to have some generalized pain and discomfort.  We discussed  regular exercise after recovering from her C-spine surgery.  Other ulcerative colitis without complication ([place]) - Status post ileostomy and proctectomy 2007.  She has chronic diarrhea.   History of colectomy  History of vitamin D deficiency-she is on vitamin D supplement.  Other medical problems are listed as follows:  History of chronic kidney disease/ Dr Brayton El manages   History of anxiety  History of fatty infiltration of liver  History of hyperlipidemia  History of hypertension  History of gastroesophageal reflux (GERD) - Followed up by Dr. Renee Hard[facility] at [facility].   History of anemia - on iron infusions by hematology.  History of diabetes mellitus - type1   Orders: No orders of the defined types were placed in this encounter.  No orders of the defined types were placed in this encounter.   .  Follow-Up Instructions: Return in about 1 year (around 08/10/2019) for Osteoarthritis, DDD, UC.  Have advised patient to contact me in case she develops any inflammation.   Bo Merino, MD  Note - This record has been created using Editor, commissioning.  Chart creation errors have been sought, but may not always  have been located. Such creation errors do not reflect on  the standard of medical care.

## 2018-08-03 ENCOUNTER — Inpatient Hospital Stay: Payer: BLUE CROSS/BLUE SHIELD | Attending: Family

## 2018-08-03 VITALS — BP 126/68 | HR 68 | Temp 98.3°F | Resp 18

## 2018-08-03 DIAGNOSIS — D509 Iron deficiency anemia, unspecified: Secondary | ICD-10-CM | POA: Insufficient documentation

## 2018-08-03 DIAGNOSIS — D508 Other iron deficiency anemias: Secondary | ICD-10-CM

## 2018-08-03 MED ORDER — SODIUM CHLORIDE 0.9 % IV SOLN
510.0000 mg | Freq: Once | INTRAVENOUS | Status: AC
  Administered 2018-08-03: 510 mg via INTRAVENOUS
  Filled 2018-08-03: qty 17

## 2018-08-03 MED ORDER — SODIUM CHLORIDE 0.9 % IV SOLN
Freq: Once | INTRAVENOUS | Status: AC
  Administered 2018-08-03: 15:00:00 via INTRAVENOUS
  Filled 2018-08-03: qty 250

## 2018-08-03 NOTE — Patient Instructions (Signed)

## 2018-08-09 ENCOUNTER — Encounter: Payer: Self-pay | Admitting: Rheumatology

## 2018-08-09 ENCOUNTER — Ambulatory Visit (INDEPENDENT_AMBULATORY_CARE_PROVIDER_SITE_OTHER): Payer: BLUE CROSS/BLUE SHIELD | Admitting: Rheumatology

## 2018-08-09 VITALS — BP 126/74 | HR 77 | Resp 15 | Ht 62.0 in | Wt 161.0 lb

## 2018-08-09 DIAGNOSIS — M170 Bilateral primary osteoarthritis of knee: Secondary | ICD-10-CM

## 2018-08-09 DIAGNOSIS — M19042 Primary osteoarthritis, left hand: Secondary | ICD-10-CM

## 2018-08-09 DIAGNOSIS — M7918 Myalgia, other site: Secondary | ICD-10-CM | POA: Diagnosis not present

## 2018-08-09 DIAGNOSIS — M19041 Primary osteoarthritis, right hand: Secondary | ICD-10-CM

## 2018-08-09 DIAGNOSIS — M5030 Other cervical disc degeneration, unspecified cervical region: Secondary | ICD-10-CM | POA: Diagnosis not present

## 2018-08-09 DIAGNOSIS — M2550 Pain in unspecified joint: Secondary | ICD-10-CM

## 2018-08-09 DIAGNOSIS — Z9049 Acquired absence of other specified parts of digestive tract: Secondary | ICD-10-CM

## 2018-08-09 DIAGNOSIS — Z87448 Personal history of other diseases of urinary system: Secondary | ICD-10-CM

## 2018-08-09 DIAGNOSIS — Z8659 Personal history of other mental and behavioral disorders: Secondary | ICD-10-CM

## 2018-08-09 DIAGNOSIS — K5180 Other ulcerative colitis without complications: Secondary | ICD-10-CM

## 2018-08-09 DIAGNOSIS — Z8719 Personal history of other diseases of the digestive system: Secondary | ICD-10-CM

## 2018-08-09 DIAGNOSIS — Z862 Personal history of diseases of the blood and blood-forming organs and certain disorders involving the immune mechanism: Secondary | ICD-10-CM

## 2018-08-09 DIAGNOSIS — Z8639 Personal history of other endocrine, nutritional and metabolic disease: Secondary | ICD-10-CM

## 2018-08-09 DIAGNOSIS — Z8679 Personal history of other diseases of the circulatory system: Secondary | ICD-10-CM

## 2018-08-17 ENCOUNTER — Telehealth: Payer: Self-pay | Admitting: Hematology & Oncology

## 2018-08-17 NOTE — Telephone Encounter (Signed)
Pt's new insurance plan directed her to:  [facility], [place], [place], [place] [address]  [id number] P [phone] F   Medical records faxed today. She will not be coming back to this facility at this time.

## 2018-08-20 ENCOUNTER — Telehealth: Payer: Self-pay | Admitting: Hematology & Oncology

## 2018-08-20 NOTE — Telephone Encounter (Signed)
Faxed medical records 3 times to [facility]  However, one location says [facility], in chart route for fax,  but it was [facility] fax number - My error for the title.   F: [phone]  FAX:  [place]

## 2019-02-11 ENCOUNTER — Encounter: Payer: Self-pay | Admitting: Family

## 2019-02-15 ENCOUNTER — Telehealth: Payer: Self-pay | Admitting: Family

## 2019-02-15 NOTE — Telephone Encounter (Signed)
I spoke with Rhonda Gutierrez about her recent lab work with [facility]. She states that her iron saturation is 7% and ferritin 23. She was advised to take an over the counter iron supplement but she is also on Dexilant which will likely block her absorption. She is symptomatic with fatigue and weakness. She also found out today her stool is positive for blood and will be following up with GI for further evaluation. She plans to follow-up and request an IV iron infusion.

## 2019-08-10 ENCOUNTER — Ambulatory Visit: Payer: Self-pay | Admitting: Rheumatology

## 2019-09-26 ENCOUNTER — Telehealth: Payer: Self-pay | Admitting: *Deleted

## 2019-09-26 NOTE — Telephone Encounter (Signed)
[id number]/tct-Rhonda Gutierrez/having surg for lumpectomy tomorrow does not have any questions about the surg.  Only going out to get groceries.  Suggested that she have her groceries delivered afrter surgery and R.T. due to the lower immunity that she will have. Also taking care of husband who had total knee surgery 3 weeks ago but is doing well.  Offered prayers and support.  Will call if they anything.  Adalei Novell,BSN,RN3,CCM,CN.

## 2019-09-27 HISTORY — PX: BREAST LUMPECTOMY WITH SENTINEL LYMPH NODE BIOPSY: SHX5597

## 2020-03-27 ENCOUNTER — Telehealth: Payer: Self-pay | Admitting: Hematology & Oncology

## 2020-03-27 NOTE — Telephone Encounter (Signed)
9/13 appts rescheduled due to provider PAL.  Updated letter w/ calendar printed & mailed.

## 2020-05-08 NOTE — Progress Notes (Signed)
Office Visit Note  Patient: Rhonda Gutierrez             Date of Birth: [birth year]           MRN: [id number]             PCP: Hulan Fess, MD Referring: Hulan Fess, MD Visit Date: 05/17/2020 Occupation: @GUAROCC @  Subjective:  Right 3rd trigger finger (recent diagnosis of lichen sclerosus et atrophicus- dx by biopsy)   History of Present Illness: Rhonda Gutierrez is a 59 y.o. female with history of myofascial pain and osteoarthritis.  She states she has been experiencing right 3rd trigger finger.  She still have some swelling over her hands due to osteoarthritis.  She has discomfort going down the steps in her knees.  She continues to have some generalized pain from myofascial pain syndrome.  She states she was recently diagnosed with lichen sclerosus by her dermatologist.  She was also diagnosed with osteoporosis and was placed on Fosamax.  Activities of Daily Living:  Patient reports morning stiffness for  15-20 minutes.   Patient Denies nocturnal pain.  Difficulty dressing/grooming: Denies Difficulty climbing stairs: Denies Difficulty getting out of chair: Denies Difficulty using hands for taps, buttons, cutlery, and/or writing: Reports  Review of Systems  Constitutional: Positive for fatigue.  HENT: Negative for mouth sores, mouth dryness and nose dryness.   Eyes: Negative for pain, itching and dryness.  Respiratory: Negative for shortness of breath and difficulty breathing.   Cardiovascular: Negative for chest pain and palpitations.  Gastrointestinal: Positive for diarrhea. Negative for blood in stool and constipation.  Endocrine: Negative for increased urination.  Genitourinary: Negative for difficulty urinating.  Musculoskeletal: Positive for arthralgias, joint pain, joint swelling, muscle weakness and morning stiffness. Negative for myalgias, muscle tenderness and myalgias.  Skin: Positive for rash. Negative for color change.  Allergic/Immunologic: Negative for  susceptible to infections.  Neurological: Positive for headaches and weakness. Negative for dizziness, numbness and memory loss.  Hematological: Negative for bruising/bleeding tendency.  Psychiatric/Behavioral: Positive for sleep disturbance. Negative for confusion.    PMFS History:  Patient Active Problem List   Diagnosis Date Noted  . Lichen sclerosus et atrophicus 05/17/2020  . Age-related osteoporosis without current pathological fracture 05/17/2020  . 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 IDA (iron deficiency anemia) 11/05/2017  . Primary osteoarthritis of both hands 07/10/2017  . Primary osteoarthritis of both knees 07/10/2017  . Plantar fasciitis 07/10/2017  . DDD (degenerative disc disease), cervical 07/10/2017  . Vitamin D deficiency 07/10/2017  . Abnormal laboratory test 07/10/2017  . Gastroenteritis presumed infectious 08/12/2015  . Nausea vomiting and diarrhea 07/30/2015  . ARF (acute renal failure) ([place]) 07/30/2015  . Diarrhea 07/30/2015  . Acute kidney injury ([place]) 07/29/2015  . Pain in joint, ankle and foot 05/03/2013  . Type I (juvenile type) diabetes mellitus with renal manifestations, not stated as uncontrolled(250.41) 01/11/2013  . Carpal tunnel syndrome, bilateral 10/07/2011  . Cough 07/09/2010  . PROTEINURIA, MILD 04/10/2009  . DISTURBANCE OF SKIN SENSATION 01/02/2009  . HYPERLIPIDEMIA 11/21/2008  . RENAL INSUFFICIENCY 11/21/2008  . Ulcerative colitis ([place]) 03/06/2008  . FATTY LIVER DISEASE 03/06/2008  . Essential hypertension 04/14/2007    Past Medical History:  Diagnosis Date  . Dyslipidemia   . 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 Fatty liver disease, nonalcoholic   . Type I (juvenile type) diabetes mellitus without mention of complication, not stated as uncontrolled   . Ulcerative colitis, unspecified   . Unspecified essential hypertension  Family History  Problem Relation Age of Onset  . Cancer Father   . Allergy (severe)  Daughter    Past Surgical History:  Procedure Laterality Date  . APPENDECTOMY    . BREAST LUMPECTOMY WITH SENTINEL LYMPH NODE BIOPSY Left 09/27/2019  . CARPAL TUNNEL RELEASE    . CERVICAL DISCECTOMY  07/02/2018   c6,c7  . CHOLECYSTECTOMY  1993  . ILEOSTOMY  06/17/2006   take down  . Insulin pump    . mass removal      right arm, benign  . NASAL SINUS SURGERY  02/10/1994  . PROTOCOLECTOMY  12/08/2005   total  . stoma  2007   reversal of ileostomy  . 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 She believes that when she returns to work with much more activity, her glucoses will come down some more.   Immunization History  Administered Date(s) Administered  . Influenza Inj Mdck Quad Pf 05/28/2017  . Influenza Split 07/08/2011  . Influenza Whole 07/02/2008, 06/01/2013  . Influenza,inj,Quad PF,6+ Mos 05/22/2019  . Influenza-Unspecified 07/08/2011, 05/28/2017, 04/25/2020  . PFIZER SARS-COV-2 Vaccination 11/11/2019, 12/02/2019  . Pneumococcal Polysaccharide-23 06/01/2006, 06/01/2013     Objective: Vital Signs: BP 130/77 (BP Location: Right Arm, Patient Position: Sitting, Cuff Size: Normal)   Pulse 82   Resp 14   Ht 5' 2"  (1.575 m)   Wt 165 lb 3.2 oz (74.9 kg)   BMI 30.22 kg/m    Physical Exam Vitals and nursing note reviewed.  Constitutional:      Appearance: She is well-developed.  HENT:     Head: Normocephalic and atraumatic.  Eyes:     Conjunctiva/sclera: Conjunctivae normal.  Cardiovascular:     Rate and Rhythm: Normal rate and regular rhythm.     Heart sounds: Normal heart sounds.  Pulmonary:     Effort: Pulmonary effort is normal.     Breath sounds: Normal breath sounds.  Abdominal:     General: Bowel sounds are normal.     Palpations: Abdomen is soft.  Musculoskeletal:     Cervical back: Normal range of motion.  Lymphadenopathy:       Cervical: No cervical adenopathy.  Skin:    General: Skin is warm and dry.     Capillary Refill: Capillary refill takes less than 2 seconds.  Neurological:     Mental Status: She is alert and oriented to person, place, and time.  Psychiatric:        Behavior: Behavior normal.      Musculoskeletal Exam: She had good range of motion of her cervical spine.  Shoulder joints, elbow joints, wrist joints with good range of motion.  She had bilateral DIP thickening and some PIP prominence.  She had right third flexor tenosynovitis.  Hip joints, knee joints, ankles, MTPs and PIPs with good range of motion.  PIP and DIP thickening was noted.  CDAI Exam: CDAI Score: -- Patient Global: --; Provider Global: -- Swollen: --; Tender: -- Joint Exam 05/17/2020   No joint exam has been documented for this visit   There is currently no information documented on the homunculus. Go to the Rheumatology activity and complete the homunculus joint exam.  Investigation: No additional findings.  Imaging: No results found.  Recent Labs: Lab Results  Component Value Date   WBC 5.3 05/15/2020   HGB 11.5 (  L) 05/15/2020   PLT 199 05/15/2020   NA 142 05/15/2020   K 4.3 05/15/2020   CL 104 05/15/2020   CO2 30 05/15/2020   GLUCOSE 104 (H) 05/15/2020   BUN 25 (H) 05/15/2020   CREATININE 0.85 05/15/2020   BILITOT 0.7 05/15/2020   ALKPHOS 116 05/15/2020   AST 22 05/15/2020   ALT 19 05/15/2020   PROT 7.6 05/15/2020   ALBUMIN 4.5 05/15/2020   CALCIUM 10.7 (H) 05/15/2020   GFRAA >60 05/15/2020    Speciality Comments: No specialty comments available.  Procedures:  No procedures performed Allergies: Aspartame, Clarithromycin, Codeine, Mesalamine, Rofecoxib, Sulfasalazine, Fish oil, Losartan, Sulfa antibiotics, and Sulfonamide derivatives   Assessment / Plan:     Visit Diagnoses: Primary osteoarthritis of both hands-joint protection muscle strengthening was discussed.  Trigger finger, right  middle finger-I offered cortisone injection which she declined.  I have advised her to use topical Voltaren gel or Aspercreme to relieve some of the symptoms.  If she has persistent symptoms she will contact us for cortisone injection.  Primary osteoarthritis of both knees-joint protection muscle strengthening was discussed.  DDD (degenerative disc disease), cervical - s/p discectomy.  She had good range of motion without much discomfort.  Myofascial pain-she continues to have some generalized pain.  Other ulcerative colitis without complication (HCC)  History of colectomy  History of vitamin D deficiency  History of chronic kidney disease/ Dr Brayton El manages   History of fatty infiltration of liver  History of hyperlipidemia  History of anxiety  History of hypertension  History of gastroesophageal reflux (GERD)  History of diabetes mellitus  Age-related osteoporosis without current pathological fracture-she was recently diagnosed with osteoporosis after bone density.  She has been on Fosamax, calcium and vitamin D.  Resistive exercises were emphasized.  Lichen sclerosus et atrophicus - Left breast and right thigh per patient.  Educated about COVID-19 virus infection-she is fully immunized against COVID-19.  A booster was advised when it is available to her.  Use of mask, social distancing and hand hygiene was discussed.   Orders: No orders of the defined types were placed in this encounter.  No orders of the defined types were placed in this encounter.    Follow-Up Instructions: Return in about 6 months (around 11/14/2020) for Osteoarthritis, Osteoporosis.   Bo Merino, MD  Note - This record has been created using Editor, commissioning.  Chart creation errors have been sought, but may not always  have been located. Such creation errors do not reflect on  the standard of medical care.

## 2020-05-14 ENCOUNTER — Other Ambulatory Visit: Payer: Self-pay | Admitting: *Deleted

## 2020-05-14 ENCOUNTER — Ambulatory Visit: Payer: BLUE CROSS/BLUE SHIELD | Admitting: Hematology & Oncology

## 2020-05-14 ENCOUNTER — Inpatient Hospital Stay: Payer: BLUE CROSS/BLUE SHIELD

## 2020-05-14 DIAGNOSIS — D631 Anemia in chronic kidney disease: Secondary | ICD-10-CM

## 2020-05-14 DIAGNOSIS — D508 Other iron deficiency anemias: Secondary | ICD-10-CM

## 2020-05-15 ENCOUNTER — Inpatient Hospital Stay (HOSPITAL_BASED_OUTPATIENT_CLINIC_OR_DEPARTMENT_OTHER): Payer: Medicare Other | Admitting: Hematology & Oncology

## 2020-05-15 ENCOUNTER — Inpatient Hospital Stay: Payer: Medicare Other | Attending: Hematology & Oncology

## 2020-05-15 ENCOUNTER — Other Ambulatory Visit: Payer: Self-pay

## 2020-05-15 VITALS — BP 120/75 | HR 84 | Temp 98.2°F | Wt 164.4 lb

## 2020-05-15 DIAGNOSIS — C50911 Malignant neoplasm of unspecified site of right female breast: Secondary | ICD-10-CM | POA: Insufficient documentation

## 2020-05-15 DIAGNOSIS — Z79811 Long term (current) use of aromatase inhibitors: Secondary | ICD-10-CM | POA: Insufficient documentation

## 2020-05-15 DIAGNOSIS — Z170 Estrogen receptor positive status [ER+]: Secondary | ICD-10-CM | POA: Insufficient documentation

## 2020-05-15 DIAGNOSIS — D631 Anemia in chronic kidney disease: Secondary | ICD-10-CM | POA: Diagnosis not present

## 2020-05-15 DIAGNOSIS — D508 Other iron deficiency anemias: Secondary | ICD-10-CM

## 2020-05-15 DIAGNOSIS — N182 Chronic kidney disease, stage 2 (mild): Secondary | ICD-10-CM | POA: Diagnosis not present

## 2020-05-15 LAB — CMP (CANCER CENTER ONLY)
ALT: 19 U/L (ref 0–44)
AST: 22 U/L (ref 15–41)
Albumin: 4.5 g/dL (ref 3.5–5.0)
Alkaline Phosphatase: 116 U/L (ref 38–126)
Anion gap: 8 (ref 5–15)
BUN: 25 mg/dL — ABNORMAL HIGH (ref 6–20)
CO2: 30 mmol/L (ref 22–32)
Calcium: 10.7 mg/dL — ABNORMAL HIGH (ref 8.9–10.3)
Chloride: 104 mmol/L (ref 98–111)
Creatinine: 0.85 mg/dL (ref 0.44–1.00)
GFR, Est AFR Am: >60 mL/min (ref >60)
GFR, Estimated: >60 mL/min (ref >60)
Glucose, Bld: 104 mg/dL — ABNORMAL HIGH (ref 70–99)
Potassium: 4.3 mmol/L (ref 3.5–5.1)
Sodium: 142 mmol/L (ref 135–145)
Total Bilirubin: 0.7 mg/dL (ref 0.3–1.2)
Total Protein: 7.6 g/dL (ref 6.5–8.1)

## 2020-05-15 LAB — RETIC PANEL
Immature Retic Fract: 10.6 % (ref 2.3–15.9)
RBC.: 3.78 MIL/uL — ABNORMAL LOW (ref 3.87–5.11)
Retic Count, Absolute: 79.8 10*3/uL (ref 19.0–186.0)
Retic Ct Pct: 2.1 % (ref 0.4–3.1)
Reticulocyte Hemoglobin: 34 pg (ref >27.9)

## 2020-05-15 LAB — CBC WITH DIFFERENTIAL (CANCER CENTER ONLY)
Abs Immature Granulocytes: 0.08 10*3/uL — ABNORMAL HIGH (ref 0.00–0.07)
Basophils Absolute: 0 10*3/uL (ref 0.0–0.1)
Basophils Relative: 1 %
Eosinophils Absolute: 0.3 10*3/uL (ref 0.0–0.5)
Eosinophils Relative: 5 %
HCT: 35.2 % — ABNORMAL LOW (ref 36.0–46.0)
Hemoglobin: 11.5 g/dL — ABNORMAL LOW (ref 12.0–15.0)
Immature Granulocytes: 2 %
Lymphocytes Relative: 20 %
Lymphs Abs: 1.1 10*3/uL (ref 0.7–4.0)
MCH: 29.9 pg (ref 26.0–34.0)
MCHC: 32.7 g/dL (ref 30.0–36.0)
MCV: 91.4 fL (ref 80.0–100.0)
Monocytes Absolute: 0.4 10*3/uL (ref 0.1–1.0)
Monocytes Relative: 7 %
Neutro Abs: 3.5 10*3/uL (ref 1.7–7.7)
Neutrophils Relative %: 65 %
Platelet Count: 199 10*3/uL (ref 150–400)
RBC: 3.85 MIL/uL — ABNORMAL LOW (ref 3.87–5.11)
RDW: 12.7 % (ref 11.5–15.5)
WBC Count: 5.3 10*3/uL (ref 4.0–10.5)
nRBC: 0 % (ref 0.0–0.2)

## 2020-05-16 ENCOUNTER — Telehealth: Payer: Self-pay | Admitting: Hematology & Oncology

## 2020-05-16 LAB — IRON AND TIBC
Iron: 41 ug/dL (ref 41–142)
Saturation Ratios: 10 % — ABNORMAL LOW (ref 21–57)
TIBC: 411 ug/dL (ref 236–444)
UIBC: 370 ug/dL (ref 120–384)

## 2020-05-16 LAB — FERRITIN: Ferritin: 209 ng/mL (ref 11–307)

## 2020-05-16 NOTE — Telephone Encounter (Signed)
Appointments scheduled patient declined calendar due to My Chart per 9/14 los

## 2020-05-16 NOTE — Progress Notes (Signed)
Hematology and Oncology Follow Up Visit  Rhonda Gutierrez [id number] 1961-04-22 59 y.o. 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 daily   Interim History:  Ms. Rhonda Gutierrez is here today for follow-up.  I have not seen her for quite a while.  Has been almost 2 years since we last saw her.  The big news is has she now has breast cancer.  She apparently had her routine mammogram back in January.  There was an abnormality noted in the right breast.  She underwent a biopsy which showed invasive ductal carcinoma.  A biopsy was done on 09/08/2019.  The pathology report ([id number]) showed an invasive ductal carcinoma.  This is intermediate grade.  She subsequently underwent a lumpectomy and sentinel node biopsy.  This was done on 09/27/2019.  The tumor was 8 mm.  The Oncotype score was only 13.  There was no lymphovascular space invasion.  All margins were negative.  She underwent radiation therapy.  I believe that she had 5250 rad.  Radiation finished in April 2021.  She subsequently was placed on Arimidex.  She is on 1 mg a day.  She has tolerated this quite well so far.  There is no arthralgias.  I think she did have a bone density test done which did show some osteoporosis.  She is on weekly Fosamax.  She has had problems with iron deficiency.  We will have to see with the iron levels are.  She has had no problems with nausea or vomiting.  She has had no change in bowel or bladder habits.  She has had no rashes.  She is avoided the coronavirus.  There is been no issues with fever.   She has had no headache.  Overall, her performance status is ECOG 1.     Medications:  Allergies as of 05/15/2020      Reactions   Aspartame Other (See Comments)   Shaking  Shaking  Shaking  Shaking  Shaking  Shaking  Shaking  Shaking  Shaking    Clarithromycin Rash   Codeine Nausea And Vomiting, Nausea Only   Mesalamine Other (See Comments)   Seen things Seen things Seen things Other reaction(s): Psychosis (intolerance) _0  Seen things   Rofecoxib Other (See Comments), Hives   Hallucinations Other reaction(s): Psychosis (intolerance) Hallucinations Hallucinations Other reaction(s): Other, Other (See Comments), Psychosis (intolerance) Hallucinations Hallucinations Hallucinations Hallucinations Other reaction(s): Psychosis (intolerance) Hallucinations Hallucinations   Sulfasalazine Rash   fever fever fever   Fish Oil Cough   Other reaction(s): Cough (ALLERGY/intolerance)   Losartan Cough   Other reaction(s): Cough (ALLERGY/intolerance)   Sulfa Antibiotics Rash   fever fever   Sulfonamide Derivatives Rash   fever      Medication List       Accurate as of May 15, 2020 11:59 PM. If you have any questions, ask your nurse or doctor.        alendronate 70 MG tablet Commonly known as: FOSAMAX Take 70 mg by mouth once a week. Take with a full glass of water on  an empty stomach.   ALPRAZolam 0.25 MG tablet Commonly known as: XANAX as needed.   anastrozole 1 MG tablet Commonly known as: ARIMIDEX Take 1 mg by mouth every morning.   aspirin 81 MG tablet Take 81 mg by mouth daily.   atorvastatin 80 MG tablet Commonly known as: LIPITOR Take 80 mg by mouth daily.   Black Cohosh 540 MG Caps Take 1 capsule by mouth daily.   calcium-vitamin D 500-200 MG-UNIT tablet Commonly known as: OSCAL WITH D Take 1 tablet by mouth.   cetirizine 10 MG tablet Commonly known as: ZYRTEC Take 10 mg  by mouth daily.   ciprofloxacin 500 MG tablet Commonly known as: CIPRO Take 500 mg by mouth as needed (For J pouch infection).   cloNIDine 0.1 MG tablet Commonly known as: CATAPRES Take 0.1 mg by mouth at bedtime.   Continuous Glucose Monitor Devi Inject 1 Device into the skin once a week.   cyanocobalamin 1000 MCG/ML injection Commonly known as: (VITAMIN B-12) Inject into the muscle every 30 (thirty) days.   Dexcom G6 Sensor Misc by Does not apply route.   Dexcom G6 Transmitter Misc by Does not apply route.   dexlansoprazole 60 MG capsule Commonly known as: DEXILANT Take 60 mg by mouth daily.   ergocalciferol 1.25 MG (50000 UT) capsule Commonly known as: VITAMIN D2 Take 1 capsule (50,000 Units total) by mouth once a week.   Evening Primrose Oil 1000 MG Caps Take 1 capsule by mouth daily.   famotidine 20 MG tablet Commonly known as: PEPCID Take 20 mg by mouth 2 (two) times daily.   fenofibrate 160 MG tablet Take 160 mg by mouth daily.   fluticasone 50 MCG/ACT nasal spray Commonly known as: FLONASE Place into both nostrils daily.   folic acid 1 MG tablet Commonly known as: FOLVITE Take 1 mg by mouth daily.   gabapentin 600 MG tablet Commonly known as: NEURONTIN Take 600 mg by mouth 3 (three) times daily.   GlucaGen HypoKit 1 MG Solr injection Generic drug: glucagon INJECT 1 MG INTO THE MUSCLE ONCE AS DIRECTED   insulin lispro 100 UNIT/ML KiwkPen Commonly known as: HumaLOG KwikPen Use as directed if pump does not work   HumaLOG 100 UNIT/ML injection Generic drug: insulin lispro USE IN INSULIN PUMP FOR A TOTAL OF 230 UNITS PER DAY   loperamide 2 MG capsule Commonly known as: IMODIUM Take 8 mg by mouth 2 (two) times daily.   loratadine 10 MG tablet Commonly known as: CLARITIN Take 10 mg by mouth daily.   LORazepam 0.5 MG tablet Commonly known as: ATIVAN Take 0.5 mg by mouth daily.   metroNIDAZOLE 500 MG tablet Commonly known as: FLAGYL Take  500 mg by mouth as needed.   multivitamin tablet Take 1 tablet by mouth daily.   nitroGLYCERIN 0.4 mg/hr patch Commonly known as: NITRODUR - Dosed in mg/24 hr Place 0.4 mg onto the skin as needed.   ondansetron 8 MG tablet Commonly known as: ZOFRAN Take 8 mg by mouth every morning.   PARADIGM RESERVOIR 3ML Misc 1 Device by Does not apply route every 3 (three) days.   Paradigm Quick-set 32" 19m Misc 1 Device by Does not apply route every 3 (three) days.   ramipril 2.5 MG capsule Commonly known as: ALTACE Take 2.5 mg by mouth 2 (two) times daily. Further refills through PCP.   simethicone 125 MG chewable tablet Commonly known as: MYLICON Chew 1038mg by mouth as needed for flatulence.  tretinoin 0.05 % cream Commonly known as: RETIN-A APPLY ONE APPLICATION EVERY NIGHT AT BEDTIME.   vortioxetine HBr 20 MG Tabs tablet Commonly known as: TRINTELLIX Take 20 mg by mouth daily.       Allergies:  Allergies  Allergen Reactions  . Aspartame Other (See Comments)    Shaking  Shaking  Shaking  Shaking  Shaking  Shaking  Shaking  Shaking  Shaking    . Clarithromycin Rash  . Codeine Nausea And Vomiting and Nausea Only  . Mesalamine Other (See Comments)    Seen things Seen things Seen things Other reaction(s): Psychosis (intolerance) _0  Seen things   . Rofecoxib Other (See Comments) and Hives    Hallucinations  Other reaction(s): Psychosis (intolerance) Hallucinations Hallucinations Other reaction(s): Other, Other (See Comments), Psychosis (intolerance) Hallucinations Hallucinations Hallucinations Hallucinations  Other reaction(s): Psychosis (intolerance) Hallucinations Hallucinations   . Sulfasalazine Rash    fever fever fever  . Fish Oil Cough    Other reaction(s): Cough (ALLERGY/intolerance)  . Losartan Cough    Other reaction(s): Cough (ALLERGY/intolerance)  . Sulfa Antibiotics Rash     fever  fever  . Sulfonamide Derivatives Rash    fever    Past Medical History, Surgical history, Social history, and Family History were reviewed and updated.  Review of Systems: Review of Systems  Constitutional: Positive for malaise/fatigue.  HENT: Negative.   Eyes: Negative.   Respiratory: Negative.   Cardiovascular: Negative.   Gastrointestinal: Negative.   Genitourinary: Negative.   Musculoskeletal: Negative.   Skin: Negative.   Neurological: Negative.   Endo/Heme/Allergies: Negative.   Psychiatric/Behavioral: Negative.      Physical Exam:  weight is 164 lb 6.4 oz (74.6 kg). Her oral temperature is 98.2 F (36.8 C). Her blood pressure is 120/75 and her pulse is 84. Her oxygen saturation is 98%.   Wt Readings from Last 3 Encounters:  05/15/20 164 lb 6.4 oz (74.6 kg)  08/09/18 161 lb (73 kg)  07/23/18 156 lb 8 oz (71 kg)    Physical Exam Vitals reviewed.  Constitutional:      Comments: Her breast exam shows right breast no masses, edema or erythema.  There is no right axillary adenopathy.  Left breast shows the lumpectomy scar at about the 2 o'clock position.  This is well-healed.  There is some slight contraction of the left breast from radiation.  She has a well-healed lumpectomy scar.  There is no fullness in the left axilla.  HENT:     Head: Normocephalic and atraumatic.  Eyes:     Pupils: Pupils are equal, round, and reactive to light.  Cardiovascular:     Rate and Rhythm: Normal rate and regular rhythm.     Heart sounds: Normal heart sounds.  Pulmonary:     Effort: Pulmonary effort is normal.     Breath sounds: Normal breath sounds.  Abdominal:     General: Bowel sounds are normal.     Palpations: Abdomen is soft.  Musculoskeletal:        General: No tenderness or deformity. Normal range of motion.     Cervical back: Normal range of motion.  Lymphadenopathy:     Cervical: No cervical adenopathy.  Skin:    General: Skin is warm and dry.      Findings: No erythema or rash.  Neurological:     Mental Status: She is alert and oriented to person, place, and time.  Psychiatric:  Behavior: Behavior normal.        Thought Content: Thought content normal.        Judgment: Judgment normal.      Lab Results  Component Value Date   WBC 5.3 05/15/2020   HGB 11.5 (L) 05/15/2020   HCT 35.2 (L) 05/15/2020   MCV 91.4 05/15/2020   PLT 199 05/15/2020   Lab Results  Component Value Date   FERRITIN 253 07/23/2018   IRON 55 07/23/2018   TIBC 368 07/23/2018   UIBC 313 07/23/2018   IRONPCTSAT 15 (L) 07/23/2018   Lab Results  Component Value Date   RETICCTPCT 2.1 05/15/2020   RBC 3.78 (L) 05/15/2020   RBC 3.85 (L) 05/15/2020   No results found for: KPAFRELGTCHN, LAMBDASER, KAPLAMBRATIO No results found for: IGGSERUM, IGA, IGMSERUM No results found for: Ronnald Ramp, A1GS, A2GS, Violet Baldy, MSPIKE, SPEI   Chemistry      Component Value Date/Time   NA 142 05/15/2020 1406   K 4.3 05/15/2020 1406   CL 104 05/15/2020 1406   CO2 30 05/15/2020 1406   BUN 25 (H) 05/15/2020 1406   CREATININE 0.85 05/15/2020 1406      Component Value Date/Time   CALCIUM 10.7 (H) 05/15/2020 1406   CALCIUM 9.7 04/08/2011 1053   ALKPHOS 116 05/15/2020 1406   AST 22 05/15/2020 1406   ALT 19 05/15/2020 1406   BILITOT 0.7 05/15/2020 1406      Impression and Plan: Ms. 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  Her new problem is the early stage-stage I-infiltrating ductal carcinoma of the left breast.  This was found incidentally.  I will she does have the yearly mammograms.  I really do not think that the breast cancer is going to be a problem for her.  I think the risk of recurrence will be less than 5%.  I think the amount of time that she needs to be on Arimidex probably will be 5-7 years.  I would think 5 years probably would be  appropriate.  We will have to see how her iron studies look.  It would not surprise me if we have to give her some iron.  I must say that she does have a great attitude.  She really has gone through the breast cancer quite nicely.  I really am impressed that she has a such a strong constitution.  I would like to get her back in 4 months.  I think if all looks good in 4 months, then we can probably move her to every 6 months.  This is a whole new problem for [place].  Had a spent about 45 minutes with her.   Volanda Napoleon, MD 9/15/20217:19 AM

## 2020-05-17 ENCOUNTER — Encounter: Payer: Self-pay | Admitting: Rheumatology

## 2020-05-17 ENCOUNTER — Ambulatory Visit: Payer: BLUE CROSS/BLUE SHIELD | Admitting: Rheumatology

## 2020-05-17 ENCOUNTER — Other Ambulatory Visit: Payer: Self-pay

## 2020-05-17 VITALS — BP 130/77 | HR 82 | Resp 14 | Ht 62.0 in | Wt 165.2 lb

## 2020-05-17 DIAGNOSIS — M5030 Other cervical disc degeneration, unspecified cervical region: Secondary | ICD-10-CM

## 2020-05-17 DIAGNOSIS — M65331 Trigger finger, right middle finger: Secondary | ICD-10-CM | POA: Diagnosis not present

## 2020-05-17 DIAGNOSIS — Z8639 Personal history of other endocrine, nutritional and metabolic disease: Secondary | ICD-10-CM

## 2020-05-17 DIAGNOSIS — M19041 Primary osteoarthritis, right hand: Secondary | ICD-10-CM | POA: Diagnosis not present

## 2020-05-17 DIAGNOSIS — M170 Bilateral primary osteoarthritis of knee: Secondary | ICD-10-CM | POA: Diagnosis not present

## 2020-05-17 DIAGNOSIS — Z8679 Personal history of other diseases of the circulatory system: Secondary | ICD-10-CM

## 2020-05-17 DIAGNOSIS — Z87448 Personal history of other diseases of urinary system: Secondary | ICD-10-CM

## 2020-05-17 DIAGNOSIS — M810 Age-related osteoporosis without current pathological fracture: Secondary | ICD-10-CM

## 2020-05-17 DIAGNOSIS — Z9049 Acquired absence of other specified parts of digestive tract: Secondary | ICD-10-CM

## 2020-05-17 DIAGNOSIS — M19042 Primary osteoarthritis, left hand: Secondary | ICD-10-CM

## 2020-05-17 DIAGNOSIS — Z8719 Personal history of other diseases of the digestive system: Secondary | ICD-10-CM

## 2020-05-17 DIAGNOSIS — M7918 Myalgia, other site: Secondary | ICD-10-CM

## 2020-05-17 DIAGNOSIS — K5180 Other ulcerative colitis without complications: Secondary | ICD-10-CM

## 2020-05-17 DIAGNOSIS — L900 Lichen sclerosus et atrophicus: Secondary | ICD-10-CM | POA: Insufficient documentation

## 2020-05-17 DIAGNOSIS — Z8659 Personal history of other mental and behavioral disorders: Secondary | ICD-10-CM

## 2020-05-17 DIAGNOSIS — Z7189 Other specified counseling: Secondary | ICD-10-CM

## 2020-05-23 ENCOUNTER — Inpatient Hospital Stay: Payer: Medicare Other

## 2020-05-23 ENCOUNTER — Other Ambulatory Visit: Payer: Self-pay

## 2020-05-23 VITALS — BP 133/81 | HR 83 | Temp 98.1°F | Resp 17

## 2020-05-23 DIAGNOSIS — N182 Chronic kidney disease, stage 2 (mild): Secondary | ICD-10-CM | POA: Diagnosis not present

## 2020-05-23 DIAGNOSIS — D508 Other iron deficiency anemias: Secondary | ICD-10-CM

## 2020-05-23 MED ORDER — SODIUM CHLORIDE 0.9 % IV SOLN
510.0000 mg | Freq: Once | INTRAVENOUS | Status: AC
  Administered 2020-05-23: 510 mg via INTRAVENOUS
  Filled 2020-05-23: qty 510

## 2020-05-23 MED ORDER — SODIUM CHLORIDE 0.9 % IV SOLN
Freq: Once | INTRAVENOUS | Status: AC
  Filled 2020-05-23: qty 250

## 2020-05-30 ENCOUNTER — Other Ambulatory Visit: Payer: Self-pay

## 2020-05-30 ENCOUNTER — Inpatient Hospital Stay: Payer: Medicare Other

## 2020-05-30 VITALS — BP 111/63 | HR 76 | Temp 97.9°F | Resp 18

## 2020-05-30 DIAGNOSIS — D508 Other iron deficiency anemias: Secondary | ICD-10-CM

## 2020-05-30 DIAGNOSIS — N182 Chronic kidney disease, stage 2 (mild): Secondary | ICD-10-CM | POA: Diagnosis not present

## 2020-05-30 MED ORDER — SODIUM CHLORIDE 0.9 % IV SOLN
INTRAVENOUS | Status: DC
  Filled 2020-05-30 (×2): qty 250

## 2020-05-30 MED ORDER — SODIUM CHLORIDE 0.9 % IV SOLN
510.0000 mg | Freq: Once | INTRAVENOUS | Status: AC
  Administered 2020-05-30: 510 mg via INTRAVENOUS
  Filled 2020-05-30: qty 510

## 2020-05-30 NOTE — Patient Instructions (Signed)

## 2020-06-06 ENCOUNTER — Inpatient Hospital Stay: Payer: Medicare Other | Attending: Hematology & Oncology

## 2020-06-06 ENCOUNTER — Other Ambulatory Visit: Payer: Self-pay

## 2020-06-06 VITALS — BP 114/66 | HR 79 | Temp 98.7°F | Resp 17

## 2020-06-06 DIAGNOSIS — N182 Chronic kidney disease, stage 2 (mild): Secondary | ICD-10-CM | POA: Diagnosis present

## 2020-06-06 DIAGNOSIS — D631 Anemia in chronic kidney disease: Secondary | ICD-10-CM | POA: Insufficient documentation

## 2020-06-06 DIAGNOSIS — D508 Other iron deficiency anemias: Secondary | ICD-10-CM

## 2020-06-06 MED ORDER — SODIUM CHLORIDE 0.9% FLUSH
3.0000 mL | Freq: Once | INTRAVENOUS | Status: DC | PRN
  Filled 2020-06-06: qty 10

## 2020-06-06 MED ORDER — SODIUM CHLORIDE 0.9% FLUSH
10.0000 mL | INTRAVENOUS | Status: DC | PRN
  Filled 2020-06-06: qty 10

## 2020-06-06 MED ORDER — SODIUM CHLORIDE 0.9 % IV SOLN
INTRAVENOUS | Status: DC
  Filled 2020-06-06: qty 250

## 2020-06-06 MED ORDER — SODIUM CHLORIDE 0.9 % IV SOLN
510.0000 mg | Freq: Once | INTRAVENOUS | Status: AC
  Administered 2020-06-06: 510 mg via INTRAVENOUS
  Filled 2020-06-06: qty 510

## 2020-06-06 NOTE — Patient Instructions (Signed)

## 2020-06-20 ENCOUNTER — Inpatient Hospital Stay: Payer: Medicare Other

## 2020-06-20 ENCOUNTER — Other Ambulatory Visit: Payer: Self-pay

## 2020-06-20 VITALS — BP 125/67 | HR 73 | Temp 98.2°F | Resp 16

## 2020-06-20 DIAGNOSIS — D508 Other iron deficiency anemias: Secondary | ICD-10-CM

## 2020-06-20 DIAGNOSIS — N182 Chronic kidney disease, stage 2 (mild): Secondary | ICD-10-CM | POA: Diagnosis not present

## 2020-06-20 MED ORDER — SODIUM CHLORIDE 0.9 % IV SOLN
510.0000 mg | Freq: Once | INTRAVENOUS | Status: AC
  Administered 2020-06-20: 510 mg via INTRAVENOUS
  Filled 2020-06-20: qty 510

## 2020-06-20 NOTE — Patient Instructions (Signed)

## 2020-08-27 ENCOUNTER — Telehealth: Payer: Self-pay | Admitting: Radiology

## 2020-08-27 NOTE — Telephone Encounter (Signed)
Patient would like to schedule an appointment with Dr. Estanislado Pandy for a trigger finger injection (right middle finger). Advised patient Rhonda Gutierrez will be in touch with her tomorrow to schedule an appointment.   Best phone # to be reached at: [phone]

## 2020-09-05 ENCOUNTER — Ambulatory Visit: Payer: Medicare Other | Admitting: Rheumatology

## 2020-09-05 ENCOUNTER — Ambulatory Visit: Payer: Self-pay

## 2020-09-05 ENCOUNTER — Telehealth: Payer: Self-pay

## 2020-09-05 ENCOUNTER — Other Ambulatory Visit: Payer: Self-pay

## 2020-09-05 DIAGNOSIS — M65331 Trigger finger, right middle finger: Secondary | ICD-10-CM | POA: Diagnosis not present

## 2020-09-05 MED ORDER — TRIAMCINOLONE ACETONIDE 40 MG/ML IJ SUSP
10.0000 mg | INTRAMUSCULAR | Status: AC | PRN
  Administered 2020-09-05: 10 mg

## 2020-09-05 MED ORDER — LIDOCAINE HCL 1 % IJ SOLN
0.5000 mL | INTRAMUSCULAR | Status: AC | PRN
  Administered 2020-09-05: .5 mL

## 2020-09-05 NOTE — Progress Notes (Signed)
   Procedure Note  Patient: Rhonda Gutierrez             Date of Birth: August 05, 1961           MRN: [id number]             Visit Date: 09/05/2020  Procedures: Visit Diagnoses:  1. Trigger finger, right middle finger     Hand/UE Inj: L long A1 for trigger finger on 09/05/2020 11:33 AM Indications: pain, tendon swelling and therapeutic Details: 27 G needle, ultrasound-guided volar approach Medications: 0.5 mL lidocaine 1 %; 10 mg triamcinolone acetonide 40 MG/ML Aspirate: 0 mL Procedure, treatment alternatives, risks and benefits explained, specific risks discussed. Immediately prior to procedure a time out was called to verify the correct patient, procedure, equipment, support staff and site/side marked as required. Patient was prepped and draped in the usual sterile fashion.    Patient tolerated procedure well.  Postprocedure instructions were given.  Splint was applied. Bo Merino, MD

## 2020-09-05 NOTE — Telephone Encounter (Signed)
Rhonda Gutierrez from Rhonda Gutierrez called stating they received a referral for right middle finger pain for the patient.  Rhonda Gutierrez is requesting a return call regarding the diagnosis.  [phone]

## 2020-09-05 NOTE — Telephone Encounter (Signed)
I called [facility], We do not have a referral entered for this patient.

## 2020-09-14 ENCOUNTER — Telehealth: Payer: Self-pay

## 2020-09-14 ENCOUNTER — Encounter: Payer: Self-pay | Admitting: Hematology & Oncology

## 2020-09-14 ENCOUNTER — Inpatient Hospital Stay (HOSPITAL_BASED_OUTPATIENT_CLINIC_OR_DEPARTMENT_OTHER): Payer: Medicare Other | Admitting: Hematology & Oncology

## 2020-09-14 ENCOUNTER — Other Ambulatory Visit: Payer: Self-pay

## 2020-09-14 ENCOUNTER — Inpatient Hospital Stay: Payer: Medicare Other | Attending: Hematology & Oncology

## 2020-09-14 VITALS — BP 136/71 | HR 73 | Temp 99.0°F | Resp 18 | Wt 161.0 lb

## 2020-09-14 DIAGNOSIS — D631 Anemia in chronic kidney disease: Secondary | ICD-10-CM

## 2020-09-14 DIAGNOSIS — N182 Chronic kidney disease, stage 2 (mild): Secondary | ICD-10-CM | POA: Insufficient documentation

## 2020-09-14 DIAGNOSIS — C50912 Malignant neoplasm of unspecified site of left female breast: Secondary | ICD-10-CM | POA: Insufficient documentation

## 2020-09-14 DIAGNOSIS — Z170 Estrogen receptor positive status [ER+]: Secondary | ICD-10-CM | POA: Diagnosis not present

## 2020-09-14 LAB — CMP (CANCER CENTER ONLY)
ALT: 17 U/L (ref 0–44)
AST: 22 U/L (ref 15–41)
Albumin: 4.4 g/dL (ref 3.5–5.0)
Alkaline Phosphatase: 96 U/L (ref 38–126)
Anion gap: 7 (ref 5–15)
BUN: 15 mg/dL (ref 6–20)
CO2: 29 mmol/L (ref 22–32)
Calcium: 10.2 mg/dL (ref 8.9–10.3)
Chloride: 103 mmol/L (ref 98–111)
Creatinine: 0.9 mg/dL (ref 0.44–1.00)
GFR, Estimated: >60 mL/min (ref >60)
Glucose, Bld: 226 mg/dL — ABNORMAL HIGH (ref 70–99)
Potassium: 4.1 mmol/L (ref 3.5–5.1)
Sodium: 139 mmol/L (ref 135–145)
Total Bilirubin: 0.6 mg/dL (ref 0.3–1.2)
Total Protein: 6.9 g/dL (ref 6.5–8.1)

## 2020-09-14 LAB — CBC WITH DIFFERENTIAL (CANCER CENTER ONLY)
Abs Immature Granulocytes: 0.07 10*3/uL (ref 0.00–0.07)
Basophils Absolute: 0 10*3/uL (ref 0.0–0.1)
Basophils Relative: 1 %
Eosinophils Absolute: 0.2 10*3/uL (ref 0.0–0.5)
Eosinophils Relative: 4 %
HCT: 33.3 % — ABNORMAL LOW (ref 36.0–46.0)
Hemoglobin: 11.2 g/dL — ABNORMAL LOW (ref 12.0–15.0)
Immature Granulocytes: 1 %
Lymphocytes Relative: 17 %
Lymphs Abs: 0.9 10*3/uL (ref 0.7–4.0)
MCH: 31.5 pg (ref 26.0–34.0)
MCHC: 33.6 g/dL (ref 30.0–36.0)
MCV: 93.8 fL (ref 80.0–100.0)
Monocytes Absolute: 0.4 10*3/uL (ref 0.1–1.0)
Monocytes Relative: 8 %
Neutro Abs: 3.7 10*3/uL (ref 1.7–7.7)
Neutrophils Relative %: 69 %
Platelet Count: 190 10*3/uL (ref 150–400)
RBC: 3.55 MIL/uL — ABNORMAL LOW (ref 3.87–5.11)
RDW: 12 % (ref 11.5–15.5)
WBC Count: 5.3 10*3/uL (ref 4.0–10.5)
nRBC: 0 % (ref 0.0–0.2)

## 2020-09-14 LAB — IRON AND TIBC
Iron: 72 ug/dL (ref 28–170)
Saturation Ratios: 18 % (ref 10.4–31.8)
TIBC: 409 ug/dL (ref 250–450)
UIBC: 337 ug/dL

## 2020-09-14 LAB — RETICULOCYTES
Immature Retic Fract: 8.8 % (ref 2.3–15.9)
RBC.: 3.54 MIL/uL — ABNORMAL LOW (ref 3.87–5.11)
Retic Count, Absolute: 75.4 10*3/uL (ref 19.0–186.0)
Retic Ct Pct: 2.1 % (ref 0.4–3.1)

## 2020-09-14 LAB — FERRITIN: Ferritin: 319 ng/mL — ABNORMAL HIGH (ref 11–307)

## 2020-09-14 LAB — LACTATE DEHYDROGENASE: LDH: 144 U/L (ref 98–192)

## 2020-09-14 LAB — SAVE SMEAR(SSMR), FOR PROVIDER SLIDE REVIEW

## 2020-09-14 NOTE — Progress Notes (Signed)
Hematology and Oncology Follow Up Visit  Rhonda Gutierrez [id number] June 28, 1961 60 y.o. 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 daily   Interim History:  Rhonda Gutierrez is here today for follow-up.  She is doing quite well.  We last saw her back in September.  At that time, she has been diagnosed with a infiltrating ductal carcinoma of the left breast.  She underwent lumpectomy.  She had radiation therapy.  She now is on Arimidex.  She has had no problems since we last saw her.  She does see several other doctors for other health issues.  She has had no problems with fever.  She has had no issues with the coronavirus.  She does have some arthritic issues.  She has had no bleeding.  There is no change in bowel or bladder habits.  She has had a good appetite.  She is not a vegetarian.  She has had no leg swelling.  She does have diabetes.  She does not have any neuropathy.  Overall, her performance status is ECOG 0.    Medications:  Allergies as of 09/14/2020      Reactions   Aspartame Other (See Comments)   Shaking  Shaking  Shaking  Shaking  Shaking  Shaking  Shaking  Shaking  Shaking  Shaking  Shaking  Shaking  Shaking  Shaking  Shaking  Shaking  Shaking  Shaking  Shaking    Clarithromycin Rash   Codeine Nausea And Vomiting, Nausea Only, Other (See Comments)   Mesalamine Other (See Comments)   Seen things Seen things Seen things Other reaction(s): Psychosis (intolerance) _0  Seen  things Seen things Seen things Seen things Seen things Other reaction(s): Psychosis (intolerance) _1  Seen things   Rofecoxib Other (See Comments), Hives   Hallucinations Other reaction(s): Psychosis (intolerance) Hallucinations Hallucinations Other reaction(s): Other, Other (See Comments), Psychosis (intolerance) Hallucinations Hallucinations Hallucinations Hallucinations Other reaction(s): Psychosis (intolerance) Hallucinations Hallucinations Hallucinations Hallucinations Other reaction(s): Psychosis (intolerance) Hallucinations Hallucinations Other reaction(s): Other, Other (See Comments), Psychosis (intolerance) Hallucinations Hallucinations Hallucinations Hallucinations Other reaction(s): Psychosis (intolerance) Hallucinations Hallucinations   Sulfasalazine Rash   fever fever fever   Fish Oil Cough, Other (See Comments)   Other reaction(s): Cough (ALLERGY/intolerance) Other reaction(s): Cough (ALLERGY/intolerance)   Losartan Cough, Other (See Comments)   Other reaction(s): Cough (ALLERGY/intolerance) Other reaction(s): Cough (ALLERGY/intolerance)   Sulfa Antibiotics Rash   _2    Sulfonamide Derivatives Rash   fever      Medication List       Accurate as of September 14, 2020  3:30 PM. If you have any questions, ask your nurse or doctor.        STOP taking these medications   Dexcom G6 Sensor Misc Stopped by: Volanda Napoleon, MD   Dexcom G6 Transmitter Misc Stopped by: Volanda Napoleon, MD   dexlansoprazole 60 MG capsule Commonly known as: DEXILANT Stopped by: Volanda Napoleon, MD   Paradigm Quick-set 32" 30m Misc Stopped by: PRudell Cobb  Marin Olp, MD   PARADIGM RESERVOIR 3ML Misc Stopped by: Volanda Napoleon, MD   vortioxetine HBr 20 MG Tabs tablet Commonly known as: TRINTELLIX Stopped by: Volanda Napoleon, MD     TAKE these medications   alendronate 70 MG  tablet Commonly known as: FOSAMAX Take 70 mg by mouth once a week. Take with a full glass of water on an empty stomach.   anastrozole 1 MG tablet Commonly known as: ARIMIDEX Take 1 mg by mouth every morning.   aspirin 81 MG tablet Take 81 mg by mouth daily.   atorvastatin 80 MG tablet Commonly known as: LIPITOR Take 80 mg by mouth daily.   Black Cohosh 540 MG Caps Take 1 capsule by mouth daily.   calcium-vitamin D 500-200 MG-UNIT tablet Commonly known as: OSCAL WITH D Take 1 tablet by mouth.   cetirizine 10 MG tablet Commonly known as: ZYRTEC Take 10 mg by mouth daily as needed.   ciprofloxacin 500 MG tablet Commonly known as: CIPRO Take 500 mg by mouth as needed (For J pouch infection).   cloNIDine 0.1 MG tablet Commonly known as: CATAPRES Take 0.1 mg by mouth at bedtime.   cyanocobalamin 1000 MCG/ML injection Commonly known as: (VITAMIN B-12) Inject into the muscle every 30 (thirty) days.   escitalopram 20 MG tablet Commonly known as: LEXAPRO Take 20 mg by mouth daily.   Evening Primrose Oil 1000 MG Caps Take 1 capsule by mouth daily.   famotidine 20 MG tablet Commonly known as: PEPCID Take 20 mg by mouth 2 (two) times daily.   fenofibrate 160 MG tablet Take 160 mg by mouth daily.   fluticasone 50 MCG/ACT nasal spray Commonly known as: FLONASE Place into both nostrils daily.   folic acid 1 MG tablet Commonly known as: FOLVITE Take 1 mg by mouth daily.   gabapentin 600 MG tablet Commonly known as: NEURONTIN Take 600 mg by mouth 3 (three) times daily.   insulin lispro 100 UNIT/ML KiwkPen Commonly known as: HumaLOG KwikPen Use as directed if pump does not work   HumaLOG 100 UNIT/ML injection Generic drug: insulin lispro USE IN INSULIN PUMP FOR A TOTAL OF 230 UNITS PER DAY   loperamide 2 MG capsule Commonly known as: IMODIUM Take 8 mg by mouth 2 (two) times daily.   LORazepam 1 MG tablet Commonly known as: ATIVAN Take 1 mg by mouth as needed  for anxiety.   metroNIDAZOLE 500 MG tablet Commonly known as: FLAGYL Take 500 mg by mouth as needed.   multivitamin tablet Take 1 tablet by mouth daily.   nitroGLYCERIN 0.4 mg/hr patch Commonly known as: NITRODUR - Dosed in mg/24 hr Place 0.4 mg onto the skin as needed.   omeprazole 40 MG capsule Commonly known as: PRILOSEC Take 20 mg by mouth daily.   ondansetron 8 MG tablet Commonly known as: ZOFRAN Take 8 mg by mouth every morning.   ramipril 2.5 MG capsule Commonly known as: ALTACE Take 2.5 mg by mouth 2 (two) times daily. Further refills through PCP.   simethicone 125 MG chewable tablet Commonly known as: MYLICON Chew 810 mg by mouth as needed for flatulence.   tretinoin 0.05 % cream Commonly known as: RETIN-A APPLY ONE APPLICATION EVERY NIGHT AT BEDTIME.   Vitamin D3 25 MCG (1000 UT) Caps Take 1,000 Units by mouth daily.       Allergies:  Allergies  Allergen Reactions  . Aspartame Other (See Comments)    Shaking  Shaking  Shaking  Shaking  Shaking  Shaking  Shaking  Shaking  Shaking   Shaking  King Clarithromycin Rash  . Codeine Nausea And Vomiting, Nausea Only and Other (See Comments)  . Mesalamine Other (See Comments)    Seen things Seen things Seen things Other reaction(s): Psychosis (intolerance) _0  Seen things  Seen things Seen things Seen things Seen things Other reaction(s): Psychosis (intolerance) _1  Seen things  . Rofecoxib Other (See Comments) and Hives    Hallucinations  Other reaction(s): Psychosis (intolerance) Hallucinations Hallucinations Other reaction(s): Other, Other (See Comments), Psychosis (intolerance) Hallucinations Hallucinations Hallucinations Hallucinations  Other reaction(s): Psychosis  (intolerance) Hallucinations Hallucinations  Hallucinations Hallucinations  Other reaction(s): Psychosis (intolerance) Hallucinations Hallucinations Other reaction(s): Other, Other (See Comments), Psychosis (intolerance) Hallucinations Hallucinations Hallucinations Hallucinations  Other reaction(s): Psychosis (intolerance) Hallucinations Hallucinations  . Sulfasalazine Rash    fever fever fever  . Fish Oil Cough and Other (See Comments)    Other reaction(s): Cough (ALLERGY/intolerance) Other reaction(s): Cough (ALLERGY/intolerance)  . Losartan Cough and Other (See Comments)    Other reaction(s): Cough (ALLERGY/intolerance) Other reaction(s): Cough (ALLERGY/intolerance)  . Sulfa Antibiotics Rash    fever  fever fever  fever fever  . Sulfonamide Derivatives Rash    fever    Past Medical History, Surgical history, Social history, and Family History were reviewed and updated.  Review of Systems: Review of Systems  Constitutional: Positive for malaise/fatigue.  HENT: Negative.   Eyes: Negative.   Respiratory: Negative.   Cardiovascular: Negative.   Gastrointestinal: Negative.   Genitourinary: Negative.   Musculoskeletal: Negative.   Skin: Negative.   Neurological: Negative.   Endo/Heme/Allergies: Negative.   Psychiatric/Behavioral: Negative.      Physical Exam:  weight is 161 lb (73 kg). Her oral temperature is 99 F (37.2 C). Her blood pressure is 136/71 and her pulse is 73. Her respiration is 18 and oxygen saturation is 98%.   Wt Readings from Last 3 Encounters:  09/14/20 161 lb (73 kg)  05/17/20 165 lb 3.2 oz (74.9 kg)  05/15/20 164 lb 6.4 oz (74.6 kg)    Physical Exam Vitals reviewed.  Constitutional:      Comments: Her breast exam shows right breast no masses, edema or erythema.  There is no right axillary adenopathy.  Left breast shows the lumpectomy scar at about the 2 o'clock position.  This is well-healed.  There is some slight contraction  of the left breast from radiation.  She has a well-healed lumpectomy scar.  There is no fullness in the left axilla.  HENT:     Head: Normocephalic and atraumatic.  Eyes:     Pupils: Pupils are equal, round, and reactive to light.  Cardiovascular:     Rate and Rhythm: Normal rate and regular rhythm.     Heart sounds: Normal heart sounds.  Pulmonary:     Effort: Pulmonary effort is normal.     Breath sounds: Normal breath sounds.  Abdominal:     General: Bowel sounds are normal.     Palpations: Abdomen is soft.  Musculoskeletal:        General: No tenderness or deformity. Normal range of motion.     Cervical back: Normal range of motion.  Lymphadenopathy:     Cervical: No cervical adenopathy.  Skin:    General: Skin is warm  and dry.     Findings: No erythema or rash.  Neurological:     Mental Status: She is alert and oriented to person, place, and time.  Psychiatric:        Behavior: Behavior normal.        Thought Content: Thought content normal.        Judgment: Judgment normal.      Lab Results  Component Value Date   WBC 5.3 09/14/2020   HGB 11.2 (L) 09/14/2020   HCT 33.3 (L) 09/14/2020   MCV 93.8 09/14/2020   PLT 190 09/14/2020   Lab Results  Component Value Date   FERRITIN 209 05/15/2020   IRON 41 05/15/2020   TIBC 411 05/15/2020   UIBC 370 05/15/2020   IRONPCTSAT 10 (L) 05/15/2020   Lab Results  Component Value Date   RETICCTPCT 2.1 09/14/2020   RBC 3.54 (L) 09/14/2020   No results found for: KPAFRELGTCHN, LAMBDASER, KAPLAMBRATIO No results found for: IGGSERUM, IGA, IGMSERUM No results found for: Ronnald Ramp, A1GS, A2GS, Tillman Sers, SPEI   Chemistry      Component Value Date/Time   NA 139 09/14/2020 1436   K 4.1 09/14/2020 1436   CL 103 09/14/2020 1436   CO2 29 09/14/2020 1436   BUN 15 09/14/2020 1436   CREATININE 0.90 09/14/2020 1436      Component Value Date/Time   CALCIUM 10.2 09/14/2020 1436   CALCIUM 9.7  04/08/2011 1053   ALKPHOS 96 09/14/2020 1436   AST 22 09/14/2020 1436   ALT 17 09/14/2020 1436   BILITOT 0.6 09/14/2020 1436      Impression and Plan: Rhonda Gutierrez is a very pleasant 60 yo caucasian female with 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  Her recent problem is the early stage-stage I-infiltrating ductal carcinoma of the left breast.  This was found incidentally.  Her last mammogram was back in November.  Everything looked okay on the mammogram.    I really do not think that the breast cancer is going to be a problem for her.  I think the risk of recurrence will be less than 5%.  I think the amount of time that she needs to be on Arimidex probably will be 5-7 years.  I would think 5 years probably would be appropriate.  We will have to see how her iron studies look.  It would not surprise me if we have to give her some iron.  I must say that she does have a great attitude.  She really has gone through the breast cancer quite nicely.  I really am impressed that she has a such a strong constitution.  I would like to get her back in 6 months.   Volanda Napoleon, MD 1/14/20223:30 PM

## 2020-09-14 NOTE — Telephone Encounter (Signed)
appts made for pt per 09/14/20 los  aom

## 2020-09-17 ENCOUNTER — Encounter: Payer: Self-pay | Admitting: *Deleted

## 2020-10-23 ENCOUNTER — Other Ambulatory Visit: Payer: Self-pay | Admitting: Family

## 2020-11-03 NOTE — Progress Notes (Deleted)
Office Visit Note  Patient: Rhonda Gutierrez             Date of Birth: 1961/08/09           MRN: [id number]             PCP: Hulan Fess, MD Referring: Hulan Fess, MD Visit Date: 11/15/2020 Occupation: @GUAROCC @  Subjective:  No chief complaint on file.   History of Present Illness: Rhonda Gutierrez is a 61 y.o. female ***   Activities of Daily Living:  Patient reports morning stiffness for *** {minute/hour:19697}.   Patient {ACTIONS;DENIES/REPORTS:21021675::"Denies"} nocturnal pain.  Difficulty dressing/grooming: {ACTIONS;DENIES/REPORTS:21021675::"Denies"} Difficulty climbing stairs: {ACTIONS;DENIES/REPORTS:21021675::"Denies"} Difficulty getting out of chair: {ACTIONS;DENIES/REPORTS:21021675::"Denies"} Difficulty using hands for taps, buttons, cutlery, and/or writing: {ACTIONS;DENIES/REPORTS:21021675::"Denies"}  No Rheumatology ROS completed.   PMFS History:  Patient Active Problem List   Diagnosis Date Noted  . Lichen sclerosus et atrophicus 05/17/2020  . Age-related osteoporosis without current pathological fracture 05/17/2020  . 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 IDA (iron deficiency anemia) 11/05/2017  . Primary osteoarthritis of both hands 07/10/2017  . Primary osteoarthritis of both knees 07/10/2017  . Plantar fasciitis 07/10/2017  . DDD (degenerative disc disease), cervical 07/10/2017  . Vitamin D deficiency 07/10/2017  . Abnormal laboratory test 07/10/2017  . Gastroenteritis presumed infectious 08/12/2015  . Nausea vomiting and diarrhea 07/30/2015  . ARF (acute renal failure) ([place]) 07/30/2015  . Diarrhea 07/30/2015  . Acute kidney injury ([place]) 07/29/2015  . Pain in joint, ankle and foot 05/03/2013  . Type I (juvenile type) diabetes mellitus with renal manifestations, not stated as uncontrolled(250.41) 01/11/2013  . Carpal tunnel syndrome, bilateral 10/07/2011  . Cough 07/09/2010  . PROTEINURIA, MILD 04/10/2009  . DISTURBANCE OF SKIN  SENSATION 01/02/2009  . HYPERLIPIDEMIA 11/21/2008  . RENAL INSUFFICIENCY 11/21/2008  . Ulcerative colitis ([place]) 03/06/2008  . FATTY LIVER DISEASE 03/06/2008  . Essential hypertension 04/14/2007    Past Medical History:  Diagnosis Date  . Dyslipidemia   . 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 Fatty liver disease, nonalcoholic   . Type I (juvenile type) diabetes mellitus without mention of complication, not stated as uncontrolled   . Ulcerative colitis, unspecified   . Unspecified essential hypertension     Family History  Problem Relation Age of Onset  . Cancer Father   . Allergy (severe) Daughter    Past Surgical History:  Procedure Laterality Date  . APPENDECTOMY    . BREAST LUMPECTOMY WITH SENTINEL LYMPH NODE BIOPSY Left 09/27/2019  . CARPAL TUNNEL RELEASE    . CERVICAL DISCECTOMY  07/02/2018   c6,c7  . CHOLECYSTECTOMY  1993  . ILEOSTOMY  06/17/2006   take down  . Insulin pump    . mass removal      right arm, benign  . NASAL SINUS SURGERY  02/10/1994  . PROTOCOLECTOMY  12/08/2005   total  . stoma  2007   reversal of ileostomy  . 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 She believes that when she returns to work with much more activity, her glucoses will come down some more.   Immunization History  Administered Date(s) Administered  . Influenza Inj Mdck Quad Pf 05/28/2017  . Influenza Split 07/08/2011  . Influenza Whole 07/02/2008, 06/01/2013  . Influenza,inj,Quad PF,6+ Mos 05/22/2019  . Influenza-Unspecified 07/08/2011, 05/28/2017, 04/25/2020  . PFIZER(Purple Top)SARS-COV-2 Vaccination 11/11/2019,  12/02/2019  . Pneumococcal Polysaccharide-23 06/01/2006, 06/01/2013     Objective: Vital Signs: There were no vitals taken for this visit.   Physical Exam   Musculoskeletal Exam: ***  CDAI Exam: CDAI Score: - Patient  Global: -; Provider Global: - Swollen: -; Tender: - Joint Exam 11/15/2020   No joint exam has been documented for this visit   There is currently no information documented on the homunculus. Go to the Rheumatology activity and complete the homunculus joint exam.  Investigation: No additional findings.  Imaging: No results found.  Recent Labs: Lab Results  Component Value Date   WBC 5.3 09/14/2020   HGB 11.2 (L) 09/14/2020   PLT 190 09/14/2020   NA 139 09/14/2020   K 4.1 09/14/2020   CL 103 09/14/2020   CO2 29 09/14/2020   GLUCOSE 226 (H) 09/14/2020   BUN 15 09/14/2020   CREATININE 0.90 09/14/2020   BILITOT 0.6 09/14/2020   ALKPHOS 96 09/14/2020   AST 22 09/14/2020   ALT 17 09/14/2020   PROT 6.9 09/14/2020   ALBUMIN 4.4 09/14/2020   CALCIUM 10.2 09/14/2020   GFRAA >60 05/15/2020    Speciality Comments: No specialty comments available.  Procedures:  No procedures performed Allergies: Aspartame, Clarithromycin, Codeine, Mesalamine, Rofecoxib, Sulfasalazine, Fish oil, Losartan, Sulfa antibiotics, and Sulfonamide derivatives   Assessment / Plan:     Visit Diagnoses: Primary osteoarthritis of both hands  Trigger finger, right middle finger  Primary osteoarthritis of both knees  DDD (degenerative disc disease), cervical  Age-related osteoporosis without current pathological fracture  Myofascial pain  Other ulcerative colitis without complication (HCC)  History of colectomy  History of fatty infiltration of liver  History of gastroesophageal reflux (GERD)  History of chronic kidney disease/ Dr Brayton El manages   History of vitamin D deficiency  History of hyperlipidemia  History of diabetes mellitus  History of hypertension  History of anxiety  Lichen sclerosus et atrophicus  Orders: No orders of the defined types were placed in this encounter.  No orders of the defined types were placed in this encounter.   Face-to-face time spent with  patient was *** minutes. Greater than 50% of time was spent in counseling and coordination of care.  Follow-Up Instructions: No follow-ups on file.   Bo Merino, MD  Note - This record has been created using Editor, commissioning.  Chart creation errors have been sought, but may not always  have been located. Such creation errors do not reflect on  the standard of medical care.

## 2020-11-15 ENCOUNTER — Ambulatory Visit: Payer: Medicare Other | Admitting: Rheumatology

## 2020-11-15 DIAGNOSIS — M170 Bilateral primary osteoarthritis of knee: Secondary | ICD-10-CM

## 2020-11-15 DIAGNOSIS — M5030 Other cervical disc degeneration, unspecified cervical region: Secondary | ICD-10-CM

## 2020-11-15 DIAGNOSIS — M19041 Primary osteoarthritis, right hand: Secondary | ICD-10-CM

## 2020-11-15 DIAGNOSIS — K5180 Other ulcerative colitis without complications: Secondary | ICD-10-CM

## 2020-11-15 DIAGNOSIS — M65331 Trigger finger, right middle finger: Secondary | ICD-10-CM

## 2020-11-15 DIAGNOSIS — M7918 Myalgia, other site: Secondary | ICD-10-CM

## 2020-11-15 DIAGNOSIS — Z87448 Personal history of other diseases of urinary system: Secondary | ICD-10-CM

## 2020-11-15 DIAGNOSIS — Z8719 Personal history of other diseases of the digestive system: Secondary | ICD-10-CM

## 2020-11-15 DIAGNOSIS — Z8679 Personal history of other diseases of the circulatory system: Secondary | ICD-10-CM

## 2020-11-15 DIAGNOSIS — Z9049 Acquired absence of other specified parts of digestive tract: Secondary | ICD-10-CM

## 2020-11-15 DIAGNOSIS — M810 Age-related osteoporosis without current pathological fracture: Secondary | ICD-10-CM

## 2020-11-15 DIAGNOSIS — L900 Lichen sclerosus et atrophicus: Secondary | ICD-10-CM

## 2020-11-15 DIAGNOSIS — Z8659 Personal history of other mental and behavioral disorders: Secondary | ICD-10-CM

## 2020-11-15 DIAGNOSIS — Z8639 Personal history of other endocrine, nutritional and metabolic disease: Secondary | ICD-10-CM

## 2020-12-14 ENCOUNTER — Other Ambulatory Visit: Payer: Self-pay | Admitting: *Deleted

## 2020-12-14 MED ORDER — ANASTROZOLE 1 MG PO TABS
1.0000 mg | ORAL_TABLET | Freq: Every morning | ORAL | 11 refills | Status: DC
Start: 2020-12-14 — End: 2021-12-30

## 2020-12-27 ENCOUNTER — Encounter: Payer: Self-pay | Admitting: *Deleted

## 2020-12-27 ENCOUNTER — Encounter: Payer: Self-pay | Admitting: Hematology & Oncology

## 2020-12-27 ENCOUNTER — Telehealth: Payer: Self-pay

## 2020-12-27 NOTE — Telephone Encounter (Signed)
S/w pt per sch message and she is awre of her appts   Rhonda Gutierrez

## 2021-01-01 ENCOUNTER — Other Ambulatory Visit: Payer: Self-pay

## 2021-01-01 ENCOUNTER — Inpatient Hospital Stay: Payer: Medicare Other | Attending: Hematology & Oncology

## 2021-01-01 VITALS — BP 120/65 | HR 75 | Temp 98.2°F | Resp 17

## 2021-01-01 DIAGNOSIS — D508 Other iron deficiency anemias: Secondary | ICD-10-CM

## 2021-01-01 DIAGNOSIS — D631 Anemia in chronic kidney disease: Secondary | ICD-10-CM | POA: Diagnosis present

## 2021-01-01 DIAGNOSIS — N182 Chronic kidney disease, stage 2 (mild): Secondary | ICD-10-CM | POA: Diagnosis present

## 2021-01-01 MED ORDER — SODIUM CHLORIDE 0.9 % IV SOLN
Freq: Once | INTRAVENOUS | Status: AC
Start: 2021-01-01 — End: 2021-01-01
  Filled 2021-01-01: qty 250

## 2021-01-01 MED ORDER — SODIUM CHLORIDE 0.9 % IV SOLN
125.0000 mg | Freq: Once | INTRAVENOUS | Status: AC
  Administered 2021-01-01: 125 mg via INTRAVENOUS
  Filled 2021-01-01: qty 10

## 2021-01-01 NOTE — Patient Instructions (Signed)

## 2021-01-08 ENCOUNTER — Other Ambulatory Visit: Payer: Self-pay

## 2021-01-08 ENCOUNTER — Inpatient Hospital Stay: Payer: Medicare Other

## 2021-01-08 VITALS — BP 105/74 | HR 85 | Temp 98.3°F | Resp 18

## 2021-01-08 DIAGNOSIS — N182 Chronic kidney disease, stage 2 (mild): Secondary | ICD-10-CM | POA: Diagnosis not present

## 2021-01-08 DIAGNOSIS — D508 Other iron deficiency anemias: Secondary | ICD-10-CM

## 2021-01-08 MED ORDER — SODIUM CHLORIDE 0.9 % IV SOLN
Freq: Once | INTRAVENOUS | Status: AC
  Filled 2021-01-08: qty 250

## 2021-01-08 MED ORDER — NA FERRIC GLUC CPLX IN SUCROSE 12.5 MG/ML IV SOLN
125.0000 mg | Freq: Once | INTRAVENOUS | Status: AC
  Administered 2021-01-08: 125 mg via INTRAVENOUS
  Filled 2021-01-08: qty 10

## 2021-01-08 NOTE — Patient Instructions (Signed)
Sodium Ferric Gluconate Complex injection What is this medicine? SODIUM FERRIC GLUCONATE COMPLEX (SOE dee um FER ik GLOO koe nate KOM pleks) is an iron replacement. It is used with epoetin therapy to treat low iron levels in patients who are receiving hemodialysis. This medicine may be used for other purposes; ask your health care provider or pharmacist if you have questions. COMMON BRAND NAME(S): Ferrlecit, Nulecit What should I tell my health care provider before I take this medicine? They need to know if you have any of the following conditions:  anemia that is not from iron deficiency  high levels of iron in the body  an unusual or allergic reaction to iron, benzyl alcohol, other medicines, foods, dyes, or preservatives  pregnant or are trying to become pregnant  breast-feeding How should I use this medicine? This medicine is for infusion into a vein. It is given by a health care professional in a hospital or clinic setting. Talk to your pediatrician regarding the use of this medicine in children. While this drug may be prescribed for children as young as 6 years old for selected conditions, precautions do apply. Overdosage: If you think you have taken too much of this medicine contact a poison control center or emergency room at once. NOTE: This medicine is only for you. Do not share this medicine with others. What if I miss a dose? It is important not to miss your dose. Call your doctor or health care professional if you are unable to keep an appointment. What may interact with this medicine? Do not take this medicine with any of the following medications:  deferoxamine  dimercaprol  other iron products This medicine may also interact with the following medications:  chloramphenicol  deferasirox  medicine for blood pressure like enalapril This list may not describe all possible interactions. Give your health care provider a list of all the medicines, herbs,  non-prescription drugs, or dietary supplements you use. Also tell them if you smoke, drink alcohol, or use illegal drugs. Some items may interact with your medicine. What should I watch for while using this medicine? Your condition will be monitored carefully while you are receiving this medicine. Visit your doctor for check-ups as directed. What side effects may I notice from receiving this medicine? Side effects that you should report to your doctor or health care professional as soon as possible:  allergic reactions like skin rash, itching or hives, swelling of the face, lips, or tongue  breathing problems  changes in hearing  changes in vision  chills, flushing, or sweating  fast, irregular heartbeat  feeling faint or lightheaded, falls  fever, flu-like symptoms  high or low blood pressure  pain, tingling, numbness in the hands or feet  severe pain in the chest, back, flanks, or groin  swelling of the ankles, feet, hands  trouble passing urine or change in the amount of urine  unusually weak or tired Side effects that usually do not require medical attention (report to your doctor or health care professional if they continue or are bothersome):  cramps  dark colored stools  diarrhea  headache  nausea, vomiting  stomach upset This list may not describe all possible side effects. Call your doctor for medical advice about side effects. You may report side effects to FDA at 1-800-FDA-1088. Where should I keep my medicine? This drug is given in a hospital or clinic and will not be stored at home. NOTE: This sheet is a summary. It may not cover all   possible information. If you have questions about this medicine, talk to your doctor, pharmacist, or health care provider.  2021 Elsevier/Gold Standard (2008-04-19 15:58:57)   

## 2021-03-11 ENCOUNTER — Inpatient Hospital Stay: Payer: Medicare Other | Attending: Hematology & Oncology

## 2021-03-11 ENCOUNTER — Other Ambulatory Visit: Payer: Self-pay

## 2021-03-11 ENCOUNTER — Telehealth: Payer: Self-pay

## 2021-03-11 ENCOUNTER — Encounter: Payer: Self-pay | Admitting: Hematology & Oncology

## 2021-03-11 ENCOUNTER — Inpatient Hospital Stay: Payer: Medicare Other | Admitting: Hematology & Oncology

## 2021-03-11 VITALS — BP 107/60 | HR 80 | Temp 98.4°F | Resp 20 | Wt 169.0 lb

## 2021-03-11 DIAGNOSIS — D509 Iron deficiency anemia, unspecified: Secondary | ICD-10-CM | POA: Diagnosis not present

## 2021-03-11 DIAGNOSIS — D631 Anemia in chronic kidney disease: Secondary | ICD-10-CM

## 2021-03-11 DIAGNOSIS — N182 Chronic kidney disease, stage 2 (mild): Secondary | ICD-10-CM

## 2021-03-11 DIAGNOSIS — D510 Vitamin B12 deficiency anemia due to intrinsic factor deficiency: Secondary | ICD-10-CM | POA: Diagnosis not present

## 2021-03-11 LAB — CMP (CANCER CENTER ONLY)
ALT: 25 U/L (ref 0–44)
AST: 26 U/L (ref 15–41)
Albumin: 4.4 g/dL (ref 3.5–5.0)
Alkaline Phosphatase: 117 U/L (ref 38–126)
Anion gap: 7 (ref 5–15)
BUN: 24 mg/dL — ABNORMAL HIGH (ref 6–20)
CO2: 26 mmol/L (ref 22–32)
Calcium: 10 mg/dL (ref 8.9–10.3)
Chloride: 99 mmol/L (ref 98–111)
Creatinine: 0.89 mg/dL (ref 0.44–1.00)
GFR, Estimated: >60 mL/min (ref >60)
Glucose, Bld: 354 mg/dL — ABNORMAL HIGH (ref 70–99)
Potassium: 4.4 mmol/L (ref 3.5–5.1)
Sodium: 132 mmol/L — ABNORMAL LOW (ref 135–145)
Total Bilirubin: 0.8 mg/dL (ref 0.3–1.2)
Total Protein: 7.3 g/dL (ref 6.5–8.1)

## 2021-03-11 LAB — CBC WITH DIFFERENTIAL (CANCER CENTER ONLY)
Abs Immature Granulocytes: 0.03 10*3/uL (ref 0.00–0.07)
Basophils Absolute: 0 10*3/uL (ref 0.0–0.1)
Basophils Relative: 1 %
Eosinophils Absolute: 0.4 10*3/uL (ref 0.0–0.5)
Eosinophils Relative: 5 %
HCT: 33.5 % — ABNORMAL LOW (ref 36.0–46.0)
Hemoglobin: 11 g/dL — ABNORMAL LOW (ref 12.0–15.0)
Immature Granulocytes: 1 %
Lymphocytes Relative: 16 %
Lymphs Abs: 1.1 10*3/uL (ref 0.7–4.0)
MCH: 30.1 pg (ref 26.0–34.0)
MCHC: 32.8 g/dL (ref 30.0–36.0)
MCV: 91.8 fL (ref 80.0–100.0)
Monocytes Absolute: 0.5 10*3/uL (ref 0.1–1.0)
Monocytes Relative: 7 %
Neutro Abs: 4.6 10*3/uL (ref 1.7–7.7)
Neutrophils Relative %: 70 %
Platelet Count: 171 10*3/uL (ref 150–400)
RBC: 3.65 MIL/uL — ABNORMAL LOW (ref 3.87–5.11)
RDW: 12.6 % (ref 11.5–15.5)
WBC Count: 6.6 10*3/uL (ref 4.0–10.5)
nRBC: 0 % (ref 0.0–0.2)

## 2021-03-11 LAB — SAVE SMEAR(SSMR), FOR PROVIDER SLIDE REVIEW

## 2021-03-11 LAB — RETICULOCYTES
Immature Retic Fract: 12.2 % (ref 2.3–15.9)
RBC.: 3.67 MIL/uL — ABNORMAL LOW (ref 3.87–5.11)
Retic Count, Absolute: 85.5 10*3/uL (ref 19.0–186.0)
Retic Ct Pct: 2.3 % (ref 0.4–3.1)

## 2021-03-11 NOTE — Telephone Encounter (Signed)
Appts made per 03/11/21 los and pt placed in her calendar

## 2021-03-11 NOTE — Progress Notes (Signed)
Hematology and Oncology Follow Up Visit  Rhonda Gutierrez [id number] [birth year] 60 y.o. 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 daily   Interim History:  Rhonda Gutierrez is here today for follow-up.  He is managing pretty well.  She feels okay.  I think her last iron studies were on the low side.  I think these were done by her family doctor.  We did go ahead and give her IV iron in May.  She has had no obvious bleeding issues.  She has had no change in bowel or bladder habits.  There is been no issues with cough or shortness of breath.  She has had no rashes.  She has had no leg swelling.  Her blood sugars are quite bad.  Her glucose today was 354.  She does have a continuous glucose monitor.  Apparently there is a period in which she has to put a new transducer in.  Because this, her blood sugar has been on the high side.  She has had no fever.  Overall, her performance status is ECOG 1.    Medications:  Allergies as of 03/11/2021       Reactions   Aspartame Other (See Comments)   Shaking  Shaking  Shaking  Shaking  Shaking  Shaking  Shaking  Shaking  Shaking  Shaking  Shaking  Shaking  Shaking  Shaking  Shaking  Shaking  Shaking  Shaking  Shaking    Clarithromycin Rash   Codeine Nausea And Vomiting, Nausea Only, Other (See Comments)   Mesalamine Other (See Comments)   Seen things Seen things Seen things Other reaction(s): Psychosis (intolerance) Seen things Seen things Seen things Seen things Seen  things Seen things Seen things Seen things Seen things Seen things Other reaction(s): Psychosis (intolerance) Seen things Seen things Seen things Seen things Seen things Seen things   Rofecoxib Other (See Comments), Hives   Hallucinations Other reaction(s): Psychosis (intolerance) Hallucinations Hallucinations Other reaction(s): Other, Other (See Comments), Psychosis (intolerance) Hallucinations Hallucinations Hallucinations Hallucinations Other reaction(s): Psychosis (intolerance) Hallucinations Hallucinations Hallucinations Hallucinations Other reaction(s): Psychosis (intolerance) Hallucinations Hallucinations Other reaction(s): Other, Other (See Comments), Psychosis (intolerance) Hallucinations Hallucinations Hallucinations Hallucinations Other reaction(s): Psychosis (intolerance) Hallucinations Hallucinations   Sulfasalazine Rash   fever fever fever   Fish Oil Cough, Other (See Comments)   Other reaction(s): Cough (ALLERGY/intolerance) Other reaction(s): Cough (ALLERGY/intolerance)   Losartan Cough, Other (See Comments)   Other reaction(s): Cough (ALLERGY/intolerance) Other reaction(s): Cough (ALLERGY/intolerance)   Sulfa Antibiotics Rash   fever fever fever fever fever   Sulfonamide Derivatives Rash   fever        Medication List        Accurate as of March 11, 2021  2:16 PM. If you have any questions, ask your nurse or doctor.          alendronate 70 MG tablet Commonly known as: FOSAMAX Take 70 mg by mouth once a week. Take with a full glass of water on an empty stomach.   anastrozole 1 MG tablet Commonly known as: ARIMIDEX Take 1  tablet (1 mg total) by mouth every morning.   aspirin 81 MG tablet Take 81 mg by mouth daily.   atorvastatin 80 MG tablet Commonly known as: LIPITOR Take 80 mg by mouth daily.   Black Cohosh 540 MG Caps Take 1 capsule by mouth daily.   calcium carbonate 500 MG chewable tablet Commonly known as:  TUMS - dosed in mg elemental calcium Chew 3 tablets by mouth daily.   calcium-vitamin D 500-200 MG-UNIT tablet Commonly known as: OSCAL WITH D Take 1 tablet by mouth 2 (two) times daily.   cetirizine 10 MG tablet Commonly known as: ZYRTEC Take 10 mg by mouth daily as needed.   ciprofloxacin 500 MG tablet Commonly known as: CIPRO Take 500 mg by mouth as needed (For J pouch infection).   cloNIDine 0.1 MG tablet Commonly known as: CATAPRES Take 0.1 mg by mouth at bedtime.   cyanocobalamin 1000 MCG/ML injection Commonly known as: (VITAMIN B-12) Inject into the muscle every 30 (thirty) days.   escitalopram 20 MG tablet Commonly known as: LEXAPRO Take 20 mg by mouth daily.   Evening Primrose Oil 1000 MG Caps Take 1 capsule by mouth daily.   famotidine 20 MG tablet Commonly known as: PEPCID Take 20 mg by mouth 2 (two) times daily.   fenofibrate 160 MG tablet Take 160 mg by mouth daily.   fluticasone 50 MCG/ACT nasal spray Commonly known as: FLONASE Place into both nostrils daily.   folic acid 1 MG tablet Commonly known as: FOLVITE Take 1 mg by mouth daily.   gabapentin 600 MG tablet Commonly known as: NEURONTIN Take 600 mg by mouth 3 (three) times daily.   insulin lispro 100 UNIT/ML KiwkPen Commonly known as: HumaLOG KwikPen Use as directed if pump does not work   HumaLOG 100 UNIT/ML injection Generic drug: insulin lispro USE IN INSULIN PUMP FOR A TOTAL OF 230 UNITS PER DAY   loperamide 2 MG capsule Commonly known as: IMODIUM Take 6 mg by mouth 2 (two) times daily.   LORazepam 1 MG tablet Commonly known as: ATIVAN Take 1 mg by mouth as needed for anxiety.   Magnesium Oxide 400 MG Caps   Melatonin 10 MG Caps Take 10 mg by mouth.   metroNIDAZOLE 500 MG tablet Commonly known as: FLAGYL Take 500 mg by mouth as needed.   multivitamin tablet Take 1 tablet by mouth daily.   nitroGLYCERIN 0.4 mg/hr patch Commonly known as: NITRODUR - Dosed in mg/24  hr Place 0.4 mg onto the skin as needed.   omeprazole 40 MG capsule Commonly known as: PRILOSEC Take 40 mg by mouth daily.   ondansetron 8 MG tablet Commonly known as: ZOFRAN Take 8 mg by mouth daily as needed.   pantoprazole 40 MG tablet Commonly known as: PROTONIX Take by mouth.   ramipril 2.5 MG capsule Commonly known as: ALTACE Take 2.5 mg by mouth 2 (two) times daily. Further refills through PCP.   simethicone 125 MG chewable tablet Commonly known as: MYLICON Chew 161 mg by mouth as needed for flatulence.   tretinoin 0.05 % cream Commonly known as: RETIN-A APPLY ONE APPLICATION EVERY NIGHT AT BEDTIME.   Vitamin D3 25 MCG (1000 UT) Caps Take 1,000 Units by mouth daily.        Allergies:  Allergies  Allergen Reactions   Aspartame Other (See Comments)    Shaking  Shaking  Shaking  Shaking  Shaking  Shaking  Shaking  Shaking  Shaking   Shaking  Shaking  Shaking  Shaking  Shaking  Shaking  Shaking  Shaking  Shaking  Shaking    Clarithromycin Rash   Codeine Nausea And Vomiting, Nausea Only and Other (See Comments)   Mesalamine Other (See Comments)    Seen things Seen things Seen things Other reaction(s): Psychosis (intolerance) Seen things Seen things Seen things Seen things Seen things Seen things  Seen things Seen things Seen things Seen things Other reaction(s): Psychosis (intolerance) Seen things Seen things Seen things Seen things Seen things Seen things   Rofecoxib Other (See Comments) and Hives    Hallucinations  Other reaction(s): Psychosis (intolerance) Hallucinations Hallucinations Other reaction(s): Other, Other (See Comments), Psychosis (intolerance) Hallucinations Hallucinations Hallucinations Hallucinations  Other reaction(s): Psychosis (intolerance) Hallucinations Hallucinations  Hallucinations Hallucinations  Other reaction(s): Psychosis (intolerance) Hallucinations Hallucinations Other  reaction(s): Other, Other (See Comments), Psychosis (intolerance) Hallucinations Hallucinations Hallucinations Hallucinations  Other reaction(s): Psychosis (intolerance) Hallucinations Hallucinations   Sulfasalazine Rash    fever fever fever   Fish Oil Cough and Other (See Comments)    Other reaction(s): Cough (ALLERGY/intolerance) Other reaction(s): Cough (ALLERGY/intolerance)   Losartan Cough and Other (See Comments)    Other reaction(s): Cough (ALLERGY/intolerance) Other reaction(s): Cough (ALLERGY/intolerance)   Sulfa Antibiotics Rash    fever  fever fever  fever fever   Sulfonamide Derivatives Rash    fever    Past Medical History, Surgical history, Social history, and Family History were reviewed and updated.  Review of Systems: Review of Systems  Constitutional:  Positive for malaise/fatigue.  HENT: Negative.    Eyes: Negative.   Respiratory: Negative.    Cardiovascular: Negative.   Gastrointestinal: Negative.   Genitourinary: Negative.   Musculoskeletal: Negative.   Skin: Negative.   Neurological: Negative.   Endo/Heme/Allergies: Negative.   Psychiatric/Behavioral: Negative.      Physical Exam:  weight is 169 lb (76.7 kg). Her oral temperature is 98.4 F (36.9 C). Her blood pressure is 107/60 and her pulse is 80. Her respiration is 20 and oxygen saturation is 99%.   Wt Readings from Last 3 Encounters:  03/11/21 169 lb (76.7 kg)  09/14/20 161 lb (73 kg)  05/17/20 165 lb 3.2 oz (74.9 kg)    Physical Exam Vitals reviewed.  Constitutional:      Comments: Her breast exam shows right breast no masses, edema or erythema.  There is no right axillary adenopathy.  Left breast shows the lumpectomy scar at about the 2 o'clock position.  This is well-healed.  There is some slight contraction of the left breast from radiation.  She has a well-healed lumpectomy scar.  There is no fullness in the left axilla.  HENT:     Head: Normocephalic and atraumatic.   Eyes:     Pupils: Pupils are equal, round, and reactive to light.  Cardiovascular:     Rate and Rhythm: Normal rate and regular rhythm.     Heart sounds: Normal heart sounds.  Pulmonary:     Effort: Pulmonary effort is normal.     Breath sounds: Normal breath sounds.  Abdominal:     General: Bowel sounds are normal.     Palpations: Abdomen is soft.  Musculoskeletal:        General: No tenderness or deformity. Normal range of motion.     Cervical back: Normal range of motion.  Lymphadenopathy:     Cervical: No cervical adenopathy.  Skin:    General: Skin is warm and dry.     Findings: No erythema or rash.  Neurological:  Mental Status: She is alert and oriented to person, place, and time.  Psychiatric:        Behavior: Behavior normal.        Thought Content: Thought content normal.        Judgment: Judgment normal.     Lab Results  Component Value Date   WBC 6.6 03/11/2021   HGB 11.0 (L) 03/11/2021   HCT 33.5 (L) 03/11/2021   MCV 91.8 03/11/2021   PLT 171 03/11/2021   Lab Results  Component Value Date   FERRITIN 319 (H) 09/14/2020   IRON 72 09/14/2020   TIBC 409 09/14/2020   UIBC 337 09/14/2020   IRONPCTSAT 18 09/14/2020   Lab Results  Component Value Date   RETICCTPCT 2.3 03/11/2021   RBC 3.67 (L) 03/11/2021   RBC 3.65 (L) 03/11/2021   No results found for: KPAFRELGTCHN, LAMBDASER, KAPLAMBRATIO No results found for: IGGSERUM, IGA, IGMSERUM No results found for: Ronnald Ramp, A1GS, A2GS, Violet Baldy, MSPIKE, SPEI   Chemistry      Component Value Date/Time   NA 132 (L) 03/11/2021 1313   K 4.4 03/11/2021 1313   CL 99 03/11/2021 1313   CO2 26 03/11/2021 1313   BUN 24 (H) 03/11/2021 1313   CREATININE 0.89 03/11/2021 1313      Component Value Date/Time   CALCIUM 10.0 03/11/2021 1313   CALCIUM 9.7 04/08/2011 1053   ALKPHOS 117 03/11/2021 1313   AST 26 03/11/2021 1313   ALT 25 03/11/2021 1313   BILITOT 0.8 03/11/2021 1313       Impression and Plan: Rhonda Gutierrez is a very pleasant 60 yo caucasian female with 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  Her recent problem is the early stage-stage I-infiltrating ductal carcinoma of the left breast.  This was found incidentally.  She underwent a lumpectomy and had radiation therapy.  All this was completed in April 2021.  She is on Arimidex.  She is doing well.  Her problem has been the iron deficiency.  We will have to see what her iron levels are.  Her hemoglobin so it is not come up all that much.  I would like to have her come back in 6 months.  I think this would be reasonable.  She is quite busy this summer.  Sound like she does yard work.  She has landscaping business.  As always, we talked about the winter of the [employer]  It was a blood hound named Trumpet.  Volanda Napoleon, MD 7/11/20222:16 PM

## 2021-03-12 ENCOUNTER — Encounter: Payer: Self-pay | Admitting: *Deleted

## 2021-03-12 LAB — IRON AND TIBC
Iron: 45 ug/dL (ref 41–142)
Saturation Ratios: 12 % — ABNORMAL LOW (ref 21–57)
TIBC: 383 ug/dL (ref 236–444)
UIBC: 337 ug/dL (ref 120–384)

## 2021-03-12 LAB — FERRITIN: Ferritin: 224 ng/mL (ref 11–307)

## 2021-03-13 ENCOUNTER — Other Ambulatory Visit: Payer: Self-pay

## 2021-03-13 ENCOUNTER — Inpatient Hospital Stay: Payer: Medicare Other

## 2021-03-13 ENCOUNTER — Other Ambulatory Visit (HOSPITAL_BASED_OUTPATIENT_CLINIC_OR_DEPARTMENT_OTHER): Payer: Self-pay

## 2021-03-13 VITALS — BP 119/61 | HR 74 | Resp 17

## 2021-03-13 DIAGNOSIS — D508 Other iron deficiency anemias: Secondary | ICD-10-CM

## 2021-03-13 DIAGNOSIS — N182 Chronic kidney disease, stage 2 (mild): Secondary | ICD-10-CM | POA: Diagnosis not present

## 2021-03-13 DIAGNOSIS — G4733 Obstructive sleep apnea (adult) (pediatric): Secondary | ICD-10-CM

## 2021-03-13 MED ORDER — SODIUM CHLORIDE 0.9 % IV SOLN
Freq: Once | INTRAVENOUS | Status: AC
  Filled 2021-03-13: qty 250

## 2021-03-13 MED ORDER — SODIUM CHLORIDE 0.9 % IV SOLN
125.0000 mg | Freq: Once | INTRAVENOUS | Status: AC
  Administered 2021-03-13: 125 mg via INTRAVENOUS
  Filled 2021-03-13: qty 10

## 2021-03-13 NOTE — Patient Instructions (Signed)
Sodium Ferric Gluconate Complex injection What is this medicine? SODIUM FERRIC GLUCONATE COMPLEX (SOE dee um FER ik GLOO koe nate KOM pleks) is an iron replacement. It is used with epoetin therapy to treat low iron levels in patients who are receiving hemodialysis. This medicine may be used for other purposes; ask your health care provider or pharmacist if you have questions. COMMON BRAND NAME(S): Ferrlecit, Nulecit What should I tell my health care provider before I take this medicine? They need to know if you have any of the following conditions:  anemia that is not from iron deficiency  high levels of iron in the body  an unusual or allergic reaction to iron, benzyl alcohol, other medicines, foods, dyes, or preservatives  pregnant or are trying to become pregnant  breast-feeding How should I use this medicine? This medicine is for infusion into a vein. It is given by a health care professional in a hospital or clinic setting. Talk to your pediatrician regarding the use of this medicine in children. While this drug may be prescribed for children as young as 6 years old for selected conditions, precautions do apply. Overdosage: If you think you have taken too much of this medicine contact a poison control center or emergency room at once. NOTE: This medicine is only for you. Do not share this medicine with others. What if I miss a dose? It is important not to miss your dose. Call your doctor or health care professional if you are unable to keep an appointment. What may interact with this medicine? Do not take this medicine with any of the following medications:  deferoxamine  dimercaprol  other iron products This medicine may also interact with the following medications:  chloramphenicol  deferasirox  medicine for blood pressure like enalapril This list may not describe all possible interactions. Give your health care provider a list of all the medicines, herbs,  non-prescription drugs, or dietary supplements you use. Also tell them if you smoke, drink alcohol, or use illegal drugs. Some items may interact with your medicine. What should I watch for while using this medicine? Your condition will be monitored carefully while you are receiving this medicine. Visit your doctor for check-ups as directed. What side effects may I notice from receiving this medicine? Side effects that you should report to your doctor or health care professional as soon as possible:  allergic reactions like skin rash, itching or hives, swelling of the face, lips, or tongue  breathing problems  changes in hearing  changes in vision  chills, flushing, or sweating  fast, irregular heartbeat  feeling faint or lightheaded, falls  fever, flu-like symptoms  high or low blood pressure  pain, tingling, numbness in the hands or feet  severe pain in the chest, back, flanks, or groin  swelling of the ankles, feet, hands  trouble passing urine or change in the amount of urine  unusually weak or tired Side effects that usually do not require medical attention (report to your doctor or health care professional if they continue or are bothersome):  cramps  dark colored stools  diarrhea  headache  nausea, vomiting  stomach upset This list may not describe all possible side effects. Call your doctor for medical advice about side effects. You may report side effects to FDA at 1-800-FDA-1088. Where should I keep my medicine? This drug is given in a hospital or clinic and will not be stored at home. NOTE: This sheet is a summary. It may not cover all   possible information. If you have questions about this medicine, talk to your doctor, pharmacist, or health care provider.  2021 Elsevier/Gold Standard (2008-04-19 15:58:57)   

## 2021-04-01 ENCOUNTER — Encounter: Payer: Self-pay | Admitting: Dietician

## 2021-04-01 ENCOUNTER — Encounter: Payer: Medicare Other | Attending: Internal Medicine | Admitting: Dietician

## 2021-04-01 ENCOUNTER — Other Ambulatory Visit: Payer: Self-pay

## 2021-04-01 DIAGNOSIS — E1069 Type 1 diabetes mellitus with other specified complication: Secondary | ICD-10-CM | POA: Diagnosis not present

## 2021-04-01 NOTE — Patient Instructions (Signed)
Recommend reading labels for phosphorous ingredients.  This is used as a preservative and readily absorbable by the body.  Recommend choosing beverages without any Phos... ingredients.   Calorie [employer]- consider checking the nutrient data to help make food  decisions.

## 2021-04-01 NOTE — Progress Notes (Signed)
Diabetes Self-Management Education  Visit Type: First/Initial  Appt. Start Time: 1100 Appt. End Time: 1215  04/01/2021  Rhonda Gutierrez, identified by name and date of birth, is a 60 y.o. female with a diagnosis of Diabetes: Type 1.   ASSESSMENT Patient is here today alone. The CDCES that she has worked with for years at [facility] recently, passed away.  She is here to establish herself with another CDCES. Patient downloaded the Calorie [place] app during the visit today and verbalized concerns of foods eaten out.    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  Type 1 diabetes( 2007) (originally thought to be type 2 diabetes).  Ileostomy reversal 06/2026.  Now with 10-12 diarrhea stools daily.  She follows a low fiber diet due to this. HTN, CKD stage 2, OSA on C-pap, Fatty liver, anemia, breast cancer (2020 (now in remission), plantar fascitis.  She uses a Tandem insulin pump and Dexcom G6 CGM.  She now has her pump set up on the app. Labs noted to include:  A1C 7.2% 02/18/2021 increased form 6.8% 11/16/2020  Allergies include:  Aspartame (shaking).  Patient has been unknowingly using. Medications include:  Humalog via pump Supplements include:  vitamin B-12 injections, iron infusions, folic acid, calcium with vitamin D3, Vitamin D3, MVI, simethicone prn Stress overall moderate.  Notes that stress causes her blood sugar to go very high.  Patient lives with her husband and daughter.  Her daughter is a vegan and cooks for herself.  Patient does the shopping and cooking for her and her husband.  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 Noted concern of food running out before money to buy more indicated on intake form. She is interested in the type 1 support group and feels quite lonely.  I have added her to the type 2 diabetes support group list.     Diabetes Self-Management Education - 04/01/21 1129       Visit Information   Visit Type First/Initial      Initial Visit   Diabetes Type Type 1    Are you currently following a meal plan? No    Are you taking your medications as prescribed? Yes    Date Diagnosed 2007      Psychosocial Assessment   Patient Belief/Attitude about Diabetes Motivated to manage diabetes    Self-care barriers None    Self-management support Doctor's office    Other persons present Patient    Patient Concerns Nutrition/Meal planning;Other (comment)   establish a CDCES   Special Needs None    Preferred Learning Style No preference indicated    Learning Readiness Ready    How often do you need to have someone help you when you read instructions, pamphlets, or other written materials from your doctor or pharmacy? 1 - Never    What is the last grade level you completed in school? 12      Pre-Education Assessment   Patient understands the diabetes disease and treatment process. Demonstrates understanding / competency    Patient understands incorporating nutritional management into lifestyle. Needs Review    Patient undertands incorporating physical activity into lifestyle. Demonstrates understanding / competency    Patient understands using medications safely. Demonstrates understanding / competency    Patient understands monitoring blood glucose, interpreting and using results Demonstrates understanding / competency    Patient understands prevention, detection,  and treatment of acute complications. Demonstrates understanding / competency    Patient understands prevention, detection, and treatment of chronic complications. Demonstrates understanding / competency    Patient understands how to develop strategies to address psychosocial issues. Demonstrates understanding / competency    Patient understands how to develop strategies to promote health/change behavior. Needs Review      Complications   Last HgB A1C  per patient/outside source 7.2 %   02/18/2021 increased from 6.8% 11/16/2020   How often do you check your blood sugar? > 4 times/day   Dexcom G6   Fasting Blood glucose range (mg/dL) 70-129    Postprandial Blood glucose range (mg/dL) 180-200;130-179    Number of hypoglycemic episodes per month 15    Can you tell when your blood sugar is low? Yes    What do you do if your blood sugar is low? juice    Have you had a dilated eye exam in the past 12 months? Yes    Have you had a dental exam in the past 12 months? Yes    Are you checking your feet? Yes    How many days per week are you checking your feet? 7      Dietary Intake   Breakfast 2 small cups coffee with Splenda    Snack (morning) none    Lunch sandwich on white bread, chips or pretzel mix OR occasional out to eat - [employer], soup, salad, breadsticks    Snack (afternoon) none    Dinner [place] or chicken or pork, asian sauce, white rice, vegetables    Snack (evening) raw carrots, greek cucumber sauce    Beverage(s) water, coffee with Splenda, unsweetened tea with splenda      Exercise   Exercise Type Light (walking / raking leaves)   varied     Patient Education   Previous Diabetes Education Yes (please comment)   2020-11-28 (CDCES passed away)   Nutrition management  Food label reading, portion sizes and measuring food.;Carbohydrate counting;Meal options for control of blood glucose level and chronic complications.    Medications Reviewed patients medication for diabetes, action, purpose, timing of dose and side effects.      Individualized Goals (developed by patient)   Nutrition Other (comment)   carb counting, awareness of options   Physical Activity Exercise 3-5 times per week;30 minutes per day    Medications take my medication as prescribed    Monitoring  test my blood glucose as discussed    Reducing Risk increase portions of healthy fats      Post-Education Assessment   Patient understands the diabetes disease and  treatment process. Demonstrates understanding / competency    Patient understands incorporating nutritional management into lifestyle. Demonstrates understanding / competency    Patient undertands incorporating physical activity into lifestyle. Demonstrates understanding / competency    Patient understands using medications safely. Demonstrates understanding / competency    Patient understands monitoring blood glucose, interpreting and using results Demonstrates understanding / competency    Patient understands prevention, detection, and treatment of acute complications. Demonstrates understanding / competency    Patient understands prevention, detection, and treatment of chronic complications. Demonstrates understanding / competency    Patient understands how to develop strategies to address psychosocial issues. Demonstrates understanding / competency    Patient understands how to develop strategies to promote health/change behavior. Needs Review      Outcomes   Expected Outcomes Demonstrated interest in learning. Expect positive outcomes    Future DMSE  PRN    Program Status Completed             Individualized Plan for Diabetes Self-Management Training:   Learning Objective:  Patient will have a greater understanding of diabetes self-management. Patient education plan is to attend individual and/or group sessions per assessed needs and concerns.   Plan:   Patient Instructions  Recommend reading labels for phosphorous ingredients.  This is used as a preservative and readily absorbable by the body.  Recommend choosing beverages without any Phos... ingredients.   Calorie [employer]- consider checking the nutrient data to help make food  decisions.  Expected Outcomes:  Demonstrated interest in learning. Expect positive outcomes  Education material provided: Meal plan card  If problems or questions, patient to contact team via:  Phone  Future DSME appointment: PRN

## 2021-04-28 ENCOUNTER — Other Ambulatory Visit: Payer: Self-pay

## 2021-04-28 ENCOUNTER — Ambulatory Visit (HOSPITAL_BASED_OUTPATIENT_CLINIC_OR_DEPARTMENT_OTHER): Payer: Medicare Other | Attending: Internal Medicine | Admitting: Internal Medicine

## 2021-04-28 DIAGNOSIS — G4733 Obstructive sleep apnea (adult) (pediatric): Secondary | ICD-10-CM

## 2021-04-30 NOTE — Procedures (Signed)
   NAME: Rhonda Gutierrez DATE OF BIRTH:  May 12, 1961 MEDICAL RECORD NUMBER [id number]  LOCATION: [facility]  PHYSICIAN: Marius Ditch  DATE OF STUDY: 04/28/2021  SLEEP STUDY TYPE: Positive Airway Pressure Titration               REFERRING PHYSICIAN: Marius Ditch, MD  EPWORTH SLEEPINESS SCORE:   HEIGHT: 5' 2"  (157.5 cm)  WEIGHT: 165 lb (74.8 kg)    Body mass index is 30.18 kg/m.  NECK SIZE: 15 in.  CLINICAL INFORMATION The patient was referred to the sleep center for PAP titration. She has moderate OSA on HSAT in April 2022. She tried some nasal pillow masks and was not able to be successful. This test done to evaluate best mask strategy for patient  MEDICATIONS Patient self administered medications include: N/A. Medications administered during study include No sleep medicine administered.Marland Kitchen  SLEEP STUDY TECHNIQUE The patient underwent an attended overnight polysomnography titration to assess the effects of cpap therapy. The following variables were monitored: EEG(C4-A1, C3-A2, O1-A2, O2-A1), EOG, submental and leg EMG, ECG, oxyhemoglobin saturation by pulse oximetry, thoracic and abdominal respiratory effort belts, nasal/oral airflow by pressure sensor, body position sensor and snoring sensor. CPAP pressure was titrated to eliminate apneas, hypopneas and oxygen desaturation.  TECHNICAL COMMENTS Comments added by Technician: None Comments added by Scorer: N/A  SLEEP ARCHITECTURE The study was initiated at 10:47:33 PM and terminated at 4:50:09 AM. Total recorded time was 362.6 minutes. EEG confirmed total sleep time was 336.5 minutes yielding a sleep efficiency of 92.8%%. Sleep onset after lights out was 1.8 minutes with a REM latency of 197.5 minutes. The patient spent 4.9%% of the night in stage N1 sleep, 82.9%% in stage N2 sleep, 0.0%% in stage N3 and 12.2% in REM. The Arousal Index was 3.9/hour.  RESPIRATORY PARAMETERS The overall AHI was 0.7 per hour,  and the RDI was 1.2 events/hour with a central apnea index of 0 events/hour. The most appropriate setting of CPAP was IPAP/EPAP 14/14 cm H2O. At this setting, the sleep efficiency was 89%, the patient was supine for 100%, the AHI was 0 events per hour, the RDI was 0 events/hour (with 0 central events) and the arousal index was 3.5 per hour. At optimal presssure, the oxygen nadir was 94.0%.  The patient stated that her sleep during this test was better than her sleep at home.   LEG MOVEMENT DATA The total leg movements were 0 with a resulting leg movement index of 0.0/hr. Associated arousal with leg movement index was 0.0/hr.  CARDIAC DATA The underlying cardiac rhythm was most consistent with sinus rhythm. Mean heart rate during sleep was 65.8 bpm. Additional rhythm abnormalities include None.  IMPRESSIONS - Moderate Obstructive Sleep Apnea (OSA). Optimal pressure attained. - No significant periodic leg movements(PLMs) during sleep. However, no significant associated arousals.  DIAGNOSIS - Obstructive Sleep Apnea (G47.33)  RECOMMENDATIONS - Trial of CPAP therapy on 14 cm H2O (or APAP 10-16) with a Small size Fisher&Paykel Full Face Mask F&P Vitera (new) mask and heated humidification.  Marius Ditch Sleep specialist, [place] [employer]  ELECTRONICALLY SIGNED ON:  04/30/2021, 8:03 AM [place] PH: [phone]   FX: [phone] [place]

## 2021-06-13 ENCOUNTER — Encounter: Payer: Self-pay | Admitting: Hematology & Oncology

## 2021-06-13 NOTE — Telephone Encounter (Signed)
Spoke with Dr Marin Olp who stated her iron saturation level is low at 9. Hgb is ok at 10.8. He recommends one dose of Ferrlecit. Called pt and advised her of this and she agreed. Message sent to scheduling to call for apt.

## 2021-06-18 ENCOUNTER — Inpatient Hospital Stay: Payer: Medicare Other | Attending: Hematology & Oncology

## 2021-06-18 ENCOUNTER — Other Ambulatory Visit: Payer: Self-pay

## 2021-06-18 VITALS — BP 127/62 | HR 72 | Temp 98.1°F | Resp 18

## 2021-06-18 DIAGNOSIS — D631 Anemia in chronic kidney disease: Secondary | ICD-10-CM | POA: Diagnosis present

## 2021-06-18 DIAGNOSIS — N189 Chronic kidney disease, unspecified: Secondary | ICD-10-CM | POA: Insufficient documentation

## 2021-06-18 DIAGNOSIS — D508 Other iron deficiency anemias: Secondary | ICD-10-CM

## 2021-06-18 MED ORDER — SODIUM CHLORIDE 0.9 % IV SOLN
125.0000 mg | Freq: Once | INTRAVENOUS | Status: AC
  Administered 2021-06-18: 125 mg via INTRAVENOUS
  Filled 2021-06-18: qty 10

## 2021-06-18 MED ORDER — SODIUM CHLORIDE 0.9 % IV SOLN: Freq: Once | INTRAVENOUS | Status: AC

## 2021-06-18 NOTE — Patient Instructions (Signed)
Sodium Ferric Gluconate Complex Injection What is this medication? SODIUM FERRIC GLUCONATE COMPLEX (SOE dee um FER ik GLOO koe nate KOM pleks) treats low levels of iron (iron deficiency anemia) in people with kidney disease. Iron is a mineral that plays an important role in making red blood cells, which carry oxygen from your lungs to the rest of your body. This medicine may be used for other purposes; ask your health care provider or pharmacist if you have questions. COMMON BRAND NAME(S): Ferrlecit, Nulecit What should I tell my care team before I take this medication? They need to know if you have any of the following conditions: Anemia that is not from iron deficiency High levels of iron in the blood An unusual or allergic reaction to iron, other medications, foods, dyes, or preservatives Pregnant or are trying to become pregnant Breast-feeding How should I use this medication? This medication is injected into a vein. It is given by your care team in a hospital or clinic setting. Talk to your care team about the use of this medication in children. While it may be prescribed for children as young as 6 years for selected conditions, precautions do apply. Overdosage: If you think you have taken too much of this medicine contact a poison control center or emergency room at once. NOTE: This medicine is only for you. Do not share this medicine with others. What if I miss a dose? It is important not to miss your dose. Call your care team if you are unable to keep an appointment. What may interact with this medication? Do not take this medication with any of the following: Deferasirox Deferoxamine Dimercaprol This medication may also interact with the following: Other iron products This list may not describe all possible interactions. Give your health care provider a list of all the medicines, herbs, non-prescription drugs, or dietary supplements you use. Also tell them if you smoke, drink  alcohol, or use illegal drugs. Some items may interact with your medicine. What should I watch for while using this medication? Your condition will be monitored carefully while you are receiving this medication. Visit your care team for regular checks on your progress. You may need blood work while you are taking this medication. What side effects may I notice from receiving this medication? Side effects that you should report to your care team as soon as possible: Allergic reactions-skin rash, itching, hives, swelling of the face, lips, tongue, or throat Low blood pressure-dizziness, feeling faint or lightheaded, blurry vision Shortness of breath Side effects that usually do not require medical attention (report to your care team if they continue or are bothersome): Flushing Headache Joint pain Muscle pain Nausea Pain, redness, or irritation at injection site This list may not describe all possible side effects. Call your doctor for medical advice about side effects. You may report side effects to FDA at 1-800-FDA-1088. Where should I keep my medication? This medication is given in a hospital or clinic and will not be stored at home. NOTE: This sheet is a summary. It may not cover all possible information. If you have questions about this medicine, talk to your doctor, pharmacist, or health care provider.  2022 Elsevier/Gold Standard (2020-10-01 11:46:34)

## 2021-07-15 NOTE — Progress Notes (Signed)
Office Visit Note  Patient: Rhonda Gutierrez             Date of Birth: [birth year]           MRN: [id number]             PCP: Oren Section, NP-C Referring: Oren Section, NP-C Visit Date: 07/29/2021 Occupation: @GUAROCC @  Subjective:  Right middle finger pain.   History of Present Illness: Rhonda Gutierrez is a 60 y.o. female with a history of osteoarthritis and degenerative disc disease.  She states she had a good response to right middle trigger finger injection.  Recently she has been having discomfort in her right middle finger which she describes over the right third PIP joint.  She has not noticed any joint swelling.  She is off-and-on discomfort in her knee joints.  She has noticed redness in her neck.  None of the other joints are painful.    Activities of Daily Living:  Patient reports morning stiffness for 15 minutes.   Patient Reports nocturnal pain.  Difficulty dressing/grooming: Denies Difficulty climbing stairs: Denies Difficulty getting out of chair: Denies Difficulty using hands for taps, buttons, cutlery, and/or writing: Denies  Review of Systems  Constitutional:  Positive for fatigue.  HENT:  Positive for nose dryness. Negative for mouth sores and mouth dryness.   Eyes:  Positive for dryness. Negative for pain and itching.  Respiratory:  Negative for shortness of breath and difficulty breathing.   Cardiovascular:  Negative for chest pain and palpitations.  Gastrointestinal:  Negative for blood in stool, constipation and diarrhea.  Endocrine: Negative for increased urination.  Genitourinary:  Negative for difficulty urinating.  Musculoskeletal:  Positive for joint pain, joint pain, joint swelling and morning stiffness. Negative for myalgias, muscle tenderness and myalgias.  Skin:  Negative for color change, rash and redness.  Allergic/Immunologic: Negative for susceptible to infections.  Neurological:  Negative for dizziness, numbness, headaches, memory  loss and weakness.  Hematological:  Positive for anemia.  Psychiatric/Behavioral:  Negative for confusion.    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   IDA (iron deficiency anemia) 11/05/2017   Primary osteoarthritis of both hands 07/10/2017   Primary osteoarthritis of both knees 07/10/2017   Plantar fasciitis 07/10/2017   DDD (degenerative disc disease), cervical 07/10/2017   Vitamin D deficiency 07/10/2017   Abnormal laboratory test 07/10/2017   Gastroenteritis presumed infectious 08/12/2015   Nausea vomiting and diarrhea 07/30/2015   ARF (acute renal failure) ([place]) 07/30/2015   Diarrhea 07/30/2015   Acute kidney injury ([place]) 07/29/2015   Pain in joint, ankle and foot 05/03/2013   Type I (juvenile type) diabetes mellitus with renal manifestations, not stated as uncontrolled(250.41) 01/11/2013   Carpal tunnel syndrome, bilateral 10/07/2011   Cough 07/09/2010   PROTEINURIA, MILD 04/10/2009   DISTURBANCE OF SKIN SENSATION 01/02/2009   HYPERLIPIDEMIA 11/21/2008   RENAL INSUFFICIENCY 11/21/2008   Ulcerative colitis ([place]) 03/06/2008   FATTY LIVER DISEASE 03/06/2008   Essential hypertension 04/14/2007    Past Medical History:  Diagnosis Date   Dyslipidemia    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   Fatty liver disease, nonalcoholic    Type I (juvenile type) diabetes mellitus without mention of complication, not stated as uncontrolled    Ulcerative colitis, unspecified    Unspecified essential hypertension  Family History  Problem Relation Age of Onset   Cancer Father    Allergy (severe) Daughter    Past Surgical History:  Procedure Laterality Date   APPENDECTOMY     BREAST LUMPECTOMY WITH SENTINEL LYMPH NODE BIOPSY Left 09/27/2019   CARPAL TUNNEL RELEASE Right 09/2011    CARPAL TUNNEL RELEASE Left 07/2016   CERVICAL DISCECTOMY  07/02/2018   c6,c7   CHOLECYSTECTOMY  1993   CYST EXCISION  12/2011   benign cyst removed from right forearm   ILEOSTOMY  06/17/2006   take down   Insulin pump     mass removal      right arm, benign   NASAL SINUS SURGERY  02/10/1994   PROTOCOLECTOMY  12/08/2005   total   stoma  2007   reversal of ileostomy   [place]   Social History   Social History Narrative   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 She believes that when she returns to work with much more activity, her glucoses will come down some more.   Immunization History  Administered Date(s) Administered   Influenza Inj Mdck Quad Pf 05/28/2017   Influenza Split 07/08/2011   Influenza Whole 07/02/2008, 06/01/2013   Influenza,inj,Quad PF,6+ Mos 05/22/2019   Influenza-Unspecified 07/08/2011, 05/28/2017, 04/25/2020   PFIZER(Purple Top)SARS-COV-2 Vaccination 11/11/2019, 12/02/2019   Pneumococcal Polysaccharide-23 06/01/2006, 06/01/2013     Objective: Vital Signs: BP 132/80 (BP Location: Right Arm, Patient Position: Sitting, Cuff Size: Normal)   Pulse 74   Ht 5' 2"  (1.575 m)   Wt 172 lb 3.2 oz (78.1 kg)   BMI 31.50 kg/m    Physical Exam Vitals and nursing note reviewed.  Constitutional:      Appearance: She is well-developed.  HENT:     Head: Normocephalic and atraumatic.  Eyes:     Conjunctiva/sclera: Conjunctivae normal.  Cardiovascular:     Rate and Rhythm: Normal rate and regular rhythm.     Heart sounds: Normal heart sounds.  Pulmonary:     Effort: Pulmonary effort is normal.     Breath sounds: Normal breath sounds.  Abdominal:     General: Bowel sounds are normal.     Palpations: Abdomen is soft.  Musculoskeletal:     Cervical back: Normal range of motion.  Lymphadenopathy:     Cervical: No cervical adenopathy.  Skin:    General: Skin is warm and dry.      Capillary Refill: Capillary refill takes less than 2 seconds.  Neurological:     Mental Status: She is alert and oriented to person, place, and time.  Psychiatric:        Behavior: Behavior normal.     Musculoskeletal Exam: C-spine was in good range of motion.  Shoulder joints, elbow joints, wrist joints were in good range of motion.  She had bilateral PIP and DIP thickening with no synovitis.  She had no tenosynovitis.  Hip joints and knee joints in good range of motion.  There was no tenderness over ankles or MTPs.  CDAI Exam: CDAI Score: -- Patient Global: --; Provider Global: -- Swollen: --; Tender: -- Joint Exam 07/29/2021   No joint exam has been documented for this visit   There is currently no information documented on the homunculus. Go to the Rheumatology activity and complete the homunculus joint exam.  Investigation: No additional findings.  Imaging: No results found.  Recent Labs: Lab Results  Component  Value Date   WBC 6.6 03/11/2021   HGB 11.0 (L) 03/11/2021   PLT 171 03/11/2021   NA 132 (L) 03/11/2021   K 4.4 03/11/2021   CL 99 03/11/2021   CO2 26 03/11/2021   GLUCOSE 354 (H) 03/11/2021   BUN 24 (H) 03/11/2021   CREATININE 0.89 03/11/2021   BILITOT 0.8 03/11/2021   ALKPHOS 117 03/11/2021   AST 26 03/11/2021   ALT 25 03/11/2021   PROT 7.3 03/11/2021   ALBUMIN 4.4 03/11/2021   CALCIUM 10.0 03/11/2021   GFRAA >60 05/15/2020    Speciality Comments: No specialty comments available.  Procedures:  No procedures performed Allergies: Aspartame, Clarithromycin, Codeine, Mesalamine, Rofecoxib, Sulfasalazine, Fish oil, Losartan, Sulfa antibiotics, and Sulfonamide derivatives   Assessment / Plan:     Visit Diagnoses: Primary osteoarthritis of both hands-she has severe osteoarthritis in her bilateral hands with PIP and DIP thickening.  She had tenderness on palpation of her right third PIP joint.  No synovitis was noted.  Joint protection muscle strengthening  was discussed.  A handout on hand exercises was given.  I advised her to contact me in case her symptoms persist.  I also offered physical therapy and Occupational Therapy.  She states her daughter is graduating from physical therapy and she will learn some exercises from her.  She is moving to [place] permanently next year.  Trigger finger, right middle finger - injected 09/05/2020.  She had good response to the injection and currently is asymptomatic.  Primary osteoarthritis of both knees-lower extremity muscle strengthening exercises were discussed.  DDD (degenerative disc disease), cervical - s/p discectomy.  She continues to have some stiffness.  Myofascial pain-she has bilateral trapezius spasm.  Stretching exercises were demonstrated.  Age-related osteoporosis without current pathological fracture - Fosamax, calcium and vitamin D.    History of vitamin D deficiency  Other ulcerative colitis without complication ([place])  History of colectomy  History of fatty infiltration of liver  History of hyperlipidemia  History of diabetes mellitus  History of chronic kidney disease/ Dr Brayton El manages   History of gastroesophageal reflux (GERD)  History of anxiety  History of hypertension  Lichen sclerosus et atrophicus - Left breast and right thigh per patient.  Orders: No orders of the defined types were placed in this encounter.  No orders of the defined types were placed in this encounter.   Follow-Up Instructions: Return in about 6 months (around 01/26/2022) for Osteoarthritis.   Bo Merino, MD  Note - This record has been created using Editor, commissioning.  Chart creation errors have been sought, but may not always  have been located. Such creation errors do not reflect on  the standard of medical care.

## 2021-07-29 ENCOUNTER — Encounter: Payer: Self-pay | Admitting: Rheumatology

## 2021-07-29 ENCOUNTER — Ambulatory Visit: Payer: Medicare Other | Admitting: Rheumatology

## 2021-07-29 ENCOUNTER — Other Ambulatory Visit: Payer: Self-pay

## 2021-07-29 VITALS — BP 132/80 | HR 74 | Ht 62.0 in | Wt 172.2 lb

## 2021-07-29 DIAGNOSIS — M65331 Trigger finger, right middle finger: Secondary | ICD-10-CM

## 2021-07-29 DIAGNOSIS — M19041 Primary osteoarthritis, right hand: Secondary | ICD-10-CM | POA: Diagnosis not present

## 2021-07-29 DIAGNOSIS — M5030 Other cervical disc degeneration, unspecified cervical region: Secondary | ICD-10-CM | POA: Diagnosis not present

## 2021-07-29 DIAGNOSIS — Z8719 Personal history of other diseases of the digestive system: Secondary | ICD-10-CM

## 2021-07-29 DIAGNOSIS — Z8639 Personal history of other endocrine, nutritional and metabolic disease: Secondary | ICD-10-CM

## 2021-07-29 DIAGNOSIS — M170 Bilateral primary osteoarthritis of knee: Secondary | ICD-10-CM | POA: Diagnosis not present

## 2021-07-29 DIAGNOSIS — K5180 Other ulcerative colitis without complications: Secondary | ICD-10-CM

## 2021-07-29 DIAGNOSIS — Z87448 Personal history of other diseases of urinary system: Secondary | ICD-10-CM

## 2021-07-29 DIAGNOSIS — M19042 Primary osteoarthritis, left hand: Secondary | ICD-10-CM

## 2021-07-29 DIAGNOSIS — M7918 Myalgia, other site: Secondary | ICD-10-CM

## 2021-07-29 DIAGNOSIS — Z9049 Acquired absence of other specified parts of digestive tract: Secondary | ICD-10-CM

## 2021-07-29 DIAGNOSIS — M810 Age-related osteoporosis without current pathological fracture: Secondary | ICD-10-CM

## 2021-07-29 DIAGNOSIS — L900 Lichen sclerosus et atrophicus: Secondary | ICD-10-CM

## 2021-07-29 DIAGNOSIS — Z8659 Personal history of other mental and behavioral disorders: Secondary | ICD-10-CM

## 2021-07-29 DIAGNOSIS — Z8679 Personal history of other diseases of the circulatory system: Secondary | ICD-10-CM

## 2021-07-29 NOTE — Patient Instructions (Signed)
Hand Exercises Hand exercises can be helpful for almost anyone. These exercises can strengthen the hands, improve flexibility and movement, and increase blood flow to the hands. These results can make work and daily tasks easier. Hand exercises can be especially helpful for people who have joint pain from arthritis or have nerve damage from overuse (carpal tunnel syndrome). These exercises can also help people who have injured a hand. Exercises Most of these hand exercises are gentle stretching and motion exercises. It is usually safe to do them often throughout the day. Warming up your hands before exercise may help to reduce stiffness. You can do this with gentle massage or by placing your hands in warm water for 10-15 minutes. It is normal to feel some stretching, pulling, tightness, or mild discomfort as you begin new exercises. This will gradually improve. Stop an exercise right away if you feel sudden, severe pain or your pain gets worse. Ask your health care provider which exercises are best for you. Knuckle bend or "claw" fist  Stand or sit with your arm, hand, and all five fingers pointed straight up. Make sure to keep your wrist straight during the exercise. Gently bend your fingers down toward your palm until the tips of your fingers are touching the top of your palm. Keep your big knuckle straight and just bend the small knuckles in your fingers. Hold this position for __________ seconds. Straighten (extend) your fingers back to the starting position. Repeat this exercise 5-10 times with each hand. Full finger fist  Stand or sit with your arm, hand, and all five fingers pointed straight up. Make sure to keep your wrist straight during the exercise. Gently bend your fingers into your palm until the tips of your fingers are touching the middle of your palm. Hold this position for __________ seconds. Extend your fingers back to the starting position, stretching every joint fully. Repeat  this exercise 5-10 times with each hand. Straight fist Stand or sit with your arm, hand, and all five fingers pointed straight up. Make sure to keep your wrist straight during the exercise. Gently bend your fingers at the big knuckle, where your fingers meet your hand, and the middle knuckle. Keep the knuckle at the tips of your fingers straight and try to touch the bottom of your palm. Hold this position for __________ seconds. Extend your fingers back to the starting position, stretching every joint fully. Repeat this exercise 5-10 times with each hand. Tabletop  Stand or sit with your arm, hand, and all five fingers pointed straight up. Make sure to keep your wrist straight during the exercise. Gently bend your fingers at the big knuckle, where your fingers meet your hand, as far down as you can while keeping the small knuckles in your fingers straight. Think of forming a tabletop with your fingers. Hold this position for __________ seconds. Extend your fingers back to the starting position, stretching every joint fully. Repeat this exercise 5-10 times with each hand. Finger spread  Place your hand flat on a table with your palm facing down. Make sure your wrist stays straight as you do this exercise. Spread your fingers and thumb apart from each other as far as you can until you feel a gentle stretch. Hold this position for __________ seconds. Bring your fingers and thumb tight together again. Hold this position for __________ seconds. Repeat this exercise 5-10 times with each hand. Making circles  Stand or sit with your arm, hand, and all five fingers pointed   straight up. Make sure to keep your wrist straight during the exercise. Make a circle by touching the tip of your thumb to the tip of your index finger. Hold for __________ seconds. Then open your hand wide. Repeat this motion with your thumb and each finger on your hand. Repeat this exercise 5-10 times with each hand. Thumb  motion  Sit with your forearm resting on a table and your wrist straight. Your thumb should be facing up toward the ceiling. Keep your fingers relaxed as you move your thumb. Lift your thumb up as high as you can toward the ceiling. Hold for __________ seconds. Bend your thumb across your palm as far as you can, reaching the tip of your thumb for the small finger (pinkie) side of your palm. Hold for __________ seconds. Repeat this exercise 5-10 times with each hand. Grip strengthening  Hold a stress ball or other soft ball in the middle of your hand. Slowly increase the pressure, squeezing the ball as much as you can without causing pain. Think of bringing the tips of your fingers into the middle of your palm. All of your finger joints should bend when doing this exercise. Hold your squeeze for __________ seconds, then relax. Repeat this exercise 5-10 times with each hand. Contact a health care provider if: Your hand pain or discomfort gets much worse when you do an exercise. Your hand pain or discomfort does not improve within 2 hours after you exercise. If you have any of these problems, stop doing these exercises right away. Do not do them again unless your health care provider says that you can. Get help right away if: You develop sudden, severe hand pain or swelling. If this happens, stop doing these exercises right away. Do not do them again unless your health care provider says that you can. This information is not intended to replace advice given to you by your health care provider. Make sure you discuss any questions you have with your health care provider. Document Revised: 12/06/2020 Document Reviewed: 12/06/2020 Elsevier Patient Education  2022 [employer]  

## 2021-09-11 ENCOUNTER — Ambulatory Visit: Payer: Medicare Other | Admitting: Hematology & Oncology

## 2021-09-11 ENCOUNTER — Other Ambulatory Visit: Payer: Medicare Other

## 2021-09-20 ENCOUNTER — Telehealth: Payer: Self-pay

## 2021-09-20 NOTE — Telephone Encounter (Signed)
Patient advised we do not currently have anyone who can do ultrasounds in the office. Patient states she will call back at the end of next week to see what it looks like for the following week.

## 2021-09-20 NOTE — Telephone Encounter (Signed)
Patient left a voicemail stating she is experiencing pain in her hand and would like to be seen ASAP for right trigger finger injection.  Please advise.

## 2021-10-08 ENCOUNTER — Inpatient Hospital Stay: Payer: Medicare Other

## 2021-10-08 ENCOUNTER — Ambulatory Visit: Payer: Medicare Other | Admitting: Hematology & Oncology

## 2021-10-10 ENCOUNTER — Other Ambulatory Visit: Payer: Self-pay

## 2021-10-10 ENCOUNTER — Encounter: Payer: Self-pay | Admitting: Hematology & Oncology

## 2021-10-10 ENCOUNTER — Inpatient Hospital Stay: Payer: Medicare Other | Attending: Hematology & Oncology

## 2021-10-10 ENCOUNTER — Inpatient Hospital Stay: Payer: Medicare Other | Admitting: Hematology & Oncology

## 2021-10-10 ENCOUNTER — Encounter: Payer: Self-pay | Admitting: *Deleted

## 2021-10-10 VITALS — BP 111/65 | HR 84 | Temp 98.7°F | Resp 18 | Ht 62.0 in | Wt 172.0 lb

## 2021-10-10 DIAGNOSIS — D631 Anemia in chronic kidney disease: Secondary | ICD-10-CM

## 2021-10-10 DIAGNOSIS — C50912 Malignant neoplasm of unspecified site of left female breast: Secondary | ICD-10-CM | POA: Insufficient documentation

## 2021-10-10 DIAGNOSIS — N182 Chronic kidney disease, stage 2 (mild): Secondary | ICD-10-CM | POA: Diagnosis not present

## 2021-10-10 DIAGNOSIS — Z170 Estrogen receptor positive status [ER+]: Secondary | ICD-10-CM | POA: Insufficient documentation

## 2021-10-10 DIAGNOSIS — D500 Iron deficiency anemia secondary to blood loss (chronic): Secondary | ICD-10-CM

## 2021-10-10 DIAGNOSIS — Z79811 Long term (current) use of aromatase inhibitors: Secondary | ICD-10-CM | POA: Insufficient documentation

## 2021-10-10 LAB — CMP (CANCER CENTER ONLY)
ALT: 26 U/L (ref 0–44)
AST: 40 U/L (ref 15–41)
Albumin: 4.1 g/dL (ref 3.5–5.0)
Alkaline Phosphatase: 120 U/L (ref 38–126)
Anion gap: 10 (ref 5–15)
BUN: 17 mg/dL (ref 6–20)
CO2: 25 mmol/L (ref 22–32)
Calcium: 9.9 mg/dL (ref 8.9–10.3)
Chloride: 101 mmol/L (ref 98–111)
Creatinine: 0.87 mg/dL (ref 0.44–1.00)
GFR, Estimated: >60 mL/min (ref >60)
Glucose, Bld: 194 mg/dL — ABNORMAL HIGH (ref 70–99)
Potassium: 4.4 mmol/L (ref 3.5–5.1)
Sodium: 136 mmol/L (ref 135–145)
Total Bilirubin: 0.7 mg/dL (ref 0.3–1.2)
Total Protein: 8 g/dL (ref 6.5–8.1)

## 2021-10-10 LAB — CBC WITH DIFFERENTIAL (CANCER CENTER ONLY)
Abs Immature Granulocytes: 0.02 10*3/uL (ref 0.00–0.07)
Basophils Absolute: 0 10*3/uL (ref 0.0–0.1)
Basophils Relative: 1 %
Eosinophils Absolute: 0.4 10*3/uL (ref 0.0–0.5)
Eosinophils Relative: 7 %
HCT: 33.8 % — ABNORMAL LOW (ref 36.0–46.0)
Hemoglobin: 10.6 g/dL — ABNORMAL LOW (ref 12.0–15.0)
Immature Granulocytes: 0 %
Lymphocytes Relative: 17 %
Lymphs Abs: 0.9 10*3/uL (ref 0.7–4.0)
MCH: 27.4 pg (ref 26.0–34.0)
MCHC: 31.4 g/dL (ref 30.0–36.0)
MCV: 87.3 fL (ref 80.0–100.0)
Monocytes Absolute: 0.3 10*3/uL (ref 0.1–1.0)
Monocytes Relative: 6 %
Neutro Abs: 3.7 10*3/uL (ref 1.7–7.7)
Neutrophils Relative %: 69 %
Platelet Count: 200 10*3/uL (ref 150–400)
RBC: 3.87 MIL/uL (ref 3.87–5.11)
RDW: 13.5 % (ref 11.5–15.5)
WBC Count: 5.4 10*3/uL (ref 4.0–10.5)
nRBC: 0 % (ref 0.0–0.2)

## 2021-10-10 LAB — IRON AND IRON BINDING CAPACITY (CC-WL,HP ONLY)
Iron: 49 ug/dL (ref 28–170)
Saturation Ratios: 10 % — ABNORMAL LOW (ref 10.4–31.8)
TIBC: 476 ug/dL — ABNORMAL HIGH (ref 250–450)
UIBC: 427 ug/dL (ref 148–442)

## 2021-10-10 LAB — RETICULOCYTES
Immature Retic Fract: 15.2 % (ref 2.3–15.9)
RBC.: 3.84 MIL/uL — ABNORMAL LOW (ref 3.87–5.11)
Retic Count, Absolute: 76.4 10*3/uL (ref 19.0–186.0)
Retic Ct Pct: 2 % (ref 0.4–3.1)

## 2021-10-10 NOTE — Progress Notes (Signed)
Hematology and Oncology Follow Up Visit  Rhonda Gutierrez [id number] 1961/01/19 61 y.o. 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 daily   Interim History:  Rhonda Gutierrez is here today for follow-up.  We last saw her back in July.  At that time, her iron saturation was 12%.  We did give her some IV iron.  Unfortunately, COVID hit their household back in September.  Her husband is dealing with long COVID.  He cannot work.  He has severe fatigue.  He has memory issues.  She is feeling okay.  Her appetite is doing okay.  She is trying to watch her diabetes.  She has a insulin pump which has helped.  She has had no change in bowel or bladder habits.  She has had no problems with rashes.  There is been no leg swelling.  She has had no cough or shortness of breath..  She does do vitamin B12 at home.    Currently, I would say performance status is probably ECOG 0.  .    Medications:  Allergies as of 10/10/2021       Reactions   Aspartame Other (See Comments)   shaking   Clarithromycin Rash   Codeine Nausea And Vomiting   Mesalamine Other (See Comments)   "Sees things"   Rofecoxib Hives, Other (See Comments)   Hallucinations   Sulfasalazine Rash, Other (See Comments)   fever   Fish Oil Cough   Losartan Cough   Sulfa Antibiotics Rash, Other (See Comments)   fever   Sulfonamide Derivatives Rash, Other (See Comments)   fever        Medication List        Accurate as of October 10, 2021 12:58 PM. If you have any questions, ask your  nurse or doctor.          STOP taking these medications    Black Cohosh 540 MG Caps Stopped by: Volanda Napoleon, MD   cloNIDine 0.1 MG tablet Commonly known as: CATAPRES Stopped by: Volanda Napoleon, MD   Evening Primrose Oil 1000 MG Caps Stopped by: Volanda Napoleon, MD       TAKE these medications    alendronate 70 MG tablet Commonly known as: FOSAMAX Take 70 mg by mouth once a week. Take with a full glass of water on an empty stomach.   anastrozole 1 MG tablet Commonly known as: ARIMIDEX Take 1 tablet (1 mg total) by mouth every morning.   aspirin 81 MG tablet Take 81 mg by mouth daily.   atorvastatin 80 MG tablet Commonly known as: LIPITOR Take 80 mg by mouth daily.   Baqsimi Two Pack 3 MG/DOSE Powd Generic drug: Glucagon Place into the nose.   calcium carbonate 500 MG chewable tablet Commonly known as: TUMS - dosed in mg elemental calcium Chew 3 tablets by mouth daily.   calcium-vitamin D 500-200 MG-UNIT tablet Commonly known as: OSCAL WITH D Take 1 tablet by mouth 2 (two) times daily.   cetirizine 10 MG tablet Commonly known as: ZYRTEC  Take 10 mg by mouth daily as needed.   ciprofloxacin 500 MG tablet Commonly known as: CIPRO Take 500 mg by mouth as needed (For J pouch infection).   cyanocobalamin 1000 MCG/ML injection Commonly known as: (VITAMIN B-12) Inject into the muscle every 30 (thirty) days.   Dexcom G6 Receiver Devi by Does not apply route.   Dexcom G6 Sensor Misc by Does not apply route.   escitalopram 20 MG tablet Commonly known as: LEXAPRO Take 20 mg by mouth daily.   famotidine 20 MG tablet Commonly known as: PEPCID Take 20 mg by mouth 2 (two) times daily.   fenofibrate 160 MG tablet Take 160 mg by mouth daily.   fluticasone 50 MCG/ACT nasal spray Commonly known as: FLONASE Place into both nostrils daily.   folic acid 1 MG tablet Commonly known as: FOLVITE Take 1 mg by mouth daily.   gabapentin 600 MG  tablet Commonly known as: NEURONTIN Take 600 mg by mouth 3 (three) times daily.   insulin lispro 100 UNIT/ML KiwkPen Commonly known as: HumaLOG KwikPen Use as directed if pump does not work   HumaLOG 100 UNIT/ML injection Generic drug: insulin lispro USE IN INSULIN PUMP FOR A TOTAL OF 230 UNITS PER DAY   loperamide 2 MG capsule Commonly known as: IMODIUM Take 6 mg by mouth 2 (two) times daily.   LORazepam 1 MG tablet Commonly known as: ATIVAN Take 1 mg by mouth as needed for anxiety.   Magnesium Oxide 400 MG Caps   Melatonin 10 MG Caps Take 10 mg by mouth.   metroNIDAZOLE 500 MG tablet Commonly known as: FLAGYL Take 500 mg by mouth as needed.   multivitamin tablet Take 1 tablet by mouth daily.   nitroGLYCERIN 0.4 mg/hr patch Commonly known as: NITRODUR - Dosed in mg/24 hr Place 0.4 mg onto the skin as needed.   omeprazole 40 MG capsule Commonly known as: PRILOSEC Take 40 mg by mouth daily.   ondansetron 8 MG tablet Commonly known as: ZOFRAN Take 8 mg by mouth daily as needed.   pantoprazole 40 MG tablet Commonly known as: PROTONIX Take by mouth.   ramipril 2.5 MG capsule Commonly known as: ALTACE Take 2.5 mg by mouth 2 (two) times daily. Further refills through PCP.   simethicone 125 MG chewable tablet Commonly known as: MYLICON Chew 762 mg by mouth as needed for flatulence.   tretinoin 0.05 % cream Commonly known as: RETIN-A APPLY ONE APPLICATION EVERY NIGHT AT BEDTIME.   Vitamin D3 25 MCG (1000 UT) Caps Take 1,000 Units by mouth daily.        Allergies:  Allergies  Allergen Reactions   Aspartame Other (See Comments)    shaking   Clarithromycin Rash   Codeine Nausea And Vomiting   Mesalamine Other (See Comments)    "Sees things"   Rofecoxib Hives and Other (See Comments)    Hallucinations   Sulfasalazine Rash and Other (See Comments)    fever   Fish Oil Cough   Losartan Cough   Sulfa Antibiotics Rash and Other (See Comments)     fever    Sulfonamide Derivatives Rash and Other (See Comments)    fever    Past Medical History, Surgical history, Social history, and Family History were reviewed and updated.  Review of Systems: Review of Systems  Constitutional:  Positive for malaise/fatigue.  HENT: Negative.    Eyes: Negative.   Respiratory: Negative.    Cardiovascular: Negative.   Gastrointestinal: Negative.   Genitourinary: Negative.  Musculoskeletal: Negative.   Skin: Negative.   Neurological: Negative.   Endo/Heme/Allergies: Negative.   Psychiatric/Behavioral: Negative.      Physical Exam:  height is _0  (1.575 m) and weight is 172 lb (78 kg). Her oral temperature is 98.7 F (37.1 C). Her blood pressure is 111/65 and her pulse is 84. Her respiration is 18 and oxygen saturation is 100%.   Wt Readings from Last 3 Encounters:  10/10/21 172 lb (78 kg)  07/29/21 172 lb 3.2 oz (78.1 kg)  04/28/21 165 lb (74.8 kg)    Physical Exam Vitals reviewed.  Constitutional:      Comments: Her breast exam shows right breast no masses, edema or erythema.  There is no right axillary adenopathy.  Left breast shows the lumpectomy scar at about the 2 o'clock position.  This is well-healed.  There is some slight contraction of the left breast from radiation.  She has a well-healed lumpectomy scar.  There is no fullness in the left axilla.  HENT:     Head: Normocephalic and atraumatic.  Eyes:     Pupils: Pupils are equal, round, and reactive to light.  Cardiovascular:     Rate and Rhythm: Normal rate and regular rhythm.     Heart sounds: Normal heart sounds.  Pulmonary:     Effort: Pulmonary effort is normal.     Breath sounds: Normal breath sounds.  Abdominal:     General: Bowel sounds are normal.     Palpations: Abdomen is soft.  Musculoskeletal:        General: No tenderness or deformity. Normal range of motion.     Cervical back: Normal range of motion.  Lymphadenopathy:     Cervical: No cervical  adenopathy.  Skin:    General: Skin is warm and dry.     Findings: No erythema or rash.  Neurological:     Mental Status: She is alert and oriented to person, place, and time.  Psychiatric:        Behavior: Behavior normal.        Thought Content: Thought content normal.        Judgment: Judgment normal.     Lab Results  Component Value Date   WBC 5.4 10/10/2021   HGB 10.6 (L) 10/10/2021   HCT 33.8 (L) 10/10/2021   MCV 87.3 10/10/2021   PLT 200 10/10/2021   Lab Results  Component Value Date   FERRITIN 224 03/11/2021   IRON 45 03/11/2021   TIBC 383 03/11/2021   UIBC 337 03/11/2021   IRONPCTSAT 12 (L) 03/11/2021   Lab Results  Component Value Date   RETICCTPCT 2.0 10/10/2021   RBC 3.87 10/10/2021   RBC 3.84 (L) 10/10/2021   No results found for: KPAFRELGTCHN, LAMBDASER, KAPLAMBRATIO No results found for: IGGSERUM, IGA, IGMSERUM No results found for: Odetta Pink, SPEI   Chemistry      Component Value Date/Time   NA 136 10/10/2021 1159   K 4.4 10/10/2021 1159   CL 101 10/10/2021 1159   CO2 25 10/10/2021 1159   BUN 17 10/10/2021 1159   CREATININE 0.87 10/10/2021 1159      Component Value Date/Time   CALCIUM 9.9 10/10/2021 1159   CALCIUM 9.7 04/08/2011 1053   ALKPHOS 120 10/10/2021 1159   AST 40 10/10/2021 1159   ALT 26 10/10/2021 1159   BILITOT 0.7 10/10/2021 1159      Impression and Plan: Rhonda Gutierrez is a very pleasant 62  yo caucasian female with 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  Her recent problem is the early stage-stage I-infiltrating ductal carcinoma of the left breast.  This was found incidentally.  She underwent a lumpectomy and had radiation therapy.  All this was completed in April 2021.  She is on Arimidex.  She is doing well.  Her problem has been the iron deficiency.  We will have to see what her iron levels are.  Her hemoglobin is certainly a little bit  lower.  Again, the MCV is lower.  I have to suspect that the iron will be on the low side again.  I just feel bad that her poor husband is having a hard time with COVID.  I forgot to mention that she did have a mammogram done in November.  Everything looked fine.  It was a diagnostic mammogram.  From now on, she will need screening mammograms.  So it is not come up all that much.  I would like to have her come back in 6 months.  I think this would be reasonable.   Volanda Napoleon, MD 2/9/202312:58 PM

## 2021-10-11 LAB — FERRITIN: Ferritin: 130 ng/mL (ref 11–307)

## 2021-10-16 ENCOUNTER — Other Ambulatory Visit: Payer: Self-pay

## 2021-10-16 ENCOUNTER — Inpatient Hospital Stay: Payer: Medicare Other

## 2021-10-16 VITALS — BP 106/59 | HR 71 | Temp 98.8°F | Resp 18

## 2021-10-16 DIAGNOSIS — C50912 Malignant neoplasm of unspecified site of left female breast: Secondary | ICD-10-CM | POA: Diagnosis not present

## 2021-10-16 DIAGNOSIS — D508 Other iron deficiency anemias: Secondary | ICD-10-CM

## 2021-10-16 MED ORDER — SODIUM CHLORIDE 0.9 % IV SOLN: Freq: Once | INTRAVENOUS | Status: AC

## 2021-10-16 MED ORDER — SODIUM CHLORIDE 0.9 % IV SOLN
125.0000 mg | Freq: Once | INTRAVENOUS | Status: AC
  Administered 2021-10-16: 125 mg via INTRAVENOUS
  Filled 2021-10-16: qty 10

## 2021-10-16 NOTE — Patient Instructions (Signed)
Sodium Ferric Gluconate Complex Injection ?What is this medication? ?SODIUM FERRIC GLUCONATE COMPLEX (SOE dee um FER ik GLOO koe nate KOM pleks) treats low levels of iron (iron deficiency anemia) in people with kidney disease. Iron is a mineral that plays an important role in making red blood cells, which carry oxygen from your lungs to the rest of your body. ?This medicine may be used for other purposes; ask your health care provider or pharmacist if you have questions. ?COMMON BRAND NAME(S): Ferrlecit, Nulecit ?What should I tell my care team before I take this medication? ?They need to know if you have any of the following conditions: ?Anemia that is not from iron deficiency ?High levels of iron in the blood ?An unusual or allergic reaction to iron, other medications, foods, dyes, or preservatives ?Pregnant or are trying to become pregnant ?Breast-feeding ?How should I use this medication? ?This medication is injected into a vein. It is given by your care team in a hospital or clinic setting. ?Talk to your care team about the use of this medication in children. While it may be prescribed for children as young as 6 years for selected conditions, precautions do apply. ?Overdosage: If you think you have taken too much of this medicine contact a poison control center or emergency room at once. ?NOTE: This medicine is only for you. Do not share this medicine with others. ?What if I miss a dose? ?It is important not to miss your dose. Call your care team if you are unable to keep an appointment. ?What may interact with this medication? ?Do not take this medication with any of the following: ?Deferasirox ?Deferoxamine ?Dimercaprol ?This medication may also interact with the following: ?Other iron products ?This list may not describe all possible interactions. Give your health care provider a list of all the medicines, herbs, non-prescription drugs, or dietary supplements you use. Also tell them if you smoke, drink  alcohol, or use illegal drugs. Some items may interact with your medicine. ?What should I watch for while using this medication? ?Your condition will be monitored carefully while you are receiving this medication. ?Visit your care team for regular checks on your progress. You may need blood work while you are taking this medication. ?What side effects may I notice from receiving this medication? ?Side effects that you should report to your care team as soon as possible: ?Allergic reactions--skin rash, itching, hives, swelling of the face, lips, tongue, or throat ?Low blood pressure--dizziness, feeling faint or lightheaded, blurry vision ?Shortness of breath ?Side effects that usually do not require medical attention (report to your care team if they continue or are bothersome): ?Flushing ?Headache ?Joint pain ?Muscle pain ?Nausea ?Pain, redness, or irritation at injection site ?This list may not describe all possible side effects. Call your doctor for medical advice about side effects. You may report side effects to FDA at 1-800-FDA-1088. ?Where should I keep my medication? ?This medication is given in a hospital or clinic and will not be stored at home. ?NOTE: This sheet is a summary. It may not cover all possible information. If you have questions about this medicine, talk to your doctor, pharmacist, or health care provider. ?? 2022 Elsevier/Gold Standard (2021-01-11 00:00:00) ? ?

## 2021-12-19 ENCOUNTER — Encounter: Payer: Self-pay | Admitting: Hematology & Oncology

## 2021-12-20 ENCOUNTER — Telehealth: Payer: Self-pay | Admitting: *Deleted

## 2021-12-20 NOTE — Telephone Encounter (Signed)
Per staff message Mckenzie - called and lvm for call back to schedule (2) doses of IV Iron (Ferrlecit) ?

## 2021-12-20 NOTE — Telephone Encounter (Signed)
Per staff message Mckenzie (2) doses of IV Iron (Ferrlecit) - scheduled and confirmed ?

## 2021-12-24 ENCOUNTER — Inpatient Hospital Stay: Payer: Medicare Other | Attending: Hematology & Oncology

## 2021-12-24 VITALS — BP 123/65 | HR 91 | Temp 98.2°F | Resp 18

## 2021-12-24 DIAGNOSIS — Z79899 Other long term (current) drug therapy: Secondary | ICD-10-CM | POA: Diagnosis not present

## 2021-12-24 DIAGNOSIS — Z170 Estrogen receptor positive status [ER+]: Secondary | ICD-10-CM | POA: Diagnosis not present

## 2021-12-24 DIAGNOSIS — D508 Other iron deficiency anemias: Secondary | ICD-10-CM | POA: Diagnosis present

## 2021-12-24 DIAGNOSIS — C50912 Malignant neoplasm of unspecified site of left female breast: Secondary | ICD-10-CM | POA: Diagnosis present

## 2021-12-24 DIAGNOSIS — R5383 Other fatigue: Secondary | ICD-10-CM | POA: Insufficient documentation

## 2021-12-24 MED ORDER — SODIUM CHLORIDE 0.9 % IV SOLN: Freq: Once | INTRAVENOUS | Status: AC

## 2021-12-24 MED ORDER — SODIUM CHLORIDE 0.9 % IV SOLN
125.0000 mg | Freq: Once | INTRAVENOUS | Status: AC
  Administered 2021-12-24: 125 mg via INTRAVENOUS
  Filled 2021-12-24: qty 10

## 2021-12-24 NOTE — Patient Instructions (Signed)
Sodium Ferric Gluconate Complex Injection ?What is this medication? ?SODIUM FERRIC GLUCONATE COMPLEX (SOE dee um FER ik GLOO koe nate KOM pleks) treats low levels of iron (iron deficiency anemia) in people with kidney disease. Iron is a mineral that plays an important role in making red blood cells, which carry oxygen from your lungs to the rest of your body. ?This medicine may be used for other purposes; ask your health care provider or pharmacist if you have questions. ?COMMON BRAND NAME(S): Ferrlecit, Nulecit ?What should I tell my care team before I take this medication? ?They need to know if you have any of the following conditions: ?Anemia that is not from iron deficiency ?High levels of iron in the blood ?An unusual or allergic reaction to iron, other medications, foods, dyes, or preservatives ?Pregnant or are trying to become pregnant ?Breast-feeding ?How should I use this medication? ?This medication is injected into a vein. It is given by your care team in a hospital or clinic setting. ?Talk to your care team about the use of this medication in children. While it may be prescribed for children as young as 6 years for selected conditions, precautions do apply. ?Overdosage: If you think you have taken too much of this medicine contact a poison control center or emergency room at once. ?NOTE: This medicine is only for you. Do not share this medicine with others. ?What if I miss a dose? ?It is important not to miss your dose. Call your care team if you are unable to keep an appointment. ?What may interact with this medication? ?Do not take this medication with any of the following: ?Deferasirox ?Deferoxamine ?Dimercaprol ?This medication may also interact with the following: ?Other iron products ?This list may not describe all possible interactions. Give your health care provider a list of all the medicines, herbs, non-prescription drugs, or dietary supplements you use. Also tell them if you smoke, drink  alcohol, or use illegal drugs. Some items may interact with your medicine. ?What should I watch for while using this medication? ?Your condition will be monitored carefully while you are receiving this medication. ?Visit your care team for regular checks on your progress. You may need blood work while you are taking this medication. ?What side effects may I notice from receiving this medication? ?Side effects that you should report to your care team as soon as possible: ?Allergic reactions--skin rash, itching, hives, swelling of the face, lips, tongue, or throat ?Low blood pressure--dizziness, feeling faint or lightheaded, blurry vision ?Shortness of breath ?Side effects that usually do not require medical attention (report to your care team if they continue or are bothersome): ?Flushing ?Headache ?Joint pain ?Muscle pain ?Nausea ?Pain, redness, or irritation at injection site ?This list may not describe all possible side effects. Call your doctor for medical advice about side effects. You may report side effects to FDA at 1-800-FDA-1088. ?Where should I keep my medication? ?This medication is given in a hospital or clinic and will not be stored at home. ?NOTE: This sheet is a summary. It may not cover all possible information. If you have questions about this medicine, talk to your doctor, pharmacist, or health care provider. ?? 2023 Elsevier/Gold Standard (2021-01-11 00:00:00) ? ?

## 2021-12-29 ENCOUNTER — Other Ambulatory Visit: Payer: Self-pay | Admitting: Hematology & Oncology

## 2021-12-30 ENCOUNTER — Encounter: Payer: Self-pay | Admitting: Family

## 2021-12-31 ENCOUNTER — Inpatient Hospital Stay: Payer: Medicare Other | Attending: Hematology & Oncology

## 2021-12-31 VITALS — BP 129/69 | HR 60 | Temp 97.9°F | Resp 18

## 2021-12-31 DIAGNOSIS — D508 Other iron deficiency anemias: Secondary | ICD-10-CM | POA: Diagnosis present

## 2021-12-31 MED ORDER — SODIUM CHLORIDE 0.9 % IV SOLN
125.0000 mg | Freq: Once | INTRAVENOUS | Status: AC
  Administered 2021-12-31: 125 mg via INTRAVENOUS
  Filled 2021-12-31: qty 125

## 2021-12-31 MED ORDER — SODIUM CHLORIDE 0.9 % IV SOLN: Freq: Once | INTRAVENOUS | Status: AC

## 2021-12-31 NOTE — Patient Instructions (Signed)
[facility]  Discharge Instructions: ?Thank you for choosing [facility] to provide your oncology and hematology care.  ? ?If you have a lab appointment with the [facility], please go directly to the [facility] and check in at the registration area. ? ?Wear comfortable clothing and clothing appropriate for easy access to any Portacath or PICC line.  ? ?We strive to give you quality time with your provider. You may need to reschedule your appointment if you arrive late (15 or more minutes).  Arriving late affects you and other patients whose appointments are after yours.  Also, if you miss three or more appointments without notifying the office, you may be dismissed from the clinic at the provider?s discretion.    ?  ?For prescription refill requests, have your pharmacy contact our office and allow 72 hours for refills to be completed.   ? ?Today you received the following chemotherapy and/or immunotherapy agents Ferrlecit. ?  ?To help prevent nausea and vomiting after your treatment, we encourage you to take your nausea medication as directed. ? ?BELOW ARE SYMPTOMS THAT SHOULD BE REPORTED IMMEDIATELY: ?*FEVER GREATER THAN 100.4 F (38 ?C) OR HIGHER ?*CHILLS OR SWEATING ?*NAUSEA AND VOMITING THAT IS NOT CONTROLLED WITH YOUR NAUSEA MEDICATION ?*UNUSUAL SHORTNESS OF BREATH ?*UNUSUAL BRUISING OR BLEEDING ?*URINARY PROBLEMS (pain or burning when urinating, or frequent urination) ?*BOWEL PROBLEMS (unusual diarrhea, constipation, pain near the anus) ?TENDERNESS IN MOUTH AND THROAT WITH OR WITHOUT PRESENCE OF ULCERS (sore throat, sores in mouth, or a toothache) ?UNUSUAL RASH, SWELLING OR PAIN  ?UNUSUAL VAGINAL DISCHARGE OR ITCHING  ? ?Items with * indicate a potential emergency and should be followed up as soon as possible or go to the Emergency Department if any problems should occur. ? ?Please show the CHEMOTHERAPY ALERT CARD or IMMUNOTHERAPY ALERT CARD at check-in to the  Emergency Department and triage nurse. ?Should you have questions after your visit or need to cancel or reschedule your appointment, please contact [facility]  [phone] and follow the prompts.  Office hours are 8:00 a.m. to 4:30 p.m. Monday - Friday. Please note that voicemails left after 4:00 p.m. may not be returned until the following business day.  We are closed weekends and major holidays. You have access to a nurse at all times for urgent questions. Please call the main number to the clinic [phone] and follow the prompts. ? ?For any non-urgent questions, you may also contact your provider using MyChart. We now offer e-Visits for anyone 63 and older to request care online for non-urgent symptoms. For details visit [email or web address]. ?  ?Also download the MyChart app! Go to the app store, search "MyChart", open the app, select [facility], and log in with your MyChart username and password. ? ?Due to Covid, a mask is required upon entering the hospital/clinic. If you do not have a mask, one will be given to you upon arrival. For doctor visits, patients may have 1 support person aged 97 or older with them. For treatment visits, patients cannot have anyone with them due to current Covid guidelines and our immunocompromised population.  ?

## 2022-01-24 NOTE — Progress Notes (Signed)
Office Visit Note  Patient: Rhonda Gutierrez             Date of Birth: November 01, 1960           MRN: [id number]             PCP: Oren Section, NP-C Referring: Oren Section, NP-C Visit Date: 02/05/2022 Occupation: _0 @  Subjective:  Left little finger pain   History of Present Illness: Rhonda Gutierrez is a 61 y.o. female with history of osteoarthritis, degenerative disc disease.  She continues to have pain and discomfort in her bilateral hands.  She has been having pain in her bilateral little finger DIP joints in her index finger DIP joint on the left hand.  She had bilateral middle trigger finger injections in February 2023 by the orthopedic surgeon.  She had good response to the cortisone injections.  She continues to have discomfort in her neck and her bilateral knee joints.  None of the other joints are painful currently.  Activities of Daily Living:  Patient reports morning stiffness for 30 minutes.   Patient Denies nocturnal pain.  Difficulty dressing/grooming: Denies Difficulty climbing stairs: Denies Difficulty getting out of chair: Denies Difficulty using hands for taps, buttons, cutlery, and/or writing: Reports  Review of Systems  Constitutional:  Positive for fatigue.  HENT:  Negative for mouth dryness.   Eyes:  Positive for dryness.  Respiratory:  Negative for shortness of breath.   Cardiovascular:  Negative for swelling in legs/feet.  Gastrointestinal:  Positive for diarrhea.  Endocrine: Positive for heat intolerance.  Genitourinary:  Negative for difficulty urinating.  Musculoskeletal:  Positive for joint swelling and morning stiffness.  Skin:  Negative for rash.  Allergic/Immunologic: Negative for susceptible to infections.  Neurological:  Positive for weakness.  Hematological:  Negative for bruising/bleeding tendency.  Psychiatric/Behavioral:  Negative for sleep disturbance.    PMFS History:  Patient Active Problem List   Diagnosis Date Noted    Lichen sclerosus et atrophicus 05/17/2020   Age-related osteoporosis without current pathological fracture 05/17/2020   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   IDA (iron deficiency anemia) 11/05/2017   Primary osteoarthritis of both hands 07/10/2017   Primary osteoarthritis of both knees 07/10/2017   Plantar fasciitis 07/10/2017   DDD (degenerative disc disease), cervical 07/10/2017   Vitamin D deficiency 07/10/2017   Abnormal laboratory test 07/10/2017   Gastroenteritis presumed infectious 08/12/2015   Nausea vomiting and diarrhea 07/30/2015   ARF (acute renal failure) ([place]) 07/30/2015   Diarrhea 07/30/2015   Acute kidney injury ([place]) 07/29/2015   Pain in joint, ankle and foot 05/03/2013   Type I (juvenile type) diabetes mellitus with renal manifestations, not stated as uncontrolled(250.41) 01/11/2013   Carpal tunnel syndrome, bilateral 10/07/2011   Cough 07/09/2010   PROTEINURIA, MILD 04/10/2009   DISTURBANCE OF SKIN SENSATION 01/02/2009   HYPERLIPIDEMIA 11/21/2008   RENAL INSUFFICIENCY 11/21/2008   Ulcerative colitis ([place]) 03/06/2008   FATTY LIVER DISEASE 03/06/2008   Essential hypertension 04/14/2007    Past Medical History:  Diagnosis Date   Anemia    Breast cancer ([place])    Chronically dry eyes    Dyslipidemia    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   Fatty liver disease, nonalcoholic    Hives    Sleep apnea    Type I (juvenile type) diabetes mellitus without mention of complication, not stated as uncontrolled    Ulcerative colitis, unspecified    Unspecified essential hypertension     Family History  Problem Relation  Age of Onset   Cancer Father    Allergy (severe) Daughter    Past Surgical History:  Procedure Laterality Date   APPENDECTOMY     BREAST LUMPECTOMY WITH SENTINEL LYMPH NODE BIOPSY Left 09/27/2019   CARPAL TUNNEL RELEASE Right 09/2011   CARPAL TUNNEL RELEASE Left 07/2016   CERVICAL DISCECTOMY  07/02/2018   c6,c7   CHOLECYSTECTOMY   1993   CYST EXCISION  12/2011   benign cyst removed from right forearm   ILEOSTOMY  06/17/2006   take down   Insulin pump     mass removal      right arm, benign   NASAL SINUS SURGERY  02/10/1994   PROTOCOLECTOMY  12/08/2005   total   stoma  2007   reversal of ileostomy   [facility]   Social History   Social History Narrative   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 She believes that when she returns to work with much more activity, her glucoses will come down some more.   Immunization History  Administered Date(s) Administered   Influenza Inj Mdck Quad Pf 05/28/2017   Influenza Split 07/08/2011   Influenza Whole 07/02/2008, 06/01/2013   Influenza,inj,Quad PF,6+ Mos 05/22/2019   Influenza-Unspecified 07/08/2011, 05/28/2017, 04/25/2020   PFIZER(Purple Top)SARS-COV-2 Vaccination 11/11/2019, 12/02/2019   Pneumococcal Polysaccharide-23 06/01/2006, 06/01/2013     Objective: Vital Signs: BP 123/71 (BP Location: Right Arm, Patient Position: Sitting, Cuff Size: Normal)   Pulse 90   Resp 16   Ht _0  (1.575 m)   Wt 174 lb (78.9 kg)   BMI 31.83 kg/m    Physical Exam Vitals and nursing note reviewed.  Constitutional:      Appearance: She is well-developed.  HENT:     Head: Normocephalic and atraumatic.  Eyes:     Conjunctiva/sclera: Conjunctivae normal.  Cardiovascular:     Rate and Rhythm: Normal rate and regular rhythm.     Heart sounds: Normal heart sounds.  Pulmonary:     Effort: Pulmonary effort is normal.     Breath sounds: Normal breath sounds.  Abdominal:     General: Bowel sounds are normal.     Palpations: Abdomen is soft.  Musculoskeletal:     Cervical back: Normal range of motion.  Lymphadenopathy:     Cervical: No cervical adenopathy.  Skin:    General: Skin is warm and dry.     Capillary Refill: Capillary refill takes less than 2 seconds.  Neurological:     Mental Status: She  is alert and oriented to person, place, and time.  Psychiatric:        Behavior: Behavior normal.     Musculoskeletal Exam: C-spine was in good range of motion.  Shoulder joints, elbow joints, wrist joints with good range of motion.  She had bilateral DIP thickening with no synovitis.  Hip joints and knee joints in good range of motion.  No warmth swelling or effusion was noted.  There was no tenderness over ankles or MTPs.  CDAI Exam: CDAI Score: -- Patient Global: --; Provider Global: -- Swollen: --; Tender: -- Joint Exam 02/05/2022   No joint exam has been documented for this visit   There is currently no information documented on the homunculus. Go to the Rheumatology activity and complete the homunculus joint exam.  Investigation: No additional findings.  Imaging: No results found.  Recent Labs: Lab Results  Component Value Date  WBC 5.4 10/10/2021   HGB 10.6 (L) 10/10/2021   PLT 200 10/10/2021   NA 136 10/10/2021   K 4.4 10/10/2021   CL 101 10/10/2021   CO2 25 10/10/2021   GLUCOSE 194 (H) 10/10/2021   BUN 17 10/10/2021   CREATININE 0.87 10/10/2021   BILITOT 0.7 10/10/2021   ALKPHOS 120 10/10/2021   AST 40 10/10/2021   ALT 26 10/10/2021   PROT 8.0 10/10/2021   ALBUMIN 4.1 10/10/2021   CALCIUM 9.9 10/10/2021   GFRAA >60 05/15/2020    Speciality Comments: No specialty comments available.  Procedures:  No procedures performed Allergies: Aspartame, Clarithromycin, Codeine, Mesalamine, Rofecoxib, Sulfasalazine, Fish oil, Losartan, Sulfa antibiotics, and Sulfonamide derivatives   Assessment / Plan:     Visit Diagnoses: Primary osteoarthritis of both hands-she has severe osteoarthritis in her hands with bilateral DIP thickening.  She has been having increased pain and discomfort in her right little finger DIP joint.  Joint protection muscle strengthening was discussed.  A handout on hand exercises was given.  Patient is moving to [place].  She will establish doctors  locally.  Trigger finger, right middle finger - injected on 09/05/20.  Patient had bilateral middle trigger finger injections in February 2023 by orthopedic surgeon.  She states the symptoms resolved.  Primary osteoarthritis of both knees-she continues to have pain and discomfort in her knee joints off and on.  No warmth swelling or effusion was noted.  A handout on lower extremity muscle strength exercises was given.  DDD (degenerative disc disease), cervical - s/p discectomy.  She had good range of motion.  She has intermittent discomfort.  Myofascial pain-she continues to have some generalized pain and discomfort.  Age-related osteoporosis without current pathological fracture - Fosamax, Ca, Vitamin D  History of vitamin D deficiency-she takes vitamin D supplement.  Other medical problems listed as follows:  Other ulcerative colitis without complication (HCC)  History of colectomy  Iron deficiency anemia secondary to malabsorption-followed by Dr. Marin Olp  History of breast cancer stage I (T1b N0 M0)-left breast, ER positive, PR positive, HER2 negative Oncotype score 13 s/p lobectomy September 27, 2019-followed by Dr. Marin Olp  History of fatty infiltration of liver  History of hyperlipidemia  History of hypertension  History of diabetes mellitus  History of chronic kidney disease/ Dr Brayton El manages   History of gastroesophageal reflux (GERD)  History of anxiety  Orders: No orders of the defined types were placed in this encounter.  No orders of the defined types were placed in this encounter.    Follow-Up Instructions: Return if symptoms worsen or fail to improve, for Osteoarthritis.  Patient is moving to [place].  She will return on as needed basis.   Bo Merino, MD  Note - This record has been created using Editor, commissioning.  Chart creation errors have been sought, but may not always  have been located. Such creation errors do not reflect on  the standard of  medical care.

## 2022-01-27 ENCOUNTER — Other Ambulatory Visit: Payer: Self-pay | Admitting: Hematology & Oncology

## 2022-01-27 ENCOUNTER — Ambulatory Visit: Payer: Medicare Other | Admitting: Rheumatology

## 2022-01-28 ENCOUNTER — Encounter: Payer: Self-pay | Admitting: Family

## 2022-02-05 ENCOUNTER — Encounter: Payer: Self-pay | Admitting: Rheumatology

## 2022-02-05 ENCOUNTER — Ambulatory Visit: Payer: Medicare Other | Admitting: Rheumatology

## 2022-02-05 VITALS — BP 123/71 | HR 90 | Resp 16 | Ht 62.0 in | Wt 174.0 lb

## 2022-02-05 DIAGNOSIS — Z8719 Personal history of other diseases of the digestive system: Secondary | ICD-10-CM

## 2022-02-05 DIAGNOSIS — M65331 Trigger finger, right middle finger: Secondary | ICD-10-CM | POA: Diagnosis not present

## 2022-02-05 DIAGNOSIS — M19041 Primary osteoarthritis, right hand: Secondary | ICD-10-CM

## 2022-02-05 DIAGNOSIS — Z8659 Personal history of other mental and behavioral disorders: Secondary | ICD-10-CM

## 2022-02-05 DIAGNOSIS — M5030 Other cervical disc degeneration, unspecified cervical region: Secondary | ICD-10-CM

## 2022-02-05 DIAGNOSIS — Z8639 Personal history of other endocrine, nutritional and metabolic disease: Secondary | ICD-10-CM

## 2022-02-05 DIAGNOSIS — D508 Other iron deficiency anemias: Secondary | ICD-10-CM

## 2022-02-05 DIAGNOSIS — M7918 Myalgia, other site: Secondary | ICD-10-CM

## 2022-02-05 DIAGNOSIS — Z87448 Personal history of other diseases of urinary system: Secondary | ICD-10-CM

## 2022-02-05 DIAGNOSIS — M170 Bilateral primary osteoarthritis of knee: Secondary | ICD-10-CM | POA: Diagnosis not present

## 2022-02-05 DIAGNOSIS — M810 Age-related osteoporosis without current pathological fracture: Secondary | ICD-10-CM

## 2022-02-05 DIAGNOSIS — K5180 Other ulcerative colitis without complications: Secondary | ICD-10-CM

## 2022-02-05 DIAGNOSIS — M19042 Primary osteoarthritis, left hand: Secondary | ICD-10-CM

## 2022-02-05 DIAGNOSIS — Z9049 Acquired absence of other specified parts of digestive tract: Secondary | ICD-10-CM

## 2022-02-05 DIAGNOSIS — Z853 Personal history of malignant neoplasm of breast: Secondary | ICD-10-CM

## 2022-02-05 DIAGNOSIS — Z8679 Personal history of other diseases of the circulatory system: Secondary | ICD-10-CM

## 2022-02-05 NOTE — Patient Instructions (Signed)
Hand Exercises Hand exercises can be helpful for almost anyone. These exercises can strengthen the hands, improve flexibility and movement, and increase blood flow to the hands. These results can make work and daily tasks easier. Hand exercises can be especially helpful for people who have joint pain from arthritis or have nerve damage from overuse (carpal tunnel syndrome). These exercises can also help people who have injured a hand. Exercises Most of these hand exercises are gentle stretching and motion exercises. It is usually safe to do them often throughout the day. Warming up your hands before exercise may help to reduce stiffness. You can do this with gentle massage or by placing your hands in warm water for 10-15 minutes. It is normal to feel some stretching, pulling, tightness, or mild discomfort as you begin new exercises. This will gradually improve. Stop an exercise right away if you feel sudden, severe pain or your pain gets worse. Ask your health care provider which exercises are best for you. Knuckle bend or "claw" fist  Stand or sit with your arm, hand, and all five fingers pointed straight up. Make sure to keep your wrist straight during the exercise. Gently bend your fingers down toward your palm until the tips of your fingers are touching the top of your palm. Keep your big knuckle straight and just bend the small knuckles in your fingers. Hold this position for __________ seconds. Straighten (extend) your fingers back to the starting position. Repeat this exercise 5-10 times with each hand. Full finger fist  Stand or sit with your arm, hand, and all five fingers pointed straight up. Make sure to keep your wrist straight during the exercise. Gently bend your fingers into your palm until the tips of your fingers are touching the middle of your palm. Hold this position for __________ seconds. Extend your fingers back to the starting position, stretching every joint fully. Repeat  this exercise 5-10 times with each hand. Straight fist Stand or sit with your arm, hand, and all five fingers pointed straight up. Make sure to keep your wrist straight during the exercise. Gently bend your fingers at the big knuckle, where your fingers meet your hand, and the middle knuckle. Keep the knuckle at the tips of your fingers straight and try to touch the bottom of your palm. Hold this position for __________ seconds. Extend your fingers back to the starting position, stretching every joint fully. Repeat this exercise 5-10 times with each hand. Tabletop  Stand or sit with your arm, hand, and all five fingers pointed straight up. Make sure to keep your wrist straight during the exercise. Gently bend your fingers at the big knuckle, where your fingers meet your hand, as far down as you can while keeping the small knuckles in your fingers straight. Think of forming a tabletop with your fingers. Hold this position for __________ seconds. Extend your fingers back to the starting position, stretching every joint fully. Repeat this exercise 5-10 times with each hand. Finger spread  Place your hand flat on a table with your palm facing down. Make sure your wrist stays straight as you do this exercise. Spread your fingers and thumb apart from each other as far as you can until you feel a gentle stretch. Hold this position for __________ seconds. Bring your fingers and thumb tight together again. Hold this position for __________ seconds. Repeat this exercise 5-10 times with each hand. Making circles  Stand or sit with your arm, hand, and all five fingers pointed   straight up. Make sure to keep your wrist straight during the exercise. Make a circle by touching the tip of your thumb to the tip of your index finger. Hold for __________ seconds. Then open your hand wide. Repeat this motion with your thumb and each finger on your hand. Repeat this exercise 5-10 times with each hand. Thumb  motion  Sit with your forearm resting on a table and your wrist straight. Your thumb should be facing up toward the ceiling. Keep your fingers relaxed as you move your thumb. Lift your thumb up as high as you can toward the ceiling. Hold for __________ seconds. Bend your thumb across your palm as far as you can, reaching the tip of your thumb for the small finger (pinkie) side of your palm. Hold for __________ seconds. Repeat this exercise 5-10 times with each hand. Grip strengthening  Hold a stress ball or other soft ball in the middle of your hand. Slowly increase the pressure, squeezing the ball as much as you can without causing pain. Think of bringing the tips of your fingers into the middle of your palm. All of your finger joints should bend when doing this exercise. Hold your squeeze for __________ seconds, then relax. Repeat this exercise 5-10 times with each hand. Contact a health care provider if: Your hand pain or discomfort gets much worse when you do an exercise. Your hand pain or discomfort does not improve within 2 hours after you exercise. If you have any of these problems, stop doing these exercises right away. Do not do them again unless your health care provider says that you can. Get help right away if: You develop sudden, severe hand pain or swelling. If this happens, stop doing these exercises right away. Do not do them again unless your health care provider says that you can. This information is not intended to replace advice given to you by your health care provider. Make sure you discuss any questions you have with your health care provider. Document Revised: 12/06/2020 Document Reviewed: 12/06/2020 Elsevier Patient Education  2023 [employer] Knee Exercises Ask your health care provider which exercises are safe for you. Do exercises exactly as told by your health care provider and adjust them as directed. It is normal to feel mild stretching, pulling, tightness, or  discomfort as you do these exercises. Stop right away if you feel sudden pain or your pain gets worse. Do not begin these exercises until told by your health care provider. Stretching and range-of-motion exercises These exercises warm up your muscles and joints and improve the movement and flexibility of your knee. These exercises also help to relieve pain and swelling. Knee extension, prone  Lie on your abdomen (prone position) on a bed. Place your left / right knee just beyond the edge of the surface so your knee is not on the bed. You can put a towel under your left / right thigh just above your kneecap for comfort. Relax your leg muscles and allow gravity to straighten your knee (extension). You should feel a stretch behind your left / right knee. Hold this position for __________ seconds. Scoot up so your knee is supported between repetitions. Repeat __________ times. Complete this exercise __________ times a day. Knee flexion, active  Lie on your back with both legs straight. If this causes back discomfort, bend your left / right knee so your foot is flat on the floor. Slowly slide your left / right heel back toward your buttocks. Stop when   you feel a gentle stretch in the front of your knee or thigh (flexion). Hold this position for __________ seconds. Slowly slide your left / right heel back to the starting position. Repeat __________ times. Complete this exercise __________ times a day. Quadriceps stretch, prone  Lie on your abdomen on a firm surface, such as a bed or padded floor. Bend your left / right knee and hold your ankle. If you cannot reach your ankle or pant leg, loop a belt around your foot and grab the belt instead. Gently pull your heel toward your buttocks. Your knee should not slide out to the side. You should feel a stretch in the front of your thigh and knee (quadriceps). Hold this position for __________ seconds. Repeat __________ times. Complete this exercise  __________ times a day. Hamstring, supine  Lie on your back (supine position). Loop a belt or towel over the ball of your left / right foot. The ball of your foot is on the walking surface, right under your toes. Straighten your left / right knee and slowly pull on the belt to raise your leg until you feel a gentle stretch behind your knee (hamstring). Do not let your knee bend while you do this. Keep your other leg flat on the floor. Hold this position for __________ seconds. Repeat __________ times. Complete this exercise __________ times a day. Strengthening exercises These exercises build strength and endurance in your knee. Endurance is the ability to use your muscles for a long time, even after they get tired. Quadriceps, isometric This exercise strengthens the muscles in front of your thigh (quadriceps) without moving your knee joint (isometric). Lie on your back with your left / right leg extended and your other knee bent. Put a rolled towel or small pillow under your knee if told by your health care provider. Slowly tense the muscles in the front of your left / right thigh. You should see your kneecap slide up toward your hip or see increased dimpling just above the knee. This motion will push the back of the knee toward the floor. For __________ seconds, hold the muscle as tight as you can without increasing your pain. Relax the muscles slowly and completely. Repeat __________ times. Complete this exercise __________ times a day. Straight leg raises This exercise strengthens the muscles in front of your thigh (quadriceps) and the muscles that move your hips (hip flexors). Lie on your back with your left / right leg extended and your other knee bent. Tense the muscles in the front of your left / right thigh. You should see your kneecap slide up or see increased dimpling just above the knee. Your thigh may even shake a bit. Keep these muscles tight as you raise your leg 4-6 inches  (10-15 cm) off the floor. Do not let your knee bend. Hold this position for __________ seconds. Keep these muscles tense as you lower your leg. Relax your muscles slowly and completely after each repetition. Repeat __________ times. Complete this exercise __________ times a day. Hamstring, isometric  Lie on your back on a firm surface. Bend your left / right knee about __________ degrees. Dig your left / right heel into the surface as if you are trying to pull it toward your buttocks. Tighten the muscles in the back of your thighs (hamstring) to "dig" as hard as you can without increasing any pain. Hold this position for __________ seconds. Release the tension gradually and allow your muscles to relax completely for __________ seconds   after each repetition. Repeat __________ times. Complete this exercise __________ times a day. Hamstring curls If told by your health care provider, do this exercise while wearing ankle weights. Begin with __________lb / kg weights. Then increase the weight by 1 lb (0.5 kg) increments. Do not wear ankle weights that are more than __________lb / kg. Lie on your abdomen with your legs straight. Bend your left / right knee as far as you can without feeling pain. Keep your hips flat against the floor. Hold this position for __________ seconds. Slowly lower your leg to the starting position. Repeat __________ times. Complete this exercise __________ times a day. Squats This exercise strengthens the muscles in front of your thigh and knee (quadriceps). Stand in front of a table, with your feet and knees pointing straight ahead. You may rest your hands on the table for balance but not for support. Slowly bend your knees and lower your hips like you are going to sit in a chair. Keep your weight over your heels, not over your toes. Keep your lower legs upright so they are parallel with the table legs. Do not let your hips go lower than your knees. Do not bend lower  than told by your health care provider. If your knee pain increases, do not bend as low. Hold the squat position for __________ seconds. Slowly push with your legs to return to standing. Do not use your hands to pull yourself to standing. Repeat __________ times. Complete this exercise __________ times a day. Wall slides This exercise strengthens the muscles in front of your thigh and knee (quadriceps). Lean your back against a smooth wall or door, and walk your feet out 18-24 inches (46-61 cm) from it. Place your feet hip-width apart. Slowly slide down the wall or door until your knees bend __________ degrees. Keep your knees over your heels, not over your toes. Keep your knees in line with your hips. Hold this position for __________ seconds. Repeat __________ times. Complete this exercise __________ times a day. Straight leg raises, side-lying This exercise strengthens the muscles that rotate the leg at the hip and move it away from your body (hip abductors). Lie on your side with your left / right leg in the top position. Lie so your head, shoulder, knee, and hip line up. You may bend your bottom knee to help you keep your balance. Roll your hips slightly forward so your hips are stacked directly over each other and your left / right knee is facing forward. Leading with your heel, lift your top leg 4-6 inches (10-15 cm). You should feel the muscles in your outer hip lifting. Do not let your foot drift forward. Do not let your knee roll toward the ceiling. Hold this position for __________ seconds. Slowly return your leg to the starting position. Let your muscles relax completely after each repetition. Repeat __________ times. Complete this exercise __________ times a day. Straight leg raises, prone This exercise stretches the muscles that move your hips away from the front of the pelvis (hip extensors). Lie on your abdomen on a firm surface. You can put a pillow under your hips if that  is more comfortable. Tense the muscles in your buttocks and lift your left / right leg about 4-6 inches (10-15 cm). Keep your knee straight as you lift your leg. Hold this position for __________ seconds. Slowly lower your leg to the starting position. Let your leg relax completely after each repetition. Repeat __________ times. Complete this exercise   __________ times a day. This information is not intended to replace advice given to you by your health care provider. Make sure you discuss any questions you have with your health care provider. Document Revised: 04/30/2021 Document Reviewed: 04/30/2021 Elsevier Patient Education  2023 [employer]  

## 2022-02-25 ENCOUNTER — Other Ambulatory Visit: Payer: Self-pay | Admitting: Hematology & Oncology

## 2022-03-24 ENCOUNTER — Other Ambulatory Visit: Payer: Self-pay | Admitting: Hematology & Oncology

## 2022-04-09 ENCOUNTER — Inpatient Hospital Stay: Payer: Medicare Other | Attending: Hematology & Oncology | Admitting: Hematology & Oncology

## 2022-04-09 ENCOUNTER — Inpatient Hospital Stay: Payer: Medicare Other

## 2022-04-09 ENCOUNTER — Other Ambulatory Visit: Payer: Self-pay

## 2022-04-09 ENCOUNTER — Encounter: Payer: Self-pay | Admitting: Hematology & Oncology

## 2022-04-09 VITALS — BP 125/69 | HR 76 | Temp 98.5°F | Resp 18 | Wt 175.0 lb

## 2022-04-09 DIAGNOSIS — D631 Anemia in chronic kidney disease: Secondary | ICD-10-CM | POA: Diagnosis not present

## 2022-04-09 DIAGNOSIS — D500 Iron deficiency anemia secondary to blood loss (chronic): Secondary | ICD-10-CM | POA: Diagnosis not present

## 2022-04-09 DIAGNOSIS — D508 Other iron deficiency anemias: Secondary | ICD-10-CM | POA: Insufficient documentation

## 2022-04-09 LAB — CBC WITH DIFFERENTIAL (CANCER CENTER ONLY)
Abs Immature Granulocytes: 0.03 10*3/uL (ref 0.00–0.07)
Basophils Absolute: 0 10*3/uL (ref 0.0–0.1)
Basophils Relative: 1 %
Eosinophils Absolute: 0.5 10*3/uL (ref 0.0–0.5)
Eosinophils Relative: 11 %
HCT: 33.9 % — ABNORMAL LOW (ref 36.0–46.0)
Hemoglobin: 10.5 g/dL — ABNORMAL LOW (ref 12.0–15.0)
Immature Granulocytes: 1 %
Lymphocytes Relative: 20 %
Lymphs Abs: 1 10*3/uL (ref 0.7–4.0)
MCH: 27 pg (ref 26.0–34.0)
MCHC: 31 g/dL (ref 30.0–36.0)
MCV: 87.1 fL (ref 80.0–100.0)
Monocytes Absolute: 0.3 10*3/uL (ref 0.1–1.0)
Monocytes Relative: 6 %
Neutro Abs: 3.2 10*3/uL (ref 1.7–7.7)
Neutrophils Relative %: 61 %
Platelet Count: 199 10*3/uL (ref 150–400)
RBC: 3.89 MIL/uL (ref 3.87–5.11)
RDW: 13.7 % (ref 11.5–15.5)
WBC Count: 5.1 10*3/uL (ref 4.0–10.5)
nRBC: 0 % (ref 0.0–0.2)

## 2022-04-09 LAB — CMP (CANCER CENTER ONLY)
ALT: 21 U/L (ref 0–44)
AST: 28 U/L (ref 15–41)
Albumin: 4.3 g/dL (ref 3.5–5.0)
Alkaline Phosphatase: 105 U/L (ref 38–126)
Anion gap: 7 (ref 5–15)
BUN: 21 mg/dL — ABNORMAL HIGH (ref 6–20)
CO2: 27 mmol/L (ref 22–32)
Calcium: 10.7 mg/dL — ABNORMAL HIGH (ref 8.9–10.3)
Chloride: 103 mmol/L (ref 98–111)
Creatinine: 0.87 mg/dL (ref 0.44–1.00)
GFR, Estimated: >60 mL/min (ref >60)
Glucose, Bld: 206 mg/dL — ABNORMAL HIGH (ref 70–99)
Potassium: 4.2 mmol/L (ref 3.5–5.1)
Sodium: 137 mmol/L (ref 135–145)
Total Bilirubin: 0.6 mg/dL (ref 0.3–1.2)
Total Protein: 7.4 g/dL (ref 6.5–8.1)

## 2022-04-09 LAB — LACTATE DEHYDROGENASE: LDH: 164 U/L (ref 98–192)

## 2022-04-09 LAB — FERRITIN: Ferritin: 50 ng/mL (ref 11–307)

## 2022-04-09 NOTE — Progress Notes (Signed)
She had to leave because her husband needed to have preadmission testing done for knee surgery.  We will have to reschedule her.

## 2022-04-10 ENCOUNTER — Encounter: Payer: Self-pay | Admitting: *Deleted

## 2022-04-10 LAB — IRON AND IRON BINDING CAPACITY (CC-WL,HP ONLY)
Iron: 47 ug/dL (ref 28–170)
Saturation Ratios: 10 % — ABNORMAL LOW (ref 10.4–31.8)
TIBC: 475 ug/dL — ABNORMAL HIGH (ref 250–450)
UIBC: 428 ug/dL (ref 148–442)

## 2022-04-11 ENCOUNTER — Telehealth: Payer: Self-pay | Admitting: *Deleted

## 2022-04-11 NOTE — Telephone Encounter (Signed)
Called yesterday and today to schedule 2 doses of IV Iron -left message for a call back to schedule

## 2022-04-17 ENCOUNTER — Inpatient Hospital Stay: Payer: Medicare Other

## 2022-04-17 VITALS — BP 116/72 | HR 72 | Temp 98.5°F | Resp 18

## 2022-04-17 DIAGNOSIS — D508 Other iron deficiency anemias: Secondary | ICD-10-CM | POA: Diagnosis not present

## 2022-04-17 MED ORDER — SODIUM CHLORIDE 0.9 % IV SOLN: Freq: Once | INTRAVENOUS | Status: AC

## 2022-04-17 MED ORDER — SODIUM CHLORIDE 0.9 % IV SOLN
125.0000 mg | Freq: Once | INTRAVENOUS | Status: AC
  Administered 2022-04-17: 125 mg via INTRAVENOUS
  Filled 2022-04-17: qty 10

## 2022-04-17 NOTE — Patient Instructions (Signed)
Sodium Ferric Gluconate Complex Injection What is this medication? SODIUM FERRIC GLUCONATE COMPLEX (SOE dee um FER ik GLOO koe nate KOM pleks) treats low levels of iron (iron deficiency anemia) in people with kidney disease. Iron is a mineral that plays an important role in making red blood cells, which carry oxygen from your lungs to the rest of your body. This medicine may be used for other purposes; ask your health care provider or pharmacist if you have questions. COMMON BRAND NAME(S): Ferrlecit, Nulecit What should I tell my care team before I take this medication? They need to know if you have any of the following conditions: Anemia that is not from iron deficiency High levels of iron in the blood An unusual or allergic reaction to iron, other medications, foods, dyes, or preservatives Pregnant or are trying to become pregnant Breast-feeding How should I use this medication? This medication is injected into a vein. It is given by your care team in a hospital or clinic setting. Talk to your care team about the use of this medication in children. While it may be prescribed for children as young as 6 years for selected conditions, precautions do apply. Overdosage: If you think you have taken too much of this medicine contact a poison control center or emergency room at once. NOTE: This medicine is only for you. Do not share this medicine with others. What if I miss a dose? It is important not to miss your dose. Call your care team if you are unable to keep an appointment. What may interact with this medication? Do not take this medication with any of the following: Deferasirox Deferoxamine Dimercaprol This medication may also interact with the following: Other iron products This list may not describe all possible interactions. Give your health care provider a list of all the medicines, herbs, non-prescription drugs, or dietary supplements you use. Also tell them if you smoke, drink  alcohol, or use illegal drugs. Some items may interact with your medicine. What should I watch for while using this medication? Your condition will be monitored carefully while you are receiving this medication. Visit your care team for regular checks on your progress. You may need blood work while you are taking this medication. What side effects may I notice from receiving this medication? Side effects that you should report to your care team as soon as possible: Allergic reactions--skin rash, itching, hives, swelling of the face, lips, tongue, or throat Low blood pressure--dizziness, feeling faint or lightheaded, blurry vision Shortness of breath Side effects that usually do not require medical attention (report to your care team if they continue or are bothersome): Flushing Headache Joint pain Muscle pain Nausea Pain, redness, or irritation at injection site This list may not describe all possible side effects. Call your doctor for medical advice about side effects. You may report side effects to FDA at 1-800-FDA-1088. Where should I keep my medication? This medication is given in a hospital or clinic and will not be stored at home. NOTE: This sheet is a summary. It may not cover all possible information. If you have questions about this medicine, talk to your doctor, pharmacist, or health care provider.  2023 Elsevier/Gold Standard (2021-01-11 00:00:00)

## 2022-04-24 ENCOUNTER — Inpatient Hospital Stay: Payer: Medicare Other

## 2022-04-24 VITALS — BP 104/81 | HR 71 | Temp 98.7°F | Resp 17

## 2022-04-24 DIAGNOSIS — D508 Other iron deficiency anemias: Secondary | ICD-10-CM | POA: Diagnosis not present

## 2022-04-24 MED ORDER — SODIUM CHLORIDE 0.9 % IV SOLN
125.0000 mg | Freq: Once | INTRAVENOUS | Status: AC
  Administered 2022-04-24: 125 mg via INTRAVENOUS
  Filled 2022-04-24: qty 10

## 2022-04-24 MED ORDER — SODIUM CHLORIDE 0.9 % IV SOLN: Freq: Once | INTRAVENOUS | Status: AC

## 2022-05-02 ENCOUNTER — Other Ambulatory Visit: Payer: Self-pay | Admitting: *Deleted

## 2022-05-02 ENCOUNTER — Inpatient Hospital Stay: Payer: Medicare Other | Attending: Hematology & Oncology | Admitting: Hematology & Oncology

## 2022-05-02 ENCOUNTER — Encounter: Payer: Self-pay | Admitting: Hematology & Oncology

## 2022-05-02 DIAGNOSIS — E538 Deficiency of other specified B group vitamins: Secondary | ICD-10-CM | POA: Insufficient documentation

## 2022-05-02 DIAGNOSIS — D631 Anemia in chronic kidney disease: Secondary | ICD-10-CM | POA: Insufficient documentation

## 2022-05-02 DIAGNOSIS — C50912 Malignant neoplasm of unspecified site of left female breast: Secondary | ICD-10-CM | POA: Insufficient documentation

## 2022-05-02 DIAGNOSIS — Z79811 Long term (current) use of aromatase inhibitors: Secondary | ICD-10-CM | POA: Insufficient documentation

## 2022-05-02 DIAGNOSIS — Z170 Estrogen receptor positive status [ER+]: Secondary | ICD-10-CM | POA: Insufficient documentation

## 2022-05-02 DIAGNOSIS — K5100 Ulcerative (chronic) pancolitis without complications: Secondary | ICD-10-CM | POA: Diagnosis not present

## 2022-05-02 DIAGNOSIS — N182 Chronic kidney disease, stage 2 (mild): Secondary | ICD-10-CM | POA: Diagnosis not present

## 2022-05-02 MED ORDER — ANASTROZOLE 1 MG PO TABS: 1.0000 mg | ORAL_TABLET | Freq: Every morning | ORAL | 0 refills | Status: DC

## 2022-05-02 MED ORDER — FLUCONAZOLE 100 MG PO TABS: 100.0000 mg | ORAL_TABLET | Freq: Every day | ORAL | 0 refills | Status: DC

## 2022-05-02 NOTE — Progress Notes (Signed)
Hematology and Oncology Follow Up Visit  Rhonda Gutierrez [id number] [birth year] 61 y.o. 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 daily   Interim History:  Ms. Rhonda Gutierrez is here today for follow-up.  She is doing okay.  She is under quite a bit of stress.  Her husband had knee surgery several weeks ago.  She been trying to help him out.  He apparently had some issues after having the surgery.  Thankfully, he is doing okay right now.  He is doing some physical therapy which is little bit painful.  He is affected by long COVID.  He does not have a lot of energy.  Her blood sugars have been on the high side because of the stress she is under.  Her iron studies back in early August showed a ferritin of 50 with an iron saturation of 10%.  We did go ahead and give her some Ferrlecit.  She has had no problems with nausea or vomiting.  She does have a insulin pump that she uses.  She has had no leg swelling.  There is been no tingling in the hands or feet.  She has had no bleeding.  There is been no obvious change in bowel or bladder habits.  She has a rash under the left breast.  She has seen dermatology for this.  A biopsy was done which suggest that this was a "hives."  To me, this looks like hives.  I will going try her on some Diflucan to see if this may help.  Currently, I would have said that her performance status is ECOG 1.     Medications:  Allergies as of 05/02/2022       Reactions   Aspartame Other (See Comments)   shaking    Clarithromycin Rash   Codeine Nausea And Vomiting   Mesalamine Other (See Comments)   "Sees things"   Rofecoxib Hives, Other (See Comments)   Hallucinations   Sulfasalazine Rash, Other (See Comments)   fever   Fish Oil Cough   Losartan Cough   Lyumjev [insulin Lispro] Rash   Sulfa Antibiotics Rash, Other (See Comments)   fever   Sulfonamide Derivatives Rash, Other (See Comments)   fever        Medication List        Accurate as of May 02, 2022 12:10 PM. If you have any questions, ask your nurse or doctor.          alendronate 70 MG tablet Commonly known as: FOSAMAX Take 70 mg by mouth once a week. Take with a full glass of water on an empty stomach.   ALPRAZolam 0.25 MG tablet Commonly known as: XANAX Take 0.25 mg by mouth 3 (three) times daily as needed.   anastrozole 1 MG tablet Commonly known as: ARIMIDEX TAKE 1 TABLET BY MOUTH IN THE MORNING   aspirin 81 MG tablet Take 81 mg by mouth daily.   atorvastatin 80 MG tablet Commonly known as: LIPITOR Take 80 mg by mouth daily.   Baqsimi Two Pack 3 MG/DOSE  Powd Generic drug: Glucagon Place into the nose.   calcium carbonate 500 MG chewable tablet Commonly known as: TUMS - dosed in mg elemental calcium Chew 3 tablets by mouth daily.   calcium-vitamin D 500-200 MG-UNIT tablet Commonly known as: OSCAL WITH D Take 1 tablet by mouth 2 (two) times daily.   cetirizine 10 MG tablet Commonly known as: ZYRTEC Take 10 mg by mouth daily as needed.   ciprofloxacin 500 MG tablet Commonly known as: CIPRO Take 500 mg by mouth as needed (For J pouch infection).   cyanocobalamin 1000 MCG/ML injection Commonly known as: VITAMIN B12 Inject into the muscle every 30 (thirty) days.   Dexcom G6 Receiver Devi by Does not apply route.   Dexcom G6 Sensor Misc by Does not apply route.   escitalopram 20 MG tablet Commonly known as: LEXAPRO Take 20 mg by mouth daily.   famotidine 20 MG tablet Commonly known as:  PEPCID Take 20 mg by mouth 2 (two) times daily.   fenofibrate 160 MG tablet Take 160 mg by mouth daily.   fluticasone 50 MCG/ACT nasal spray Commonly known as: FLONASE Place into both nostrils daily.   folic acid 1 MG tablet Commonly known as: FOLVITE Take 1 mg by mouth daily.   gabapentin 600 MG tablet Commonly known as: NEURONTIN Take 600 mg by mouth 3 (three) times daily.   Hyoscyamine Sulfate SL 0.125 MG Subl Place 0.125 mg under the tongue every 6 (six) hours as needed.   insulin lispro 100 UNIT/ML KiwkPen Commonly known as: HumaLOG KwikPen Use as directed if pump does not work   HumaLOG 100 UNIT/ML injection Generic drug: insulin lispro USE IN INSULIN PUMP FOR A TOTAL OF 230 UNITS PER DAY   loperamide 2 MG capsule Commonly known as: IMODIUM Take 6 mg by mouth 2 (two) times daily.   Magnesium Oxide 400 MG Caps   metroNIDAZOLE 500 MG tablet Commonly known as: FLAGYL Take 500 mg by mouth as needed.   multivitamin tablet Take 1 tablet by mouth daily.   nitroGLYCERIN 0.4 mg/hr patch Commonly known as: NITRODUR - Dosed in mg/24 hr Place 0.4 mg onto the skin as needed.   omeprazole 40 MG capsule Commonly known as: PRILOSEC Take 40 mg by mouth daily.   ondansetron 8 MG tablet Commonly known as: ZOFRAN Take 8 mg by mouth daily as needed.   pantoprazole 40 MG tablet Commonly known as: PROTONIX Take by mouth.   pantoprazole 40 MG tablet Commonly known as: PROTONIX Take 1 tablet by mouth 2 (two) times daily.   ramipril 2.5 MG capsule Commonly known as: ALTACE Take 2.5 mg by mouth 2 (two) times daily. Further refills through PCP.   simethicone 125 MG chewable tablet Commonly known as: MYLICON Chew 270 mg by mouth as needed for flatulence.   tretinoin 0.05 % cream Commonly known as: RETIN-A APPLY ONE APPLICATION EVERY NIGHT AT BEDTIME.   Vitamin D3 25 MCG (1000 UT) Caps Take 1,000 Units by mouth daily.        Allergies:  Allergies  Allergen  Reactions   Aspartame Other (See Comments)    shaking   Clarithromycin Rash   Codeine Nausea And Vomiting   Mesalamine Other (See Comments)    "Sees things"   Rofecoxib Hives and Other (See Comments)    Hallucinations   Sulfasalazine Rash and Other (See Comments)    fever   Fish Oil Cough   Losartan Cough   Lyumjev [Insulin Lispro] Rash   Sulfa  Antibiotics Rash and Other (See Comments)    fever    Sulfonamide Derivatives Rash and Other (See Comments)    fever    Past Medical History, Surgical history, Social history, and Family History were reviewed and updated.  Review of Systems: Review of Systems  Constitutional:  Positive for malaise/fatigue.  HENT: Negative.    Eyes: Negative.   Respiratory: Negative.    Cardiovascular: Negative.   Gastrointestinal: Negative.   Genitourinary: Negative.   Musculoskeletal: Negative.   Skin: Negative.   Neurological: Negative.   Endo/Heme/Allergies: Negative.   Psychiatric/Behavioral: Negative.       Physical Exam:  vitals were not taken for this visit.   Wt Readings from Last 3 Encounters:  04/09/22 175 lb (79.4 kg)  02/05/22 174 lb (78.9 kg)  10/10/21 172 lb (78 kg)    Physical Exam Vitals reviewed.  Constitutional:      Comments: Her breast exam shows right breast no masses, edema or erythema.  There is no right axillary adenopathy.  Left breast shows the lumpectomy scar at about the 2 o'clock position.  This is well-healed.  There is some slight contraction of the left breast from radiation.  She does have some erythema under the left breast.  She has a well-healed lumpectomy scar.  There is no fullness in the left axilla.  HENT:     Head: Normocephalic and atraumatic.  Eyes:     Pupils: Pupils are equal, round, and reactive to light.  Cardiovascular:     Rate and Rhythm: Normal rate and regular rhythm.     Heart sounds: Normal heart sounds.  Pulmonary:     Effort: Pulmonary effort is normal.     Breath sounds:  Normal breath sounds.  Abdominal:     General: Bowel sounds are normal.     Palpations: Abdomen is soft.  Musculoskeletal:        General: No tenderness or deformity. Normal range of motion.     Cervical back: Normal range of motion.  Lymphadenopathy:     Cervical: No cervical adenopathy.  Skin:    General: Skin is warm and dry.     Findings: No erythema or rash.  Neurological:     Mental Status: She is alert and oriented to person, place, and time.  Psychiatric:        Behavior: Behavior normal.        Thought Content: Thought content normal.        Judgment: Judgment normal.      Lab Results  Component Value Date   WBC 5.1 04/09/2022   HGB 10.5 (L) 04/09/2022   HCT 33.9 (L) 04/09/2022   MCV 87.1 04/09/2022   PLT 199 04/09/2022   Lab Results  Component Value Date   FERRITIN 50 04/09/2022   IRON 47 04/09/2022   TIBC 475 (H) 04/09/2022   UIBC 428 04/09/2022   IRONPCTSAT 10 (L) 04/09/2022   Lab Results  Component Value Date   RETICCTPCT 2.0 10/10/2021   RBC 3.89 04/09/2022   No results found for: "KPAFRELGTCHN", "LAMBDASER", "KAPLAMBRATIO" No results found for: "IGGSERUM", "IGA", "IGMSERUM" No results found for: "TOTALPROTELP", "ALBUMINELP", "A1GS", "A2GS", "BETS", "BETA2SER", "GAMS", "MSPIKE", "SPEI"   Chemistry      Component Value Date/Time   NA 137 04/09/2022 1132   K 4.2 04/09/2022 1132   CL 103 04/09/2022 1132   CO2 27 04/09/2022 1132   BUN 21 (H) 04/09/2022 1132   CREATININE 0.87 04/09/2022 1132  Component Value Date/Time   CALCIUM 10.7 (H) 04/09/2022 1132   CALCIUM 9.7 04/08/2011 1053   ALKPHOS 105 04/09/2022 1132   AST 28 04/09/2022 1132   ALT 21 04/09/2022 1132   BILITOT 0.6 04/09/2022 1132      Impression and Plan: Ms. 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  Her recent problem is the early stage-stage I-infiltrating ductal carcinoma of the left  breast.  This was found incidentally.  She underwent a lumpectomy and had radiation therapy.  All this was completed in April 2021.  She is on Arimidex.  Hopefully, the Diflucan will help with this rash.  We will have her come back in November so we can check her lab work to see how she is doing with her iron studies.  I would like to get her back before the Holiday season to make sure that her blood is as good as possible.  I will see her back myself in 6 months.  This typically is a good interval for [place]. Volanda Napoleon, MD 9/1/202312:10 PM

## 2022-05-19 ENCOUNTER — Encounter: Payer: Self-pay | Admitting: Hematology & Oncology

## 2022-05-21 ENCOUNTER — Other Ambulatory Visit: Payer: Self-pay

## 2022-05-21 DIAGNOSIS — R21 Rash and other nonspecific skin eruption: Secondary | ICD-10-CM

## 2022-05-21 MED ORDER — FLUCONAZOLE 100 MG PO TABS: 100.0000 mg | ORAL_TABLET | Freq: Every day | ORAL | 0 refills | Status: DC

## 2022-05-21 MED ORDER — NYSTATIN 100000 UNIT/GM EX POWD: 1.0000 | Freq: Three times a day (TID) | CUTANEOUS | 0 refills | Status: DC

## 2022-06-27 HISTORY — PX: CYST EXCISION: SHX5701

## 2022-07-01 ENCOUNTER — Other Ambulatory Visit: Payer: Self-pay

## 2022-07-01 DIAGNOSIS — D631 Anemia in chronic kidney disease: Secondary | ICD-10-CM

## 2022-07-02 ENCOUNTER — Inpatient Hospital Stay: Payer: Medicare Other | Attending: Hematology & Oncology

## 2022-07-02 DIAGNOSIS — D509 Iron deficiency anemia, unspecified: Secondary | ICD-10-CM | POA: Insufficient documentation

## 2022-07-02 DIAGNOSIS — C50912 Malignant neoplasm of unspecified site of left female breast: Secondary | ICD-10-CM | POA: Diagnosis not present

## 2022-07-02 DIAGNOSIS — D631 Anemia in chronic kidney disease: Secondary | ICD-10-CM

## 2022-07-02 LAB — CMP (CANCER CENTER ONLY)
ALT: 21 U/L (ref 0–44)
AST: 29 U/L (ref 15–41)
Albumin: 4.1 g/dL (ref 3.5–5.0)
Alkaline Phosphatase: 144 U/L — ABNORMAL HIGH (ref 38–126)
Anion gap: 8 (ref 5–15)
BUN: 16 mg/dL (ref 6–20)
CO2: 26 mmol/L (ref 22–32)
Calcium: 10.3 mg/dL (ref 8.9–10.3)
Chloride: 101 mmol/L (ref 98–111)
Creatinine: 0.87 mg/dL (ref 0.44–1.00)
GFR, Estimated: >60 mL/min (ref >60)
Glucose, Bld: 308 mg/dL — ABNORMAL HIGH (ref 70–99)
Potassium: 4.5 mmol/L (ref 3.5–5.1)
Sodium: 135 mmol/L (ref 135–145)
Total Bilirubin: 0.7 mg/dL (ref 0.3–1.2)
Total Protein: 7.8 g/dL (ref 6.5–8.1)

## 2022-07-02 LAB — CBC WITH DIFFERENTIAL (CANCER CENTER ONLY)
Abs Immature Granulocytes: 0.03 10*3/uL (ref 0.00–0.07)
Basophils Absolute: 0 10*3/uL (ref 0.0–0.1)
Basophils Relative: 1 %
Eosinophils Absolute: 0.5 10*3/uL (ref 0.0–0.5)
Eosinophils Relative: 8 %
HCT: 30.7 % — ABNORMAL LOW (ref 36.0–46.0)
Hemoglobin: 9.5 g/dL — ABNORMAL LOW (ref 12.0–15.0)
Immature Granulocytes: 1 %
Lymphocytes Relative: 19 %
Lymphs Abs: 1 10*3/uL (ref 0.7–4.0)
MCH: 26.9 pg (ref 26.0–34.0)
MCHC: 30.9 g/dL (ref 30.0–36.0)
MCV: 87 fL (ref 80.0–100.0)
Monocytes Absolute: 0.3 10*3/uL (ref 0.1–1.0)
Monocytes Relative: 6 %
Neutro Abs: 3.6 10*3/uL (ref 1.7–7.7)
Neutrophils Relative %: 65 %
Platelet Count: 188 10*3/uL (ref 150–400)
RBC: 3.53 MIL/uL — ABNORMAL LOW (ref 3.87–5.11)
RDW: 14.2 % (ref 11.5–15.5)
WBC Count: 5.5 10*3/uL (ref 4.0–10.5)
nRBC: 0 % (ref 0.0–0.2)

## 2022-07-02 LAB — FERRITIN: Ferritin: 51 ng/mL (ref 11–307)

## 2022-07-03 ENCOUNTER — Encounter: Payer: Self-pay | Admitting: *Deleted

## 2022-07-03 LAB — IRON AND IRON BINDING CAPACITY (CC-WL,HP ONLY)
Iron: 40 ug/dL (ref 28–170)
Saturation Ratios: 9 % — ABNORMAL LOW (ref 10.4–31.8)
TIBC: 463 ug/dL — ABNORMAL HIGH (ref 250–450)
UIBC: 423 ug/dL (ref 148–442)

## 2022-07-11 ENCOUNTER — Inpatient Hospital Stay: Payer: Medicare Other

## 2022-07-11 VITALS — BP 124/68 | HR 66 | Temp 98.2°F | Resp 17

## 2022-07-11 DIAGNOSIS — D508 Other iron deficiency anemias: Secondary | ICD-10-CM

## 2022-07-11 DIAGNOSIS — C50912 Malignant neoplasm of unspecified site of left female breast: Secondary | ICD-10-CM | POA: Diagnosis not present

## 2022-07-11 MED ORDER — SODIUM CHLORIDE 0.9 % IV SOLN: Freq: Once | INTRAVENOUS | Status: AC

## 2022-07-11 MED ORDER — SODIUM CHLORIDE 0.9 % IV SOLN
125.0000 mg | Freq: Once | INTRAVENOUS | Status: AC
  Administered 2022-07-11: 125 mg via INTRAVENOUS
  Filled 2022-07-11: qty 125

## 2022-07-11 NOTE — Patient Instructions (Addendum)
Rhonda Gutierrez AT [facility]  Discharge Instructions: Thank you for choosing [facility] to provide your oncology and hematology care.   If you have a lab appointment with the [facility], please go directly to the [facility] and check in at the registration area.  Wear comfortable clothing and clothing appropriate for easy access to any Portacath or PICC line.   We strive to give you quality time with your provider. You may need to reschedule your appointment if you arrive late (15 or more minutes).  Arriving late affects you and other patients whose appointments are after yours.  Also, if you miss three or more appointments without notifying the office, you may be dismissed from the clinic at the provider's discretion.      For prescription refill requests, have your pharmacy contact our office and allow 72 hours for refills to be completed.    Today you received the following chemotherapy and/or immunotherapy agents Ferrlecit.   To help prevent nausea and vomiting after your treatment, we encourage you to take your nausea medication as directed.  BELOW ARE SYMPTOMS THAT SHOULD BE REPORTED IMMEDIATELY: *FEVER GREATER THAN 100.4 F (38 C) OR HIGHER *CHILLS OR SWEATING *NAUSEA AND VOMITING THAT IS NOT CONTROLLED WITH YOUR NAUSEA MEDICATION *UNUSUAL SHORTNESS OF BREATH *UNUSUAL BRUISING OR BLEEDING *URINARY PROBLEMS (pain or burning when urinating, or frequent urination) *BOWEL PROBLEMS (unusual diarrhea, constipation, pain near the anus) TENDERNESS IN MOUTH AND THROAT WITH OR WITHOUT PRESENCE OF ULCERS (sore throat, sores in mouth, or a toothache) UNUSUAL RASH, SWELLING OR PAIN  UNUSUAL VAGINAL DISCHARGE OR ITCHING   Items with * indicate a potential emergency and should be followed up as soon as possible or go to the Emergency Department if any problems should occur.  Please show the CHEMOTHERAPY ALERT CARD or IMMUNOTHERAPY ALERT CARD at check-in to the  Emergency Department and triage nurse. Should you have questions after your visit or need to cancel or reschedule your appointment, please contact [facility]  [phone] and follow the prompts.  Office hours are 8:00 a.m. to 4:30 p.m. Monday - Friday. Please note that voicemails left after 4:00 p.m. may not be returned until the following business day.  We are closed weekends and major holidays. You have access to a nurse at all times for urgent questions. Please call the main number to the clinic [phone] and follow the prompts.  For any non-urgent questions, you may also contact your provider using MyChart. We now offer e-Visits for anyone 69 and older to request care online for non-urgent symptoms. For details visit [email or web address].   Also download the MyChart app! Go to the app store, search "MyChart", open the app, select [facility], and log in with your MyChart username and password.  Due to Covid, a mask is required upon entering the hospital/clinic. If you do not have a mask, one will be given to you upon arrival. For doctor visits, patients may have 1 support person aged 12 or older with them. For treatment visits, patients cannot have anyone with them due to current Covid guidelines and our immunocompromised population. Sodium Ferric Gluconate Complex Injection What is this medication? SODIUM FERRIC GLUCONATE COMPLEX (SOE dee um FER ik GLOO koe nate KOM pleks) treats low levels of iron (iron deficiency anemia) in people with kidney disease. Iron is a mineral that plays an important role in making red blood cells, which carry oxygen from your lungs to the  rest of your body. This medicine may be used for other purposes; ask your health care provider or pharmacist if you have questions. COMMON BRAND NAME(S): Ferrlecit, Nulecit What should I tell my care team before I take this medication? They need to know if you have any of the following  conditions: Anemia that is not from iron deficiency High levels of iron in the blood An unusual or allergic reaction to iron, other medications, foods, dyes, or preservatives Pregnant or are trying to become pregnant Breast-feeding How should I use this medication? This medication is injected into a vein. It is given by your care team in a hospital or clinic setting. Talk to your care team about the use of this medication in children. While it may be prescribed for children as young as 6 years for selected conditions, precautions do apply. Overdosage: If you think you have taken too much of this medicine contact a poison control center or emergency room at once. NOTE: This medicine is only for you. Do not share this medicine with others. What if I miss a dose? It is important not to miss your dose. Call your care team if you are unable to keep an appointment. What may interact with this medication? Do not take this medication with any of the following: Deferasirox Deferoxamine Dimercaprol This medication may also interact with the following: Other iron products This list may not describe all possible interactions. Give your health care provider a list of all the medicines, herbs, non-prescription drugs, or dietary supplements you use. Also tell them if you smoke, drink alcohol, or use illegal drugs. Some items may interact with your medicine. What should I watch for while using this medication? Your condition will be monitored carefully while you are receiving this medication. Visit your care team for regular checks on your progress. You may need blood work while you are taking this medication. What side effects may I notice from receiving this medication? Side effects that you should report to your care team as soon as possible: Allergic reactions--skin rash, itching, hives, swelling of the face, lips, tongue, or throat Low blood pressure--dizziness, feeling faint or lightheaded, blurry  vision Shortness of breath Side effects that usually do not require medical attention (report to your care team if they continue or are bothersome): Flushing Headache Joint pain Muscle pain Nausea Pain, redness, or irritation at injection site This list may not describe all possible side effects. Call your doctor for medical advice about side effects. You may report side effects to FDA at 1-800-FDA-1088. Where should I keep my medication? This medication is given in a hospital or clinic and will not be stored at home. NOTE: This sheet is a summary. It may not cover all possible information. If you have questions about this medicine, talk to your doctor, pharmacist, or health care provider.  2023 Elsevier/Gold Standard (2007-10-09 00:00:00)

## 2022-07-18 ENCOUNTER — Inpatient Hospital Stay: Payer: Medicare Other

## 2022-07-18 VITALS — BP 112/65 | HR 80 | Temp 97.6°F | Resp 16

## 2022-07-18 DIAGNOSIS — D508 Other iron deficiency anemias: Secondary | ICD-10-CM

## 2022-07-18 DIAGNOSIS — C50912 Malignant neoplasm of unspecified site of left female breast: Secondary | ICD-10-CM | POA: Diagnosis not present

## 2022-07-18 MED ORDER — SODIUM CHLORIDE 0.9 % IV SOLN: Freq: Once | INTRAVENOUS | Status: AC

## 2022-07-18 MED ORDER — SODIUM CHLORIDE 0.9 % IV SOLN
125.0000 mg | Freq: Once | INTRAVENOUS | Status: AC
  Administered 2022-07-18: 125 mg via INTRAVENOUS
  Filled 2022-07-18: qty 125

## 2022-07-18 NOTE — Patient Instructions (Signed)
Sodium Ferric Gluconate Complex Injection What is this medication? SODIUM FERRIC GLUCONATE COMPLEX (SOE dee um FER ik GLOO koe nate KOM pleks) treats low levels of iron (iron deficiency anemia) in people with kidney disease. Iron is a mineral that plays an important role in making red blood cells, which carry oxygen from your lungs to the rest of your body. This medicine may be used for other purposes; ask your health care provider or pharmacist if you have questions. COMMON BRAND NAME(S): Ferrlecit, Nulecit What should I tell my care team before I take this medication? They need to know if you have any of the following conditions: Anemia that is not from iron deficiency High levels of iron in the blood An unusual or allergic reaction to iron, other medications, foods, dyes, or preservatives Pregnant or are trying to become pregnant Breast-feeding How should I use this medication? This medication is injected into a vein. It is given by your care team in a hospital or clinic setting. Talk to your care team about the use of this medication in children. While it may be prescribed for children as young as 6 years for selected conditions, precautions do apply. Overdosage: If you think you have taken too much of this medicine contact a poison control center or emergency room at once. NOTE: This medicine is only for you. Do not share this medicine with others. What if I miss a dose? It is important not to miss your dose. Call your care team if you are unable to keep an appointment. What may interact with this medication? Do not take this medication with any of the following: Deferasirox Deferoxamine Dimercaprol This medication may also interact with the following: Other iron products This list may not describe all possible interactions. Give your health care provider a list of all the medicines, herbs, non-prescription drugs, or dietary supplements you use. Also tell them if you smoke, drink  alcohol, or use illegal drugs. Some items may interact with your medicine. What should I watch for while using this medication? Your condition will be monitored carefully while you are receiving this medication. Visit your care team for regular checks on your progress. You may need blood work while you are taking this medication. What side effects may I notice from receiving this medication? Side effects that you should report to your care team as soon as possible: Allergic reactions--skin rash, itching, hives, swelling of the face, lips, tongue, or throat Low blood pressure--dizziness, feeling faint or lightheaded, blurry vision Shortness of breath Side effects that usually do not require medical attention (report to your care team if they continue or are bothersome): Flushing Headache Joint pain Muscle pain Nausea Pain, redness, or irritation at injection site This list may not describe all possible side effects. Call your doctor for medical advice about side effects. You may report side effects to FDA at 1-800-FDA-1088. Where should I keep my medication? This medication is given in a hospital or clinic and will not be stored at home. NOTE: This sheet is a summary. It may not cover all possible information. If you have questions about this medicine, talk to your doctor, pharmacist, or health care provider.  2023 Elsevier/Gold Standard (2007-10-09 00:00:00)

## 2022-07-19 ENCOUNTER — Other Ambulatory Visit: Payer: Self-pay | Admitting: Hematology & Oncology

## 2022-07-23 NOTE — Progress Notes (Unsigned)
Office Visit Note  Patient: Rhonda Gutierrez             Date of Birth: [birth year]           MRN: [id number]             PCP: Oren Section, NP-C Referring: Oren Section, NP-C Visit Date: 07/31/2022 Occupation: _0 @  Subjective:  Pain in both hands   History of Present Illness: Rhonda Gutierrez is a 61 y.o. female with history of osteoarthritis, myofascial pain, DDD, and osteoporosis.  Patient presents today experiencing increased pain and stiffness in both hands for the past 2 months.  She has not had any recent injury or change in activity prior to the onset of symptoms.  She states that her stiffness is most severe with inactivity.  She reports that her right ring and left middle fingers have been triggering and tender to the touch.  She would like to schedule ultrasound-guided cortisone injections prior to leaving the country at the end of December 2023.  She currently rates the pain in both hands and 8 out of 10.  She states she has also noticed decreased grip strength and difficulty with fine motor skills.  She states that the pain in her hands has been lasting consistently all day.  She has not noticed any relief with over-the-counter products for pain relief.  She remains on gabapentin as prescribed.  Activities of Daily Living:  Patient reports morning stiffness for all day. Patient Denies nocturnal pain.  Difficulty dressing/grooming: Denies Difficulty climbing stairs: Denies Difficulty getting out of chair: Denies Difficulty using hands for taps, buttons, cutlery, and/or writing: Reports  Review of Systems  Constitutional:  Positive for fatigue.  HENT:  Negative for mouth sores and mouth dryness.   Eyes:  Positive for dryness.  Respiratory:  Negative for shortness of breath.   Cardiovascular:  Negative for chest pain and palpitations.  Gastrointestinal:  Negative for blood in stool, constipation and diarrhea.  Endocrine: Negative for increased urination.   Genitourinary:  Negative for involuntary urination.  Musculoskeletal:  Positive for joint pain, joint pain, joint swelling, muscle weakness and morning stiffness. Negative for gait problem, myalgias, muscle tenderness and myalgias.  Skin:  Positive for rash. Negative for color change and sensitivity to sunlight.  Allergic/Immunologic: Negative for susceptible to infections.  Neurological:  Negative for dizziness and headaches.  Hematological:  Negative for swollen glands.  Psychiatric/Behavioral:  Positive for depressed mood. Negative for sleep disturbance. The patient is nervous/anxious.     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   IDA (iron deficiency anemia) 11/05/2017   Primary osteoarthritis of both hands 07/10/2017   Primary osteoarthritis of both knees 07/10/2017   Plantar fasciitis 07/10/2017   DDD (degenerative disc disease), cervical 07/10/2017   Vitamin D deficiency 07/10/2017   Abnormal laboratory test 07/10/2017   Gastroenteritis presumed infectious 08/12/2015   Nausea vomiting and diarrhea 07/30/2015   ARF (acute renal failure) ([place]) 07/30/2015   Diarrhea 07/30/2015   Acute kidney injury ([place]) 07/29/2015   Pain in joint, ankle and foot 05/03/2013   Type I (juvenile type) diabetes mellitus with renal manifestations, not stated as uncontrolled(250.41) 01/11/2013   Carpal tunnel syndrome, bilateral 10/07/2011   Cough 07/09/2010   PROTEINURIA, MILD 04/10/2009   DISTURBANCE OF SKIN SENSATION 01/02/2009   HYPERLIPIDEMIA  11/21/2008   RENAL INSUFFICIENCY 11/21/2008   Ulcerative colitis ([place]) 03/06/2008   FATTY LIVER DISEASE 03/06/2008   Essential hypertension 04/14/2007    Past Medical History:  Diagnosis Date   Anemia    Breast cancer ([place])    Chronically dry eyes     Dyslipidemia    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   Fatty liver disease, nonalcoholic    Hives    Sleep apnea    Type I (juvenile type) diabetes mellitus without mention of complication, not stated as uncontrolled    Ulcerative colitis, unspecified    Unspecified essential hypertension     Family History  Problem Relation Age of Onset   Cancer Father    Allergy (severe) Daughter    Past Surgical History:  Procedure Laterality Date   APPENDECTOMY     BREAST LUMPECTOMY WITH SENTINEL LYMPH NODE BIOPSY Left 09/27/2019   CARPAL TUNNEL RELEASE Right 09/2011   CARPAL TUNNEL RELEASE Left 07/2016   CERVICAL DISCECTOMY  07/02/2018   c6,c7   CHOLECYSTECTOMY  1993   CYST EXCISION  12/2011   benign cyst removed from right forearm   CYST EXCISION  06/27/2022   lymph node excisional inguinal, right groin   ILEOSTOMY  06/17/2006   take down   Insulin pump     mass removal      right arm, benign   NASAL SINUS SURGERY  02/10/1994   PROTOCOLECTOMY  12/08/2005   total   stoma  2007   reversal of ileostomy   [place]   Social History   Social History Narrative   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 She believes that when she returns to work with much more activity, her glucoses will come down some more.   Immunization History  Administered Date(s) Administered   Influenza Inj Mdck Quad Pf 05/28/2017   Influenza Split 07/08/2011   Influenza Whole 07/02/2008, 06/01/2013   Influenza,inj,Quad PF,6+ Mos 05/22/2019   Influenza-Unspecified 07/08/2011, 05/28/2017, 04/25/2020, 07/23/2022   PFIZER(Purple Top)SARS-COV-2 Vaccination 11/11/2019, 12/02/2019, 06/02/2020, 07/23/2022   Pneumococcal Polysaccharide-23 06/01/2006, 06/01/2013     Objective: Vital Signs: BP 138/84 (BP Location: Right Arm, Patient Position: Sitting, Cuff Size: Normal)   Pulse 83   Ht _0  (1.575 m)   Wt 174 lb 3.2 oz (79 kg)    BMI 31.86 kg/m    Physical Exam Vitals and nursing note reviewed.  Constitutional:      Appearance: She is well-developed.  HENT:     Head: Normocephalic and atraumatic.  Eyes:     Conjunctiva/sclera: Conjunctivae normal.  Cardiovascular:     Rate and Rhythm: Normal rate and regular rhythm.     Heart sounds: Normal heart sounds.  Pulmonary:     Effort: Pulmonary effort is normal.     Breath sounds: Normal breath sounds.  Abdominal:     General: Bowel sounds are normal.     Palpations: Abdomen is soft.  Musculoskeletal:     Cervical back: Normal range of motion.  Skin:    General: Skin is warm and dry.     Capillary Refill: Capillary refill takes less than 2 seconds.  Neurological:     Mental Status: She is alert and oriented to person, place, and time.  Psychiatric:        Behavior: Behavior normal.      Musculoskeletal Exam: C-spine has good range of motion with no discomfort.  Shoulder  joints, elbow joints, and wrist joints have good range of motion with no discomfort. PIP and DIP thickening consistent with osteoarthritis of both hands.  Mild subluxation of several DIP joints noted.  Tenderness over PIP and DIP joints.  Complete fist formation bilaterally.  No tenderness or synovitis over MCP joints.  Hip joints have good range of motion with no groin pain.  Knee joints have good range of motion with no warmth or effusion.  Ankle joints have good range of motion with no tenderness or joint swelling.  CDAI Exam: CDAI Score: -- Patient Global: --; Provider Global: -- Swollen: --; Tender: -- Joint Exam 07/31/2022   No joint exam has been documented for this visit   There is currently no information documented on the homunculus. Go to the Rheumatology activity and complete the homunculus joint exam.  Investigation: No additional findings.  Imaging: No results found.  Recent Labs: Lab Results  Component Value Date   WBC 5.5 07/02/2022   HGB 9.5 (L) 07/02/2022    PLT 188 07/02/2022   NA 135 07/02/2022   K 4.5 07/02/2022   CL 101 07/02/2022   CO2 26 07/02/2022   GLUCOSE 308 (H) 07/02/2022   BUN 16 07/02/2022   CREATININE 0.87 07/02/2022   BILITOT 0.7 07/02/2022   ALKPHOS 144 (H) 07/02/2022   AST 29 07/02/2022   ALT 21 07/02/2022   PROT 7.8 07/02/2022   ALBUMIN 4.1 07/02/2022   CALCIUM 10.3 07/02/2022   GFRAA >60 05/15/2020    Speciality Comments: No specialty comments available.  Procedures:  No procedures performed Allergies: Aspartame, Clarithromycin, Codeine, Mesalamine, Rofecoxib, Sulfasalazine, Fish oil, Losartan, Lyumjev [insulin lispro], Sulfa antibiotics, and Sulfonamide derivatives   Assessment / Plan:     Visit Diagnoses: Primary osteoarthritis of both hands: X-rays of both hands were obtained on 06/17/2017 which were consistent with early osteoarthritic changes.  No erosive changes were noted at this time.  Patient presents today with increased pain and stiffness in both hands which has progressively been worsening over the past 2 months.  Her pain has been consistent on a daily basis and is currently an 8 out of 10.  She has been having tenderness and locking of the right ring and left middle finger consistent with trigger fingers as well.  She has been noticing decreased grip strength and difficulty with fine motor skills due to the severity of pain in her hands.  She is also having difficulty making a complete fist. On examination she has PIP and DIP thickening consistent with osteoarthritis of both hands.  Very mild subluxation of DIP joints of left index and middle finger noted.  No synovitis or dactylitis noted. X-rays of both hands were obtained today for further evaluation.  The following lab work including RF, anti-CCP, sed rate, and CRP will be checked today. She will be scheduled for an ultrasound-guided right ring and left middle trigger finger injection as well. Different treatment options for osteoarthritis of both hands  were discussed today in detail.  Discussed the use of natural anti-inflammatories including turmeric, tart cherry, ginger, and omega-3.  I also discussed the use of arthritis compression gloves as well as referral to hand therapy.  The patient will be moving to [place] once she has found a house so a referral to PT/OT will be avoided at this time.   Pain in both hands -Patient presents today with increased pain and stiffness in both hands for the past 2 months.  X-rays of both hands along with  the following lab work will be obtained today for further evaluation.  She had no synovitis on examination.   Plan: XR Hand 2 View Right, XR Hand 2 View Left, Rheumatoid factor, Cyclic citrul peptide antibody, IgG, Sedimentation rate, C-reactive protein  Trigger finger, right ring finger: She has been experiencing intermittent tenderness and locking.  An ultrasound-guided right ring trigger finger injection will be scheduled.  Trigger finger, left middle finger: Patient presents today with tenderness and locking of the left middle finger.  She has had a cortisone injection in the past which alleviated her symptoms but her symptoms have returned.  She requested to schedule an ultrasound-guided left middle trigger finger injection.  Trigger finger, right middle finger - Injected on 09/05/20.  Patient had bilateral middle trigger finger injections in February 2023 by orthopedic surgeon. Resolved.   Primary osteoarthritis of both knees: She has good range of motion of both knee joints on examination today.  No warmth or effusion noted.  DDD (degenerative disc disease), cervical - s/p discectomy.  Myofascial pain: She continues to experience intermittent myalgias and muscle tenderness due to myofascial pain syndrome. She remains on gabapentin as prescribed.   Age-related osteoporosis without current pathological fracture -She remains on Fosamax 70 mg 1 tablet by mouth once weekly.  She is also taking a calcium and  vitamin D supplement.  History of vitamin D deficiency: She is taking a daily vitamin D supplement.   Other medical conditions are listed as follows:   Other ulcerative colitis without complication (HCC)  History of colectomy  History of fatty infiltration of liver  History of hyperlipidemia  History of diabetes mellitus  History of hypertension  History of chronic kidney disease/ Dr Brayton El manages   History of anxiety  History of gastroesophageal reflux (GERD)  History of breast cancer - left breast, ER positive, PR positive, HER2 negative Oncotype score 13 s/p lobectomy September 27, 2019-followed by Dr. Marin Olp  Other iron deficiency anemia - followed by Dr. Marin Olp    Orders: Orders Placed This Encounter  Procedures   XR Hand 2 View Right   XR Hand 2 View Left   Rheumatoid factor   Cyclic citrul peptide antibody, IgG   Sedimentation rate   C-reactive protein   No orders of the defined types were placed in this encounter.   Follow-Up Instructions: Return in about 6 months (around 01/29/2023) for Osteoarthritis, DDD, Osteoporosis.   Ofilia Neas, PA-C  Note - This record has been created using Dragon software.  Chart creation errors have been sought, but may not always  have been located. Such creation errors do not reflect on  the standard of medical care.

## 2022-07-30 ENCOUNTER — Encounter: Payer: Self-pay | Admitting: Hematology & Oncology

## 2022-07-31 ENCOUNTER — Encounter: Payer: Self-pay | Admitting: Physician Assistant

## 2022-07-31 ENCOUNTER — Ambulatory Visit: Payer: Medicare Other | Attending: Physician Assistant | Admitting: Physician Assistant

## 2022-07-31 ENCOUNTER — Ambulatory Visit (INDEPENDENT_AMBULATORY_CARE_PROVIDER_SITE_OTHER): Payer: Medicare Other

## 2022-07-31 ENCOUNTER — Ambulatory Visit: Payer: Medicare Other

## 2022-07-31 VITALS — BP 138/84 | HR 83 | Ht 62.0 in | Wt 174.2 lb

## 2022-07-31 DIAGNOSIS — M79642 Pain in left hand: Secondary | ICD-10-CM

## 2022-07-31 DIAGNOSIS — M65332 Trigger finger, left middle finger: Secondary | ICD-10-CM | POA: Diagnosis not present

## 2022-07-31 DIAGNOSIS — Z8719 Personal history of other diseases of the digestive system: Secondary | ICD-10-CM

## 2022-07-31 DIAGNOSIS — M5030 Other cervical disc degeneration, unspecified cervical region: Secondary | ICD-10-CM

## 2022-07-31 DIAGNOSIS — Z8659 Personal history of other mental and behavioral disorders: Secondary | ICD-10-CM

## 2022-07-31 DIAGNOSIS — M65341 Trigger finger, right ring finger: Secondary | ICD-10-CM | POA: Diagnosis not present

## 2022-07-31 DIAGNOSIS — M19042 Primary osteoarthritis, left hand: Secondary | ICD-10-CM

## 2022-07-31 DIAGNOSIS — Z9049 Acquired absence of other specified parts of digestive tract: Secondary | ICD-10-CM

## 2022-07-31 DIAGNOSIS — M170 Bilateral primary osteoarthritis of knee: Secondary | ICD-10-CM

## 2022-07-31 DIAGNOSIS — M65331 Trigger finger, right middle finger: Secondary | ICD-10-CM | POA: Diagnosis not present

## 2022-07-31 DIAGNOSIS — M79641 Pain in right hand: Secondary | ICD-10-CM | POA: Diagnosis not present

## 2022-07-31 DIAGNOSIS — K5180 Other ulcerative colitis without complications: Secondary | ICD-10-CM

## 2022-07-31 DIAGNOSIS — D508 Other iron deficiency anemias: Secondary | ICD-10-CM

## 2022-07-31 DIAGNOSIS — M7918 Myalgia, other site: Secondary | ICD-10-CM

## 2022-07-31 DIAGNOSIS — Z853 Personal history of malignant neoplasm of breast: Secondary | ICD-10-CM

## 2022-07-31 DIAGNOSIS — M19041 Primary osteoarthritis, right hand: Secondary | ICD-10-CM | POA: Diagnosis not present

## 2022-07-31 DIAGNOSIS — Z8679 Personal history of other diseases of the circulatory system: Secondary | ICD-10-CM

## 2022-07-31 DIAGNOSIS — M810 Age-related osteoporosis without current pathological fracture: Secondary | ICD-10-CM

## 2022-07-31 DIAGNOSIS — Z8639 Personal history of other endocrine, nutritional and metabolic disease: Secondary | ICD-10-CM

## 2022-07-31 DIAGNOSIS — Z87448 Personal history of other diseases of urinary system: Secondary | ICD-10-CM

## 2022-07-31 NOTE — Patient Instructions (Signed)
Hand Exercises Hand exercises can be helpful for almost anyone. These exercises can strengthen the hands, improve flexibility and movement, and increase blood flow to the hands. These results can make work and daily tasks easier. Hand exercises can be especially helpful for people who have joint pain from arthritis or have nerve damage from overuse (carpal tunnel syndrome). These exercises can also help people who have injured a hand. Exercises Most of these hand exercises are gentle stretching and motion exercises. It is usually safe to do them often throughout the day. Warming up your hands before exercise may help to reduce stiffness. You can do this with gentle massage or by placing your hands in warm water for 10-15 minutes. It is normal to feel some stretching, pulling, tightness, or mild discomfort as you begin new exercises. This will gradually improve. Stop an exercise right away if you feel sudden, severe pain or your pain gets worse. Ask your health care provider which exercises are best for you. Knuckle bend or "claw" fist  Stand or sit with your arm, hand, and all five fingers pointed straight up. Make sure to keep your wrist straight during the exercise. Gently bend your fingers down toward your palm until the tips of your fingers are touching the top of your palm. Keep your big knuckle straight and just bend the small knuckles in your fingers. Hold this position for __________ seconds. Straighten (extend) your fingers back to the starting position. Repeat this exercise 5-10 times with each hand. Full finger fist  Stand or sit with your arm, hand, and all five fingers pointed straight up. Make sure to keep your wrist straight during the exercise. Gently bend your fingers into your palm until the tips of your fingers are touching the middle of your palm. Hold this position for __________ seconds. Extend your fingers back to the starting position, stretching every joint fully. Repeat  this exercise 5-10 times with each hand. Straight fist Stand or sit with your arm, hand, and all five fingers pointed straight up. Make sure to keep your wrist straight during the exercise. Gently bend your fingers at the big knuckle, where your fingers meet your hand, and the middle knuckle. Keep the knuckle at the tips of your fingers straight and try to touch the bottom of your palm. Hold this position for __________ seconds. Extend your fingers back to the starting position, stretching every joint fully. Repeat this exercise 5-10 times with each hand. Tabletop  Stand or sit with your arm, hand, and all five fingers pointed straight up. Make sure to keep your wrist straight during the exercise. Gently bend your fingers at the big knuckle, where your fingers meet your hand, as far down as you can while keeping the small knuckles in your fingers straight. Think of forming a tabletop with your fingers. Hold this position for __________ seconds. Extend your fingers back to the starting position, stretching every joint fully. Repeat this exercise 5-10 times with each hand. Finger spread  Place your hand flat on a table with your palm facing down. Make sure your wrist stays straight as you do this exercise. Spread your fingers and thumb apart from each other as far as you can until you feel a gentle stretch. Hold this position for __________ seconds. Bring your fingers and thumb tight together again. Hold this position for __________ seconds. Repeat this exercise 5-10 times with each hand. Making circles  Stand or sit with your arm, hand, and all five fingers pointed  straight up. Make sure to keep your wrist straight during the exercise. Make a circle by touching the tip of your thumb to the tip of your index finger. Hold for __________ seconds. Then open your hand wide. Repeat this motion with your thumb and each finger on your hand. Repeat this exercise 5-10 times with each hand. Thumb  motion  Sit with your forearm resting on a table and your wrist straight. Your thumb should be facing up toward the ceiling. Keep your fingers relaxed as you move your thumb. Lift your thumb up as high as you can toward the ceiling. Hold for __________ seconds. Bend your thumb across your palm as far as you can, reaching the tip of your thumb for the small finger (pinkie) side of your palm. Hold for __________ seconds. Repeat this exercise 5-10 times with each hand. Grip strengthening  Hold a stress ball or other soft ball in the middle of your hand. Slowly increase the pressure, squeezing the ball as much as you can without causing pain. Think of bringing the tips of your fingers into the middle of your palm. All of your finger joints should bend when doing this exercise. Hold your squeeze for __________ seconds, then relax. Repeat this exercise 5-10 times with each hand. Contact a health care provider if: Your hand pain or discomfort gets much worse when you do an exercise. Your hand pain or discomfort does not improve within 2 hours after you exercise. If you have any of these problems, stop doing these exercises right away. Do not do them again unless your health care provider says that you can. Get help right away if: You develop sudden, severe hand pain or swelling. If this happens, stop doing these exercises right away. Do not do them again unless your health care provider says that you can. This information is not intended to replace advice given to you by your health care provider. Make sure you discuss any questions you have with your health care provider. Document Revised: 12/06/2020 Document Reviewed: 12/06/2020 Elsevier Patient Education  [place].

## 2022-08-01 NOTE — Progress Notes (Signed)
RF is positive.  ESR is elevated.  CRP WNL.   Labs are concerning for rheumatoid arthritis.  Please make the appt on 08/06/22 a full office visit to discuss treatment options and for the ultrasound guided trigger finger injections.   The patient will eventually be moving to [place] so we will need to come up with a plan prior to her moving.

## 2022-08-04 LAB — C-REACTIVE PROTEIN: CRP: 7 mg/L (ref <8.0)

## 2022-08-04 LAB — SEDIMENTATION RATE: Sed Rate: 50 mm/h — ABNORMAL HIGH (ref 0–30)

## 2022-08-04 LAB — CYCLIC CITRUL PEPTIDE ANTIBODY, IGG: Cyclic Citrullin Peptide Ab: <16 UNITS

## 2022-08-04 LAB — RHEUMATOID FACTOR: Rheumatoid fact SerPl-aCnc: 38 IU/mL — ABNORMAL HIGH (ref <14)

## 2022-08-04 NOTE — Progress Notes (Signed)
Anti-CCP negative.

## 2022-08-05 NOTE — Progress Notes (Unsigned)
Office Visit Note  Patient: Rhonda Gutierrez             Date of Birth: Jul 21, 1961           MRN: [id number]             PCP: Oren Section, NP-C Referring: Oren Section, NP-C Visit Date: 08/06/2022 Occupation: _0 @  Subjective:  Pain in both hands  History of Present Illness: Rhonda Gutierrez is a 61 y.o. female with history of osteoarthritis, myofascial pain, disc disease and osteoporosis.  She states she continues to have pain and discomfort in her bilateral hands which is gradually getting worse.  She states the morning stiffness lasts for about 30 minutes.  She has not noticed joint swelling.  She has been having triggering of her right ring and left middle finger.  She states the trigger fingers cause a lot of pain and discomfort.  She has difficulty with grasping small objects.  None of the other joints that is painful.  She had bilateral carpal tunnel release in the past.  She denies any paresthesias in her hands.  Activities of Daily Living:  Patient reports morning stiffness for 30 minutes.   Patient Denies nocturnal pain.  Difficulty dressing/grooming: Denies Difficulty climbing stairs: Denies Difficulty getting out of chair: Denies Difficulty using hands for taps, buttons, cutlery, and/or writing: Denies  Review of Systems  Constitutional:  Positive for fatigue.  HENT:  Negative for mouth sores and mouth dryness.   Eyes:  Positive for dryness.  Respiratory:  Negative for shortness of breath.   Cardiovascular:  Negative for chest pain and palpitations.  Gastrointestinal:  Positive for diarrhea. Negative for constipation.  Endocrine: Negative for increased urination.  Genitourinary:  Negative for difficulty urinating.  Musculoskeletal:  Positive for joint pain, joint pain and morning stiffness. Negative for myalgias and myalgias.  Skin:  Negative for color change and sensitivity to sunlight.  Allergic/Immunologic: Negative for susceptible to infections.   Neurological:  Negative for weakness.  Hematological:  Negative for swollen glands.  Psychiatric/Behavioral:  Positive for depressed mood. Negative for sleep disturbance. The patient is nervous/anxious.     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   IDA (iron deficiency anemia) 11/05/2017   Primary osteoarthritis of both hands 07/10/2017   Primary osteoarthritis of both knees 07/10/2017   Plantar fasciitis 07/10/2017   DDD (degenerative disc disease), cervical 07/10/2017   Vitamin D deficiency 07/10/2017   Abnormal laboratory test 07/10/2017   Gastroenteritis presumed infectious 08/12/2015   Nausea vomiting and diarrhea 07/30/2015   ARF (acute renal failure) ([place]) 07/30/2015   Diarrhea 07/30/2015   Acute kidney injury ([place]) 07/29/2015   Pain in joint, ankle and foot 05/03/2013   Type I (juvenile type) diabetes mellitus with renal manifestations, not stated as uncontrolled(250.41) 01/11/2013   Carpal tunnel syndrome, bilateral 10/07/2011   Cough 07/09/2010   PROTEINURIA, MILD 04/10/2009   DISTURBANCE OF SKIN SENSATION 01/02/2009   HYPERLIPIDEMIA 11/21/2008   RENAL INSUFFICIENCY 11/21/2008   Ulcerative colitis ([place]) 03/06/2008   FATTY LIVER DISEASE 03/06/2008   Essential hypertension 04/14/2007    Past Medical History:  Diagnosis Date   Anemia    Breast cancer ([place])    Chronically dry eyes    Dyslipidemia    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   Fatty liver disease, nonalcoholic  Hives    Sleep apnea    Type I (juvenile type) diabetes mellitus without mention of complication, not stated as uncontrolled    Ulcerative colitis, unspecified    Unspecified essential hypertension     Family History  Problem Relation Age of Onset   Cancer Father    Allergy (severe) Daughter     Past Surgical History:  Procedure Laterality Date   APPENDECTOMY     BREAST LUMPECTOMY WITH SENTINEL LYMPH NODE BIOPSY Left 09/27/2019   CARPAL TUNNEL RELEASE Right 09/2011   CARPAL TUNNEL RELEASE Left 07/2016   CERVICAL DISCECTOMY  07/02/2018   c6,c7   CHOLECYSTECTOMY  1993   CYST EXCISION  12/2011   benign cyst removed from right forearm   CYST EXCISION  06/27/2022   lymph node excisional inguinal, right groin   ILEOSTOMY  06/17/2006   take down   Insulin pump     mass removal      right arm, benign   NASAL SINUS SURGERY  02/10/1994   PROTOCOLECTOMY  12/08/2005   total   stoma  2007   reversal of ileostomy   [place]   Social History   Social History Narrative   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 She believes that when she returns to work with much more activity, her glucoses will come down some more.   Immunization History  Administered Date(s) Administered   Influenza Inj Mdck Quad Pf 05/28/2017   Influenza Split 07/08/2011   Influenza Whole 07/02/2008, 06/01/2013   Influenza,inj,Quad PF,6+ Mos 05/22/2019   Influenza-Unspecified 07/08/2011, 05/28/2017, 04/25/2020, 07/23/2022   PFIZER(Purple Top)SARS-COV-2 Vaccination 11/11/2019, 12/02/2019, 06/02/2020, 07/23/2022   Pneumococcal Polysaccharide-23 06/01/2006, 06/01/2013     Objective: Vital Signs: BP 133/85 (BP Location: Left Arm, Patient Position: Sitting, Cuff Size: Normal)   Pulse 78   Ht _0  (1.575 m)   Wt 173 lb (78.5 kg)   BMI 31.64 kg/m    Physical Exam Vitals and nursing note reviewed.  Constitutional:      Appearance: She is well-developed.  HENT:     Head: Normocephalic and atraumatic.  Eyes:     Conjunctiva/sclera: Conjunctivae normal.  Cardiovascular:     Rate and Rhythm: Normal rate and regular rhythm.     Heart sounds: Normal heart sounds.  Pulmonary:     Effort: Pulmonary effort is normal.     Breath  sounds: Normal breath sounds.  Abdominal:     General: Bowel sounds are normal.     Palpations: Abdomen is soft.  Musculoskeletal:     Cervical back: Normal range of motion.  Lymphadenopathy:     Cervical: No cervical adenopathy.  Skin:    General: Skin is warm and dry.     Capillary Refill: Capillary refill takes less than 2 seconds.  Neurological:     Mental Status: She is alert and oriented to person, place, and time.  Psychiatric:        Behavior: Behavior normal.      Musculoskeletal Exam: Cervical spine was in good range of motion with no discomfort.  Shoulders, elbows, wrist joints with good range of motion with no synovitis.  There was no synovitis in the wrist joints or MCP joints.  She had bilateral PIP and DIP thickening with tenderness over PIP and DIP joints.  She had tenderness over right ring flexor tendon and left middle flexor tendons.  Knee joints were in  good range of motion without any warmth swelling or effusion.  There was no tenderness over ankles or MTPs.  CDAI Exam: CDAI Score: -- Patient Global: --; Provider Global: -- Swollen: --; Tender: -- Joint Exam 08/06/2022   No joint exam has been documented for this visit   There is currently no information documented on the homunculus. Go to the Rheumatology activity and complete the homunculus joint exam.  Investigation: No additional findings.  Imaging: [place] Extrem Up Left Ltd  Result Date: 08/06/2022 Limited ultrasound examination of left hand was performed per EULAR recommendations. Using 15 MHz transducer, grayscale and power Doppler left second,and third MCP joints  both dorsal and volar aspects were evaluated to look for synovitis or tenosynovitis. The findings were there was no synovitis or tenosynovitis on ultrasound examination. Impression: Ultrasound examination did not show synovitis or tenosynovitis.  [place] Extrem Up Right Ltd  Result Date: 08/06/2022 Limited ultrasound examination of right hand was  performed per EULAR recommendations. Using 15 MHz transducer, grayscale and power Doppler right second,and third MCP joints  both dorsal and volar aspects were evaluated to look for synovitis or tenosynovitis. The findings were there was no synovitis or tenosynovitis on ultrasound examination. Impression: Ultrasound examination did not show synovitis or tenosynovitis.  US Guided Needle Placement  Result Date: 08/06/2022 Ultrasound guided injection is preferred based studies that show increased duration, increased effect, greater accuracy, decreased procedural pain, increased response rate, and decreased cost with ultrasound guided versus blind injection.   Verbal informed consent obtained.  Time-out conducted.  Noted no overlying erythema, induration, or other signs of local infection. Ultrasound-guided finger injection: After sterile prep with Betadine, injected 0.5 mL of 1% lidocaine and 10 mg Kenalog using a 27-gauge needle, left middle finger flexor tendon sheath.    US Guided Needle Placement  Result Date: 08/06/2022 Ultrasound guided injection is preferred based studies that show increased duration, increased effect, greater accuracy, decreased procedural pain, increased response rate, and decreased cost with ultrasound guided versus blind injection.   Verbal informed consent obtained.  Time-out conducted.  Noted no overlying erythema, induration, or other signs of local infection. Ultrasound-guided trigger finger injection: After sterile prep with Betadine, injected 0.5 mL of 1% lidocaine and 10 mg Kenalog using a 27-gauge needle, right ring flexor tendon sheath.    XR Hand 2 View Left  Result Date: 08/05/2022 Severe CMC PIP and DIP narrowing was noted.  Erosive changes were noted in the fourth DIP joint.  No MCP, intercarpal or radiocarpal joint space narrowing was noted.  Increased narrowing and erosive changes were noted in the fourth DIP joint when compared to the x-rays of 2018. Impression:  These findings are consistent with severe osteoarthritis of the hand.  XR Hand 2 View Right  Result Date: 08/05/2022 CMC, PIP and DIP narrowing was noted.  No MCP, intercarpal or radiocarpal joint space narrowing was noted.  No erosive changes were noted.  No radiographic progression was noted when compared to the x-rays of 2018. Impression: These findings are consistent with osteoarthritis of the hand.   Recent Labs: Lab Results  Component Value Date   WBC 5.5 07/02/2022   HGB 9.5 (L) 07/02/2022   PLT 188 07/02/2022   NA 135 07/02/2022   K 4.5 07/02/2022   CL 101 07/02/2022   CO2 26 07/02/2022   GLUCOSE 308 (H) 07/02/2022   BUN 16 07/02/2022   CREATININE 0.87 07/02/2022   BILITOT 0.7 07/02/2022   ALKPHOS 144 (H) 07/02/2022   AST  29 07/02/2022   ALT 21 07/02/2022   PROT 7.8 07/02/2022   ALBUMIN 4.1 07/02/2022   CALCIUM 10.3 07/02/2022   GFRAA >60 05/15/2020   07/31/2022 RF38, anti -CCP<16, ESR 50, CRP 7.0  Speciality Comments: No specialty comments available.  Procedures:  Hand/UE Inj: L long A1 for trigger finger on 08/06/2022 4:48 PM Indications: pain, tendon swelling and therapeutic Details: 27 G needle, ultrasound-guided volar approach Medications: 0.5 mL lidocaine 1 %; 10 mg triamcinolone acetonide 40 MG/ML Aspirate: 0 mL Procedure, treatment alternatives, risks and benefits explained, specific risks discussed. Consent was given by the patient. Immediately prior to procedure a time out was called to verify the correct patient, procedure, equipment, support staff and site/side marked as required. Patient was prepped and draped in the usual sterile fashion.    Hand/UE Inj: R ring A1 for trigger finger on 08/06/2022 4:48 PM Indications: pain, tendon swelling and therapeutic Details: 27 G needle, ultrasound-guided volar approach Medications: 0.5 mL lidocaine 1 %; 10 mg triamcinolone acetonide 40 MG/ML Aspirate: 0 mL Consent was given by the patient. Immediately prior to  procedure a time out was called to verify the correct patient, procedure, equipment, support staff and site/side marked as required. Patient was prepped and draped in the usual sterile fashion.     Allergies: Aspartame, Clarithromycin, Codeine, Mesalamine, Rofecoxib, Sulfasalazine, Fish oil, Losartan, Lyumjev [insulin lispro], Sulfa antibiotics, and Sulfonamide derivatives   Assessment / Plan:     Visit Diagnoses: Primary osteoarthritis of both hands - X-rays of both hands were obtained on 06/17/2017 which were consistent with early osteoarthritic changes.  No erosive changes were noted at this time.  Most recent x-rays obtained during the last visit showed severe PIP and DIP narrowing with erosive changes in the left fifth finger DIP joint.  The radiographic findings are consistent with osteoarthritis.  She had no synovitis on my examination.  She has been having significant morning stiffness and discomfort in her bilateral hands.  Recent labs showed elevated sedimentation rate and positive rheumatoid factor but no synovitis was noted.  The lab results were discussed with the patient and her husband.  I also performed ultrasound examination of bilateral second and third MCP joints which did not show any synovitis.  I believe her discomfort is coming from underlying osteoarthritis.  Joint protection muscle strengthening was discussed.  Rheumatoid factor positive-rheumatoid factor is positive and sedimentation rate is elevated.  No synovitis was noted on the examination and normal ultrasound evaluation.  I would like to repeat rheumatoid factor and sedimentation rate in 2 months prior to her next visit.  Pain in both hands-she continues to have pain and discomfort in her bilateral hands.  Joint protection was discussed.  Trigger finger, left middle finger -she has been having triggering and discomfort in her left middle finger.  After different treatment options and side effects were discussed left  middle trigger finger was injected under ultrasound guidance as described above.  Patient tolerated the procedure well.  Postprocedure instructions were given.  She will apply a splint at home.  Plan: US Guided Needle Placement, US Guided Needle Placement  Trigger finger, right ring finger -due to ongoing pain and discomfort and triggering of her right ring finger patient requested a cortisone injection.  She had good results with trigger finger injections in the past.  After informed consent was obtained and side effects were discussed right ring trigger finger was injected with lidocaine and Kenalog as described above.  Patient tolerated the procedure well.  Postprocedure instructions were given.  She was advised to use a splint.  Plan: US Guided Needle Placement, US Guided Needle Placement  Trigger finger, right middle finger - Injected on 09/05/20.  Patient had bilateral middle trigger finger injections in February 2023 by orthopedic surgeon. Resolved.  Primary osteoarthritis of both knees-she denies any discomfort in her knee joints.  No warmth swelling or effusion was noted.  DDD (degenerative disc disease), cervical - s/p discectomy.  She has limited motion of the cervical spine without much discomfort currently.  Myofascial pain-she continues to have generalized pain and discomfort from myofascial pain syndrome.  Age-related osteoporosis without current pathological fracture - Fosamax 70 mg 1 tablet by mouth once weekly.  History of vitamin D deficiency  Other ulcerative colitis without complication (HCC)-patient states ulcerative colitis has not been active since she had colectomy.  Although she does have diarrhea which is expected after colectomy per patient.  History of colectomy  History of diabetes mellitus  History of hyperlipidemia  History of fatty infiltration of liver  History of anxiety  History of chronic kidney disease/ Dr Brayton El manages   History of hypertension-her  blood pressure was normal today.  History of breast cancer - left breast, ER positive, PR positive, HER2 negative Oncotype score 13 s/p lobectomy September 27, 2019-followed by Dr. Marin Olp  History of gastroesophageal reflux (GERD)  Other iron deficiency anemia - followed by Dr. Marin Olp  Orders: Orders Placed This Encounter  Procedures   Hand/UE Inj   Hand/UE Inj   US Guided Needle Placement   US Guided Needle Placement   [place] Extrem Up Left Ltd   [place] Extrem Up Right Ltd   Sedimentation rate   Rheumatoid factor   No orders of the defined types were placed in this encounter.    Follow-Up Instructions: Return in about 2 months (around 10/07/2022) for Osteoarthritis.   Bo Merino, MD  Note - This record has been created using Editor, commissioning.  Chart creation errors have been sought, but may not always  have been located. Such creation errors do not reflect on  the standard of medical care.

## 2022-08-05 NOTE — Progress Notes (Signed)
X-rays of both hands are consistent with severe osteoarthritis of both hands.   Increased narrowing and erosive changes were noted in left 4th DIP compared to 2018.

## 2022-08-06 ENCOUNTER — Encounter: Payer: Self-pay | Admitting: Rheumatology

## 2022-08-06 ENCOUNTER — Ambulatory Visit (INDEPENDENT_AMBULATORY_CARE_PROVIDER_SITE_OTHER): Payer: Medicare Other

## 2022-08-06 ENCOUNTER — Ambulatory Visit: Payer: Medicare Other

## 2022-08-06 ENCOUNTER — Ambulatory Visit: Payer: Medicare Other | Attending: Rheumatology | Admitting: Rheumatology

## 2022-08-06 ENCOUNTER — Encounter: Payer: Self-pay | Admitting: Family

## 2022-08-06 VITALS — BP 133/85 | HR 78 | Ht 62.0 in | Wt 173.0 lb

## 2022-08-06 DIAGNOSIS — M65332 Trigger finger, left middle finger: Secondary | ICD-10-CM

## 2022-08-06 DIAGNOSIS — M79641 Pain in right hand: Secondary | ICD-10-CM

## 2022-08-06 DIAGNOSIS — R768 Other specified abnormal immunological findings in serum: Secondary | ICD-10-CM | POA: Diagnosis not present

## 2022-08-06 DIAGNOSIS — M79642 Pain in left hand: Secondary | ICD-10-CM

## 2022-08-06 DIAGNOSIS — M19041 Primary osteoarthritis, right hand: Secondary | ICD-10-CM

## 2022-08-06 DIAGNOSIS — D508 Other iron deficiency anemias: Secondary | ICD-10-CM

## 2022-08-06 DIAGNOSIS — M65341 Trigger finger, right ring finger: Secondary | ICD-10-CM

## 2022-08-06 DIAGNOSIS — M810 Age-related osteoporosis without current pathological fracture: Secondary | ICD-10-CM

## 2022-08-06 DIAGNOSIS — Z853 Personal history of malignant neoplasm of breast: Secondary | ICD-10-CM

## 2022-08-06 DIAGNOSIS — Z8679 Personal history of other diseases of the circulatory system: Secondary | ICD-10-CM

## 2022-08-06 DIAGNOSIS — Z87448 Personal history of other diseases of urinary system: Secondary | ICD-10-CM

## 2022-08-06 DIAGNOSIS — M170 Bilateral primary osteoarthritis of knee: Secondary | ICD-10-CM

## 2022-08-06 DIAGNOSIS — Z8659 Personal history of other mental and behavioral disorders: Secondary | ICD-10-CM

## 2022-08-06 DIAGNOSIS — Z9049 Acquired absence of other specified parts of digestive tract: Secondary | ICD-10-CM

## 2022-08-06 DIAGNOSIS — M5030 Other cervical disc degeneration, unspecified cervical region: Secondary | ICD-10-CM

## 2022-08-06 DIAGNOSIS — M65331 Trigger finger, right middle finger: Secondary | ICD-10-CM

## 2022-08-06 DIAGNOSIS — K5180 Other ulcerative colitis without complications: Secondary | ICD-10-CM

## 2022-08-06 DIAGNOSIS — Z8639 Personal history of other endocrine, nutritional and metabolic disease: Secondary | ICD-10-CM

## 2022-08-06 DIAGNOSIS — M7918 Myalgia, other site: Secondary | ICD-10-CM

## 2022-08-06 DIAGNOSIS — Z8719 Personal history of other diseases of the digestive system: Secondary | ICD-10-CM

## 2022-08-06 DIAGNOSIS — M19042 Primary osteoarthritis, left hand: Secondary | ICD-10-CM

## 2022-08-06 MED ORDER — TRIAMCINOLONE ACETONIDE 40 MG/ML IJ SUSP
10.0000 mg | INTRAMUSCULAR | Status: AC | PRN
  Administered 2022-08-06: 10 mg

## 2022-08-06 MED ORDER — LIDOCAINE HCL 1 % IJ SOLN
0.5000 mL | INTRAMUSCULAR | Status: AC | PRN
  Administered 2022-08-06: .5 mL

## 2022-08-06 NOTE — Patient Instructions (Addendum)
Please return 1 week prior to the appointment for labs.  Standing Labs We placed an order today for your standing lab work.   Please have your standing labs drawn in February  Please have your labs drawn 2 weeks prior to your appointment so that the provider can discuss your lab results at your appointment.  Please note that you may see your imaging and lab results in [facility] before we have reviewed them. We will contact you once all results are reviewed. Please allow our office up to 72 hours to thoroughly review all of the results before contacting the office for clarification of your results.  Lab hours are:   Monday through Thursday from 8:00 am -12:30 pm and 1:00 pm-5:00 pm and Friday from 8:00 am-12:00 pm.  Please be advised, all patients with office appointments requiring lab work will take precedent over walk-in lab work.   Labs are drawn by [facility]. Please bring your co-pay at the time of your lab draw.  You may receive a bill from [facility] for your lab work.  Please note if you are on Hydroxychloroquine and and an order has been placed for a Hydroxychloroquine level, you will need to have it drawn 4 hours or more after your last dose.  If you wish to have your labs drawn at another location, please call the office 24 hours in advance so we can fax the orders.  The office is located at [address], [place], [place], [place] [address] No appointment is necessary.    If you have any questions regarding directions or hours of operation,  please call [phone].   As a reminder, please drink plenty of water prior to coming for your lab work. Thanks!

## 2022-08-29 ENCOUNTER — Encounter: Payer: Self-pay | Admitting: Hematology & Oncology

## 2022-09-01 ENCOUNTER — Other Ambulatory Visit: Payer: Self-pay | Admitting: Hematology & Oncology

## 2022-09-04 ENCOUNTER — Inpatient Hospital Stay: Payer: Medicare Other

## 2022-09-11 ENCOUNTER — Inpatient Hospital Stay: Payer: Medicare Other | Attending: Hematology & Oncology

## 2022-09-11 VITALS — BP 120/66 | HR 71 | Temp 98.8°F | Resp 20

## 2022-09-11 DIAGNOSIS — D509 Iron deficiency anemia, unspecified: Secondary | ICD-10-CM | POA: Diagnosis present

## 2022-09-11 DIAGNOSIS — D508 Other iron deficiency anemias: Secondary | ICD-10-CM

## 2022-09-11 MED ORDER — SODIUM CHLORIDE 0.9 % IV SOLN: Freq: Once | INTRAVENOUS | Status: AC

## 2022-09-11 MED ORDER — SODIUM CHLORIDE 0.9 % IV SOLN
125.0000 mg | Freq: Once | INTRAVENOUS | Status: AC
  Administered 2022-09-11: 125 mg via INTRAVENOUS
  Filled 2022-09-11: qty 125

## 2022-09-11 NOTE — Patient Instructions (Signed)
Sodium Ferric Gluconate Complex Injection What is this medication? SODIUM FERRIC GLUCONATE COMPLEX (SOE dee um FER ik GLOO koe nate KOM pleks) treats low levels of iron (iron deficiency anemia) in people with kidney disease. Iron is a mineral that plays an important role in making red blood cells, which carry oxygen from your lungs to the rest of your body. This medicine may be used for other purposes; ask your health care provider or pharmacist if you have questions. COMMON BRAND NAME(S): Ferrlecit, Nulecit What should I tell my care team before I take this medication? They need to know if you have any of the following conditions: Anemia that is not from iron deficiency High levels of iron in the blood An unusual or allergic reaction to iron, other medications, foods, dyes, or preservatives Pregnant or are trying to become pregnant Breast-feeding How should I use this medication? This medication is injected into a vein. It is given by your care team in a hospital or clinic setting. Talk to your care team about the use of this medication in children. While it may be prescribed for children as young as 6 years for selected conditions, precautions do apply. Overdosage: If you think you have taken too much of this medicine contact a poison control center or emergency room at once. NOTE: This medicine is only for you. Do not share this medicine with others. What if I miss a dose? It is important not to miss your dose. Call your care team if you are unable to keep an appointment. What may interact with this medication? Do not take this medication with any of the following: Deferasirox Deferoxamine Dimercaprol This medication may also interact with the following: Other iron products This list may not describe all possible interactions. Give your health care provider a list of all the medicines, herbs, non-prescription drugs, or dietary supplements you use. Also tell them if you smoke, drink  alcohol, or use illegal drugs. Some items may interact with your medicine. What should I watch for while using this medication? Your condition will be monitored carefully while you are receiving this medication. Visit your care team for regular checks on your progress. You may need blood work while you are taking this medication. What side effects may I notice from receiving this medication? Side effects that you should report to your care team as soon as possible: Allergic reactions--skin rash, itching, hives, swelling of the face, lips, tongue, or throat Low blood pressure--dizziness, feeling faint or lightheaded, blurry vision Shortness of breath Side effects that usually do not require medical attention (report to your care team if they continue or are bothersome): Flushing Headache Joint pain Muscle pain Nausea Pain, redness, or irritation at injection site This list may not describe all possible side effects. Call your doctor for medical advice about side effects. You may report side effects to FDA at 1-800-FDA-1088. Where should I keep my medication? This medication is given in a hospital or clinic and will not be stored at home. NOTE: This sheet is a summary. It may not cover all possible information. If you have questions about this medicine, talk to your doctor, pharmacist, or health care provider.  2023 Elsevier/Gold Standard (2007-10-09 00:00:00) 

## 2022-09-18 ENCOUNTER — Inpatient Hospital Stay: Payer: Medicare Other

## 2022-09-18 VITALS — BP 135/66 | HR 72 | Temp 97.7°F | Resp 18

## 2022-09-18 DIAGNOSIS — D508 Other iron deficiency anemias: Secondary | ICD-10-CM

## 2022-09-18 DIAGNOSIS — D509 Iron deficiency anemia, unspecified: Secondary | ICD-10-CM | POA: Diagnosis not present

## 2022-09-18 MED ORDER — SODIUM CHLORIDE 0.9 % IV SOLN
125.0000 mg | Freq: Once | INTRAVENOUS | Status: AC
  Administered 2022-09-18: 125 mg via INTRAVENOUS
  Filled 2022-09-18: qty 125

## 2022-09-18 MED ORDER — SODIUM CHLORIDE 0.9 % IV SOLN: Freq: Once | INTRAVENOUS | Status: AC

## 2022-09-18 NOTE — Patient Instructions (Signed)
Sodium Ferric Gluconate Complex Injection What is this medication? SODIUM FERRIC GLUCONATE COMPLEX (SOE dee um FER ik GLOO koe nate KOM pleks) treats low levels of iron (iron deficiency anemia) in people with kidney disease. Iron is a mineral that plays an important role in making red blood cells, which carry oxygen from your lungs to the rest of your body. This medicine may be used for other purposes; ask your health care provider or pharmacist if you have questions. COMMON BRAND NAME(S): Ferrlecit, Nulecit What should I tell my care team before I take this medication? They need to know if you have any of the following conditions: Anemia that is not from iron deficiency High levels of iron in the blood An unusual or allergic reaction to iron, other medications, foods, dyes, or preservatives Pregnant or are trying to become pregnant Breast-feeding How should I use this medication? This medication is injected into a vein. It is given by your care team in a hospital or clinic setting. Talk to your care team about the use of this medication in children. While it may be prescribed for children as young as 6 years for selected conditions, precautions do apply. Overdosage: If you think you have taken too much of this medicine contact a poison control center or emergency room at once. NOTE: This medicine is only for you. Do not share this medicine with others. What if I miss a dose? It is important not to miss your dose. Call your care team if you are unable to keep an appointment. What may interact with this medication? Do not take this medication with any of the following: Deferasirox Deferoxamine Dimercaprol This medication may also interact with the following: Other iron products This list may not describe all possible interactions. Give your health care provider a list of all the medicines, herbs, non-prescription drugs, or dietary supplements you use. Also tell them if you smoke, drink  alcohol, or use illegal drugs. Some items may interact with your medicine. What should I watch for while using this medication? Your condition will be monitored carefully while you are receiving this medication. Visit your care team for regular checks on your progress. You may need blood work while you are taking this medication. What side effects may I notice from receiving this medication? Side effects that you should report to your care team as soon as possible: Allergic reactions--skin rash, itching, hives, swelling of the face, lips, tongue, or throat Low blood pressure--dizziness, feeling faint or lightheaded, blurry vision Shortness of breath Side effects that usually do not require medical attention (report to your care team if they continue or are bothersome): Flushing Headache Joint pain Muscle pain Nausea Pain, redness, or irritation at injection site This list may not describe all possible side effects. Call your doctor for medical advice about side effects. You may report side effects to FDA at 1-800-FDA-1088. Where should I keep my medication? This medication is given in a hospital or clinic and will not be stored at home. NOTE: This sheet is a summary. It may not cover all possible information. If you have questions about this medicine, talk to your doctor, pharmacist, or health care provider.  2023 Elsevier/Gold Standard (2007-10-09 00:00:00) 

## 2022-09-29 NOTE — Progress Notes (Deleted)
Office Visit Note  Patient: Rhonda Gutierrez             Date of Birth: [birth year]           MRN: [id number]             PCP: Oren Section, NP-C Referring: Oren Section, NP-C Visit Date: 10/02/2022 Occupation: '@GUAROCC'$ @  Subjective:  No chief complaint on file.   History of Present Illness: Rhonda Gutierrez is a 62 y.o. female ***     Activities of Daily Living:  Patient reports morning stiffness for *** {minute/hour:19697}.   Patient {ACTIONS;DENIES/REPORTS:21021675::"Denies"} nocturnal pain.  Difficulty dressing/grooming: {ACTIONS;DENIES/REPORTS:21021675::"Denies"} Difficulty climbing stairs: {ACTIONS;DENIES/REPORTS:21021675::"Denies"} Difficulty getting out of chair: {ACTIONS;DENIES/REPORTS:21021675::"Denies"} Difficulty using hands for taps, buttons, cutlery, and/or writing: {ACTIONS;DENIES/REPORTS:21021675::"Denies"}  No Rheumatology ROS completed.   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   IDA (iron deficiency anemia) 11/05/2017   Primary osteoarthritis of both hands 07/10/2017   Primary osteoarthritis of both knees 07/10/2017   Plantar fasciitis 07/10/2017   DDD (degenerative disc disease), cervical 07/10/2017   Vitamin D deficiency 07/10/2017   Abnormal laboratory test 07/10/2017   Gastroenteritis presumed infectious 08/12/2015   Nausea vomiting and diarrhea 07/30/2015   ARF (acute renal failure) ([place]) 07/30/2015   Diarrhea 07/30/2015   Acute kidney injury ([place]) 07/29/2015   Pain in joint, ankle and foot 05/03/2013   Type I (juvenile type) diabetes mellitus with renal manifestations, not stated as uncontrolled(250.41) 01/11/2013   Carpal tunnel syndrome, bilateral 10/07/2011   Cough 07/09/2010   PROTEINURIA, MILD 04/10/2009   DISTURBANCE OF SKIN SENSATION  01/02/2009   HYPERLIPIDEMIA 11/21/2008   RENAL INSUFFICIENCY 11/21/2008   Ulcerative colitis ([place]) 03/06/2008   FATTY LIVER DISEASE 03/06/2008   Essential hypertension 04/14/2007    Past Medical History:  Diagnosis Date   Anemia    Breast cancer ([place])    Chronically dry eyes    Dyslipidemia    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   Fatty liver disease, nonalcoholic    Hives    Sleep apnea    Type I (juvenile type) diabetes mellitus without mention of complication, not stated as uncontrolled    Ulcerative colitis, unspecified    Unspecified essential hypertension     Family History  Problem Relation Age of Onset   Cancer Father    Allergy (severe) Daughter    Past Surgical History:  Procedure Laterality Date   APPENDECTOMY     BREAST LUMPECTOMY WITH SENTINEL LYMPH NODE BIOPSY Left 09/27/2019   CARPAL TUNNEL RELEASE Right 09/2011   CARPAL TUNNEL RELEASE Left 07/2016   CERVICAL DISCECTOMY  07/02/2018   c6,c7   CHOLECYSTECTOMY  1993   CYST EXCISION  12/2011   benign cyst removed from right forearm   CYST EXCISION  06/27/2022   lymph node excisional inguinal, right groin   ILEOSTOMY  06/17/2006   take down   Insulin pump     mass removal      right arm, benign   NASAL SINUS SURGERY  02/10/1994   PROTOCOLECTOMY  12/08/2005   total   stoma  2007   reversal of ileostomy   [place]   Social History   Social History Narrative   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  She believes that when she returns to work with much more activity, her glucoses will come down some more.   Immunization History  Administered Date(s) Administered   Influenza Inj Mdck Quad Pf 05/28/2017   Influenza Split 07/08/2011   Influenza Whole 07/02/2008, 06/01/2013   Influenza,inj,Quad PF,6+ Mos 05/22/2019   Influenza-Unspecified 07/08/2011, 05/28/2017, 04/25/2020, 07/23/2022   PFIZER(Purple Top)SARS-COV-2  Vaccination 11/11/2019, 12/02/2019, 06/02/2020, 07/23/2022   Pneumococcal Polysaccharide-23 06/01/2006, 06/01/2013     Objective: Vital Signs: There were no vitals taken for this visit.   Physical Exam   Musculoskeletal Exam: ***  CDAI Exam: CDAI Score: -- Patient Global: --; Provider Global: -- Swollen: --; Tender: -- Joint Exam 10/02/2022   No joint exam has been documented for this visit   There is currently no information documented on the homunculus. Go to the Rheumatology activity and complete the homunculus joint exam.  Investigation: No additional findings.  Imaging: No results found.  Recent Labs: Lab Results  Component Value Date   WBC 5.5 07/02/2022   HGB 9.5 (L) 07/02/2022   PLT 188 07/02/2022   NA 135 07/02/2022   K 4.5 07/02/2022   CL 101 07/02/2022   CO2 26 07/02/2022   GLUCOSE 308 (H) 07/02/2022   BUN 16 07/02/2022   CREATININE 0.87 07/02/2022   BILITOT 0.7 07/02/2022   ALKPHOS 144 (H) 07/02/2022   AST 29 07/02/2022   ALT 21 07/02/2022   PROT 7.8 07/02/2022   ALBUMIN 4.1 07/02/2022   CALCIUM 10.3 07/02/2022   GFRAA >60 05/15/2020    Speciality Comments: No specialty comments available.  Procedures:  No procedures performed Allergies: Aspartame, Clarithromycin, Codeine, Mesalamine, Rofecoxib, Sulfasalazine, Fish oil, Losartan, Lyumjev [insulin lispro], Sulfa antibiotics, and Sulfonamide derivatives   Assessment / Plan:     Visit Diagnoses: No diagnosis found.  Orders: No orders of the defined types were placed in this encounter.  No orders of the defined types were placed in this encounter.   Face-to-face time spent with patient was *** minutes. Greater than 50% of time was spent in counseling and coordination of care.  Follow-Up Instructions: No follow-ups on file.   Earnestine Mealing, CMA  Note - This record has been created using Editor, commissioning.  Chart creation errors have been sought, but may not always  have been located.  Such creation errors do not reflect on  the standard of medical care.

## 2022-10-02 ENCOUNTER — Ambulatory Visit: Payer: Medicare Other | Admitting: Rheumatology

## 2022-10-02 DIAGNOSIS — Z853 Personal history of malignant neoplasm of breast: Secondary | ICD-10-CM

## 2022-10-02 DIAGNOSIS — K5180 Other ulcerative colitis without complications: Secondary | ICD-10-CM

## 2022-10-02 DIAGNOSIS — D508 Other iron deficiency anemias: Secondary | ICD-10-CM

## 2022-10-02 DIAGNOSIS — M810 Age-related osteoporosis without current pathological fracture: Secondary | ICD-10-CM

## 2022-10-02 DIAGNOSIS — M79641 Pain in right hand: Secondary | ICD-10-CM

## 2022-10-02 DIAGNOSIS — M5030 Other cervical disc degeneration, unspecified cervical region: Secondary | ICD-10-CM

## 2022-10-02 DIAGNOSIS — Z8659 Personal history of other mental and behavioral disorders: Secondary | ICD-10-CM

## 2022-10-02 DIAGNOSIS — Z87448 Personal history of other diseases of urinary system: Secondary | ICD-10-CM

## 2022-10-02 DIAGNOSIS — R768 Other specified abnormal immunological findings in serum: Secondary | ICD-10-CM

## 2022-10-02 DIAGNOSIS — Z8639 Personal history of other endocrine, nutritional and metabolic disease: Secondary | ICD-10-CM

## 2022-10-02 DIAGNOSIS — M65341 Trigger finger, right ring finger: Secondary | ICD-10-CM

## 2022-10-02 DIAGNOSIS — Z8719 Personal history of other diseases of the digestive system: Secondary | ICD-10-CM

## 2022-10-02 DIAGNOSIS — M65331 Trigger finger, right middle finger: Secondary | ICD-10-CM

## 2022-10-02 DIAGNOSIS — Z8679 Personal history of other diseases of the circulatory system: Secondary | ICD-10-CM

## 2022-10-02 DIAGNOSIS — M19041 Primary osteoarthritis, right hand: Secondary | ICD-10-CM

## 2022-10-02 DIAGNOSIS — M7918 Myalgia, other site: Secondary | ICD-10-CM

## 2022-10-02 DIAGNOSIS — M65332 Trigger finger, left middle finger: Secondary | ICD-10-CM

## 2022-10-02 DIAGNOSIS — Z9049 Acquired absence of other specified parts of digestive tract: Secondary | ICD-10-CM

## 2022-10-02 DIAGNOSIS — M170 Bilateral primary osteoarthritis of knee: Secondary | ICD-10-CM

## 2022-10-06 ENCOUNTER — Other Ambulatory Visit: Payer: Self-pay | Admitting: Hematology & Oncology

## 2022-10-08 ENCOUNTER — Ambulatory Visit: Payer: Medicare Other | Admitting: Physician Assistant

## 2022-10-20 ENCOUNTER — Ambulatory Visit: Payer: Medicare Other | Admitting: Physician Assistant

## 2022-10-23 ENCOUNTER — Ambulatory Visit: Payer: Medicare Other | Attending: Rheumatology | Admitting: Rheumatology

## 2022-10-23 ENCOUNTER — Ambulatory Visit: Payer: Medicare Other

## 2022-10-23 DIAGNOSIS — M65331 Trigger finger, right middle finger: Secondary | ICD-10-CM | POA: Diagnosis not present

## 2022-10-23 MED ORDER — LIDOCAINE HCL 1 % IJ SOLN
0.5000 mL | INTRAMUSCULAR | Status: AC | PRN
  Administered 2022-10-23: .5 mL

## 2022-10-23 MED ORDER — TRIAMCINOLONE ACETONIDE 40 MG/ML IJ SUSP
10.0000 mg | INTRAMUSCULAR | Status: AC | PRN
  Administered 2022-10-23: 10 mg

## 2022-10-23 NOTE — Progress Notes (Signed)
   Procedure Note  Patient: Rhonda Gutierrez             Date of Birth: 1961-07-15           MRN: [id number]             Visit Date: 10/23/2022  Procedures: Visit Diagnoses:  1. Trigger finger, right middle finger    Ultrasound guided injection is preferred based studies that show increased duration, increased effect, greater accuracy, decreased procedural pain, increased response rate, and decreased cost with ultrasound guided versus blind injection.   Verbal informed consent obtained.  Time-out conducted.  Noted no overlying erythema, induration, or other signs of local infection. Ultrasound-guided trigger finger injection: After sterile prep with Betadine, injected 0.5 mL of 1% lidocaine and 10 mg Kenalog using a 27-gauge needle, and the flexor tendon sheath.    Hand/UE Inj: R long A1 for trigger finger on 10/23/2022 3:42 PM Indications: pain, tendon swelling and therapeutic Details: 27 G needle, ultrasound-guided volar approach Medications: 0.5 mL lidocaine 1 %; 10 mg triamcinolone acetonide 40 MG/ML Aspirate: 0 mL Procedure, treatment alternatives, risks and benefits explained, specific risks discussed. Immediately prior to procedure a time out was called to verify the correct patient, procedure, equipment, support staff and site/side marked as required. Patient was prepped and draped in the usual sterile fashion.     Patient tolerated the procedure well.  Postprocedure instructions were given.  She has a splint at home which she will apply.  Bo Merino, MD

## 2022-10-31 ENCOUNTER — Encounter: Payer: Self-pay | Admitting: Hematology & Oncology

## 2022-10-31 ENCOUNTER — Inpatient Hospital Stay: Payer: Medicare Other | Attending: Hematology & Oncology

## 2022-10-31 ENCOUNTER — Other Ambulatory Visit: Payer: Self-pay

## 2022-10-31 ENCOUNTER — Inpatient Hospital Stay: Payer: Medicare Other | Admitting: Hematology & Oncology

## 2022-10-31 VITALS — BP 129/72 | HR 65 | Temp 98.6°F | Resp 18 | Ht 62.0 in | Wt 178.0 lb

## 2022-10-31 DIAGNOSIS — N182 Chronic kidney disease, stage 2 (mild): Secondary | ICD-10-CM | POA: Insufficient documentation

## 2022-10-31 DIAGNOSIS — K5100 Ulcerative (chronic) pancolitis without complications: Secondary | ICD-10-CM

## 2022-10-31 DIAGNOSIS — Z79811 Long term (current) use of aromatase inhibitors: Secondary | ICD-10-CM | POA: Insufficient documentation

## 2022-10-31 DIAGNOSIS — K2970 Gastritis, unspecified, without bleeding: Secondary | ICD-10-CM | POA: Insufficient documentation

## 2022-10-31 DIAGNOSIS — D631 Anemia in chronic kidney disease: Secondary | ICD-10-CM | POA: Insufficient documentation

## 2022-10-31 DIAGNOSIS — Z170 Estrogen receptor positive status [ER+]: Secondary | ICD-10-CM | POA: Insufficient documentation

## 2022-10-31 DIAGNOSIS — C50912 Malignant neoplasm of unspecified site of left female breast: Secondary | ICD-10-CM | POA: Insufficient documentation

## 2022-10-31 DIAGNOSIS — K909 Intestinal malabsorption, unspecified: Secondary | ICD-10-CM | POA: Diagnosis not present

## 2022-10-31 DIAGNOSIS — Z794 Long term (current) use of insulin: Secondary | ICD-10-CM | POA: Insufficient documentation

## 2022-10-31 DIAGNOSIS — E1122 Type 2 diabetes mellitus with diabetic chronic kidney disease: Secondary | ICD-10-CM | POA: Diagnosis not present

## 2022-10-31 LAB — CBC WITH DIFFERENTIAL (CANCER CENTER ONLY)
Abs Immature Granulocytes: 0.03 10*3/uL (ref 0.00–0.07)
Basophils Absolute: 0.1 10*3/uL (ref 0.0–0.1)
Basophils Relative: 1 %
Eosinophils Absolute: 0.5 10*3/uL (ref 0.0–0.5)
Eosinophils Relative: 10 %
HCT: 32 % — ABNORMAL LOW (ref 36.0–46.0)
Hemoglobin: 9.7 g/dL — ABNORMAL LOW (ref 12.0–15.0)
Immature Granulocytes: 1 %
Lymphocytes Relative: 20 %
Lymphs Abs: 1.1 10*3/uL (ref 0.7–4.0)
MCH: 26.4 pg (ref 26.0–34.0)
MCHC: 30.3 g/dL (ref 30.0–36.0)
MCV: 87 fL (ref 80.0–100.0)
Monocytes Absolute: 0.4 10*3/uL (ref 0.1–1.0)
Monocytes Relative: 7 %
Neutro Abs: 3.2 10*3/uL (ref 1.7–7.7)
Neutrophils Relative %: 61 %
Platelet Count: 228 10*3/uL (ref 150–400)
RBC: 3.68 MIL/uL — ABNORMAL LOW (ref 3.87–5.11)
RDW: 14.6 % (ref 11.5–15.5)
WBC Count: 5.2 10*3/uL (ref 4.0–10.5)
nRBC: 0 % (ref 0.0–0.2)

## 2022-10-31 LAB — CMP (CANCER CENTER ONLY)
ALT: 17 U/L (ref 0–44)
AST: 20 U/L (ref 15–41)
Albumin: 4.2 g/dL (ref 3.5–5.0)
Alkaline Phosphatase: 130 U/L — ABNORMAL HIGH (ref 38–126)
Anion gap: 8 (ref 5–15)
BUN: 21 mg/dL — ABNORMAL HIGH (ref 6–20)
CO2: 29 mmol/L (ref 22–32)
Calcium: 10.4 mg/dL — ABNORMAL HIGH (ref 8.9–10.3)
Chloride: 105 mmol/L (ref 98–111)
Creatinine: 0.8 mg/dL (ref 0.44–1.00)
GFR, Estimated: >60 mL/min (ref >60)
Glucose, Bld: 129 mg/dL — ABNORMAL HIGH (ref 70–99)
Potassium: 4.5 mmol/L (ref 3.5–5.1)
Sodium: 142 mmol/L (ref 135–145)
Total Bilirubin: 0.4 mg/dL (ref 0.3–1.2)
Total Protein: 7.3 g/dL (ref 6.5–8.1)

## 2022-10-31 LAB — RETICULOCYTES
Immature Retic Fract: 26.2 % — ABNORMAL HIGH (ref 2.3–15.9)
RBC.: 3.6 MIL/uL — ABNORMAL LOW (ref 3.87–5.11)
Retic Count, Absolute: 65.9 10*3/uL (ref 19.0–186.0)
Retic Ct Pct: 1.8 % (ref 0.4–3.1)

## 2022-10-31 LAB — FERRITIN: Ferritin: 48 ng/mL (ref 11–307)

## 2022-10-31 NOTE — Progress Notes (Signed)
Hematology and Oncology Follow Up Visit  Rhonda Gutierrez [id number] 1961-01-01 62 y.o. 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 daily   Interim History:  Rhonda Gutierrez is here today for follow-up.  She apparently had a biopsy of the left breast back in December.  At therapy, this came back as fat necrosis.  We are still trying to monitor her blood counts.  Her hemoglobin is only 9.7.  She has had low iron.  She has a hard time absorbing iron.  We give her iron infusions.  The last iron infusion was done back in January.  At that time, think her iron saturation was 9%.  She does have a low erythropoietin level.  Back when we checked it in 2019, refill level was only 11.  I am sure this is all from her diabetes.  I suspect we probably had to be more aggressive with the Aranesp with her.  She has had no other issues.  She is still working.  She does have the insulin pump.  Blood sugar is much better today.  She has had no problems with COVID.  There is been no change in bowel or bladder habits.  She has had no rashes.  There has been a little bit of leg swelling.  Overall, I would say that her performance status is probably ECOG 0.   Medications:  Allergies as of 10/31/2022       Reactions   Aspartame Other (See Comments)   shaking   Clarithromycin Rash   Codeine Nausea And Vomiting   Mesalamine Other (See Comments)   "Sees things"   Rofecoxib Hives, Other (See Comments)   Hallucinations   Sulfasalazine Rash, Other (See Comments)    fever   Fish Oil Cough   Losartan Cough   Lyumjev [insulin Lispro] Rash   Sulfa Antibiotics Rash, Other (See Comments)   fever   Sulfonamide Derivatives Rash, Other (See Comments)   fever        Medication List        Accurate as of October 31, 2022  2:17 PM. If you have any questions, ask your nurse or doctor.          alendronate 70 MG tablet Commonly known as: FOSAMAX Take 70 mg by mouth once a week. Take with a full glass of water on an empty stomach.   ALPRAZolam 0.25 MG tablet Commonly known as: XANAX Take 0.25 mg by mouth 3 (three) times daily as needed.   anastrozole 1 MG tablet Commonly known as: ARIMIDEX TAKE 1 TABLET BY MOUTH IN THE MORNING   aspirin 81 MG tablet Take 81 mg by mouth daily.   atorvastatin 80 MG tablet Commonly known as: LIPITOR Take 80 mg by mouth daily.   Baqsimi Two Pack 3 MG/DOSE Powd Generic drug: Glucagon Place into the nose.   calcium carbonate 500 MG chewable tablet Commonly known as: TUMS - dosed in mg elemental calcium Chew 3 tablets by mouth daily as needed.   calcium-vitamin D 500-200 MG-UNIT tablet Commonly known as:  OSCAL WITH D Take 1 tablet by mouth 2 (two) times daily.   cetirizine 10 MG tablet Commonly known as: ZYRTEC Take 10 mg by mouth daily as needed.   ciprofloxacin 500 MG tablet Commonly known as: CIPRO Take 500 mg by mouth as needed (For J pouch infection).   cyanocobalamin 1000 MCG/ML injection Commonly known as: VITAMIN B12 Inject into the muscle every 30 (thirty) days.   desonide 0.05 % cream Commonly known as: DESOWEN Apply topically 2 (two) times daily.   Dexcom G6 Receiver Devi by Does not apply route.   Dexcom G6 Sensor Misc by Does not apply route.   escitalopram 20 MG tablet Commonly known as: LEXAPRO Take 20 mg by mouth daily.   famotidine 20 MG tablet Commonly known as: PEPCID Take 20 mg by mouth 2 (two) times daily.   fenofibrate 160 MG tablet Take 160 mg by mouth daily.    fluconazole 100 MG tablet Commonly known as: DIFLUCAN Take 1 tablet (100 mg total) by mouth daily.   fluticasone 50 MCG/ACT nasal spray Commonly known as: FLONASE Place into both nostrils daily.   folic acid 1 MG tablet Commonly known as: FOLVITE Take 1 mg by mouth daily.   gabapentin 600 MG tablet Commonly known as: NEURONTIN Take 600 mg by mouth 3 (three) times daily.   Hyoscyamine Sulfate SL 0.125 MG Subl Place 0.125 mg under the tongue every 6 (six) hours as needed.   insulin lispro 100 UNIT/ML KiwkPen Commonly known as: HumaLOG KwikPen Use as directed if pump does not work   HumaLOG 100 UNIT/ML injection Generic drug: insulin lispro USE IN INSULIN PUMP FOR A TOTAL OF 230 UNITS PER DAY   loperamide 2 MG capsule Commonly known as: IMODIUM Take 6 mg by mouth 2 (two) times daily.   Magnesium Oxide 400 MG Caps daily.   metroNIDAZOLE 500 MG tablet Commonly known as: FLAGYL Take 500 mg by mouth as needed.   multivitamin tablet Take 1 tablet by mouth daily.   nitroGLYCERIN 0.4 mg/hr patch Commonly known as: NITRODUR - Dosed in mg/24 hr Place 0.4 mg onto the skin as needed.   nystatin powder Commonly known as: MYCOSTATIN/NYSTOP Apply 1 Application topically 3 (three) times daily.   omeprazole 40 MG capsule Commonly known as: PRILOSEC Take 40 mg by mouth daily.   ondansetron 8 MG tablet Commonly known as: ZOFRAN Take 8 mg by mouth daily as needed.   pantoprazole 40 MG tablet Commonly known as: PROTONIX Take by mouth.   pantoprazole 40 MG tablet Commonly known as: PROTONIX Take 1 tablet by mouth 2 (two) times daily.   ramipril 2.5 MG capsule Commonly known as: ALTACE Take 2.5 mg by mouth 2 (two) times daily. Further refills through PCP.   simethicone 125 MG chewable tablet Commonly known as: MYLICON Chew 0000000 mg by mouth as needed for flatulence.   tretinoin 0.05 % cream Commonly known as: RETIN-A APPLY ONE APPLICATION EVERY NIGHT AT BEDTIME.    VITAMIN D PO Take by mouth daily.   Vitamin D3 25 MCG (1000 UT) capsule Generic drug: Cholecalciferol Take 1,000 Units by mouth daily.        Allergies:  Allergies  Allergen Reactions   Aspartame Other (See Comments)    shaking   Clarithromycin Rash   Codeine Nausea And Vomiting   Mesalamine Other (See Comments)    "Sees things"   Rofecoxib Hives and Other (See Comments)    Hallucinations   Sulfasalazine Rash and Other (See Comments)  fever   Fish Oil Cough   Losartan Cough   Lyumjev [Insulin Lispro] Rash   Sulfa Antibiotics Rash and Other (See Comments)    fever    Sulfonamide Derivatives Rash and Other (See Comments)    fever    Past Medical History, Surgical history, Social history, and Family History were reviewed and updated.  Review of Systems: Review of Systems  Constitutional:  Positive for malaise/fatigue.  HENT: Negative.    Eyes: Negative.   Respiratory: Negative.    Cardiovascular: Negative.   Gastrointestinal: Negative.   Genitourinary: Negative.   Musculoskeletal: Negative.   Skin: Negative.   Neurological: Negative.   Endo/Heme/Allergies: Negative.   Psychiatric/Behavioral: Negative.       Physical Exam:  vitals were not taken for this visit.   Wt Readings from Last 3 Encounters:  08/06/22 173 lb (78.5 kg)  07/31/22 174 lb 3.2 oz (79 kg)  04/09/22 175 lb (79.4 kg)    Physical Exam Vitals reviewed.  Constitutional:      Comments: Her breast exam shows right breast no masses, edema or erythema.  There is no right axillary adenopathy.  Left breast shows the lumpectomy scar at about the 2 o'clock position.  This is well-healed.  There is some slight contraction of the left breast from radiation.  She does have some erythema under the left breast.  She has a well-healed lumpectomy scar.  There is no fullness in the left axilla.  HENT:     Head: Normocephalic and atraumatic.  Eyes:     Pupils: Pupils are equal, round, and reactive to  light.  Cardiovascular:     Rate and Rhythm: Normal rate and regular rhythm.     Heart sounds: Normal heart sounds.  Pulmonary:     Effort: Pulmonary effort is normal.     Breath sounds: Normal breath sounds.  Abdominal:     General: Bowel sounds are normal.     Palpations: Abdomen is soft.  Musculoskeletal:        General: No tenderness or deformity. Normal range of motion.     Cervical back: Normal range of motion.  Lymphadenopathy:     Cervical: No cervical adenopathy.  Skin:    General: Skin is warm and dry.     Findings: No erythema or rash.  Neurological:     Mental Status: She is alert and oriented to person, place, and time.  Psychiatric:        Behavior: Behavior normal.        Thought Content: Thought content normal.        Judgment: Judgment normal.      Lab Results  Component Value Date   WBC 5.2 10/31/2022   HGB 9.7 (L) 10/31/2022   HCT 32.0 (L) 10/31/2022   MCV 87.0 10/31/2022   PLT 228 10/31/2022   Lab Results  Component Value Date   FERRITIN 51 07/02/2022   IRON 40 07/02/2022   TIBC 463 (H) 07/02/2022   UIBC 423 07/02/2022   IRONPCTSAT 9 (L) 07/02/2022   Lab Results  Component Value Date   RETICCTPCT 1.8 10/31/2022   RBC 3.60 (L) 10/31/2022   No results found for: "KPAFRELGTCHN", "LAMBDASER", "KAPLAMBRATIO" No results found for: "IGGSERUM", "IGA", "IGMSERUM" No results found for: "TOTALPROTELP", "ALBUMINELP", "A1GS", "A2GS", "BETS", "BETA2SER", "GAMS", "MSPIKE", "SPEI"   Chemistry      Component Value Date/Time   NA 142 10/31/2022 1333   K 4.5 10/31/2022 1333   CL 105 10/31/2022 1333  CO2 29 10/31/2022 1333   BUN 21 (H) 10/31/2022 1333   CREATININE 0.80 10/31/2022 1333      Component Value Date/Time   CALCIUM 10.4 (H) 10/31/2022 1333   CALCIUM 9.7 04/08/2011 1053   ALKPHOS 130 (H) 10/31/2022 1333   AST 20 10/31/2022 1333   ALT 17 10/31/2022 1333   BILITOT 0.4 10/31/2022 1333      Impression and Plan: Rhonda Gutierrez is a very  pleasant 62 yo caucasian female with 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  Her recent problem is the early stage-stage I-infiltrating ductal carcinoma of the left breast.  This was found incidentally.  She underwent a lumpectomy and had radiation therapy.  All this was completed in April 2021.  She is on Arimidex.  I think the real problem right now is this anemia.  Will get have to be aggressive with the iron and the Aranesp.  We have had to try to get her set up with a program to give her Aranesp every 3 weeks.  I really think this will help her out.  Hopefully we can start everything in a week or so.  When I had to see her back myself probably in about 55-monthso we can see how she has done with respect to her anemia.    PVolanda Napoleon MD 3/1/20242:17 PM

## 2022-11-01 ENCOUNTER — Other Ambulatory Visit: Payer: Self-pay | Admitting: Hematology & Oncology

## 2022-11-03 LAB — IRON AND IRON BINDING CAPACITY (CC-WL,HP ONLY)
Iron: 51 ug/dL (ref 28–170)
Saturation Ratios: 10 % — ABNORMAL LOW (ref 10.4–31.8)
TIBC: 494 ug/dL — ABNORMAL HIGH (ref 250–450)
UIBC: 443 ug/dL — ABNORMAL HIGH (ref 148–442)

## 2022-11-07 ENCOUNTER — Inpatient Hospital Stay: Payer: Medicare Other

## 2022-11-07 VITALS — BP 120/63 | HR 62 | Temp 98.2°F | Resp 18

## 2022-11-07 DIAGNOSIS — C50912 Malignant neoplasm of unspecified site of left female breast: Secondary | ICD-10-CM | POA: Diagnosis not present

## 2022-11-07 DIAGNOSIS — D508 Other iron deficiency anemias: Secondary | ICD-10-CM

## 2022-11-07 MED ORDER — SODIUM CHLORIDE 0.9 % IV SOLN
125.0000 mg | Freq: Once | INTRAVENOUS | Status: AC
  Administered 2022-11-07: 125 mg via INTRAVENOUS
  Filled 2022-11-07: qty 125

## 2022-11-07 MED ORDER — SODIUM CHLORIDE 0.9 % IV SOLN: Freq: Once | INTRAVENOUS | Status: AC

## 2022-11-07 NOTE — Patient Instructions (Signed)
Sodium Ferric Gluconate Complex Injection What is this medication? SODIUM FERRIC GLUCONATE COMPLEX (SOE dee um FER ik GLOO koe nate KOM pleks) treats low levels of iron (iron deficiency anemia) in people with kidney disease. Iron is a mineral that plays an important role in making red blood cells, which carry oxygen from your lungs to the rest of your body. This medicine may be used for other purposes; ask your health care provider or pharmacist if you have questions. COMMON BRAND NAME(S): Ferrlecit, Nulecit What should I tell my care team before I take this medication? They need to know if you have any of the following conditions: Anemia that is not from iron deficiency High levels of iron in the blood An unusual or allergic reaction to iron, other medications, foods, dyes, or preservatives Pregnant or are trying to become pregnant Breast-feeding How should I use this medication? This medication is injected into a vein. It is given by your care team in a hospital or clinic setting. Talk to your care team about the use of this medication in children. While it may be prescribed for children as young as 6 years for selected conditions, precautions do apply. Overdosage: If you think you have taken too much of this medicine contact a poison control center or emergency room at once. NOTE: This medicine is only for you. Do not share this medicine with others. What if I miss a dose? It is important not to miss your dose. Call your care team if you are unable to keep an appointment. What may interact with this medication? Do not take this medication with any of the following: Deferasirox Deferoxamine Dimercaprol This medication may also interact with the following: Other iron products This list may not describe all possible interactions. Give your health care provider a list of all the medicines, herbs, non-prescription drugs, or dietary supplements you use. Also tell them if you smoke, drink  alcohol, or use illegal drugs. Some items may interact with your medicine. What should I watch for while using this medication? Your condition will be monitored carefully while you are receiving this medication. Visit your care team for regular checks on your progress. You may need blood work while you are taking this medication. What side effects may I notice from receiving this medication? Side effects that you should report to your care team as soon as possible: Allergic reactions--skin rash, itching, hives, swelling of the face, lips, tongue, or throat Low blood pressure--dizziness, feeling faint or lightheaded, blurry vision Shortness of breath Side effects that usually do not require medical attention (report to your care team if they continue or are bothersome): Flushing Headache Joint pain Muscle pain Nausea Pain, redness, or irritation at injection site This list may not describe all possible side effects. Call your doctor for medical advice about side effects. You may report side effects to FDA at 1-800-FDA-1088. Where should I keep my medication? This medication is given in a hospital or clinic and will not be stored at home. NOTE: This sheet is a summary. It may not cover all possible information. If you have questions about this medicine, talk to your doctor, pharmacist, or health care provider.  2023 Elsevier/Gold Standard (2021-01-11 00:00:00)  

## 2022-11-13 ENCOUNTER — Other Ambulatory Visit: Payer: Self-pay | Admitting: *Deleted

## 2022-11-13 DIAGNOSIS — D500 Iron deficiency anemia secondary to blood loss (chronic): Secondary | ICD-10-CM

## 2022-11-13 DIAGNOSIS — D508 Other iron deficiency anemias: Secondary | ICD-10-CM

## 2022-11-13 DIAGNOSIS — D631 Anemia in chronic kidney disease: Secondary | ICD-10-CM

## 2022-11-13 DIAGNOSIS — K5100 Ulcerative (chronic) pancolitis without complications: Secondary | ICD-10-CM

## 2022-11-14 ENCOUNTER — Inpatient Hospital Stay: Payer: Medicare Other

## 2022-11-14 DIAGNOSIS — D631 Anemia in chronic kidney disease: Secondary | ICD-10-CM

## 2022-11-14 DIAGNOSIS — D508 Other iron deficiency anemias: Secondary | ICD-10-CM

## 2022-11-14 DIAGNOSIS — D500 Iron deficiency anemia secondary to blood loss (chronic): Secondary | ICD-10-CM

## 2022-11-14 DIAGNOSIS — K5100 Ulcerative (chronic) pancolitis without complications: Secondary | ICD-10-CM

## 2022-11-14 DIAGNOSIS — C50912 Malignant neoplasm of unspecified site of left female breast: Secondary | ICD-10-CM | POA: Diagnosis not present

## 2022-11-14 LAB — CMP (CANCER CENTER ONLY)
ALT: 20 U/L (ref 0–44)
AST: 22 U/L (ref 15–41)
Albumin: 4.7 g/dL (ref 3.5–5.0)
Alkaline Phosphatase: 111 U/L (ref 38–126)
Anion gap: 9 (ref 5–15)
BUN: 29 mg/dL — ABNORMAL HIGH (ref 6–20)
CO2: 30 mmol/L (ref 22–32)
Calcium: 11.1 mg/dL — ABNORMAL HIGH (ref 8.9–10.3)
Chloride: 102 mmol/L (ref 98–111)
Creatinine: 0.95 mg/dL (ref 0.44–1.00)
GFR, Estimated: >60 mL/min (ref >60)
Glucose, Bld: 152 mg/dL — ABNORMAL HIGH (ref 70–99)
Potassium: 4.8 mmol/L (ref 3.5–5.1)
Sodium: 141 mmol/L (ref 135–145)
Total Bilirubin: 0.7 mg/dL (ref 0.3–1.2)
Total Protein: 8.3 g/dL — ABNORMAL HIGH (ref 6.5–8.1)

## 2022-11-14 LAB — CBC WITH DIFFERENTIAL (CANCER CENTER ONLY)
Abs Immature Granulocytes: 0.01 10*3/uL (ref 0.00–0.07)
Basophils Absolute: 0.1 10*3/uL (ref 0.0–0.1)
Basophils Relative: 1 %
Eosinophils Absolute: 0.5 10*3/uL (ref 0.0–0.5)
Eosinophils Relative: 8 %
HCT: 35.3 % — ABNORMAL LOW (ref 36.0–46.0)
Hemoglobin: 11.1 g/dL — ABNORMAL LOW (ref 12.0–15.0)
Immature Granulocytes: 0 %
Lymphocytes Relative: 19 %
Lymphs Abs: 1.3 10*3/uL (ref 0.7–4.0)
MCH: 26.9 pg (ref 26.0–34.0)
MCHC: 31.4 g/dL (ref 30.0–36.0)
MCV: 85.7 fL (ref 80.0–100.0)
Monocytes Absolute: 0.5 10*3/uL (ref 0.1–1.0)
Monocytes Relative: 7 %
Neutro Abs: 4.6 10*3/uL (ref 1.7–7.7)
Neutrophils Relative %: 65 %
Platelet Count: 237 10*3/uL (ref 150–400)
RBC: 4.12 MIL/uL (ref 3.87–5.11)
RDW: 14.6 % (ref 11.5–15.5)
WBC Count: 7.1 10*3/uL (ref 4.0–10.5)
nRBC: 0 % (ref 0.0–0.2)

## 2022-11-14 NOTE — Progress Notes (Signed)
Patient came in today for Aranesp injection. HGB is 11.1 today. No injection needed today per provider. Patient verbalized understanding.

## 2022-12-01 ENCOUNTER — Other Ambulatory Visit: Payer: Self-pay | Admitting: Hematology & Oncology

## 2022-12-03 ENCOUNTER — Encounter: Payer: Self-pay | Admitting: Hematology & Oncology

## 2022-12-05 ENCOUNTER — Other Ambulatory Visit: Payer: Medicare Other

## 2022-12-05 ENCOUNTER — Ambulatory Visit: Payer: Medicare Other

## 2022-12-18 ENCOUNTER — Other Ambulatory Visit: Payer: Self-pay | Admitting: Hematology & Oncology

## 2022-12-26 ENCOUNTER — Encounter: Payer: Self-pay | Admitting: Hematology & Oncology

## 2022-12-26 ENCOUNTER — Inpatient Hospital Stay: Payer: Medicare Other

## 2022-12-26 ENCOUNTER — Inpatient Hospital Stay: Payer: Medicare Other | Attending: Hematology & Oncology

## 2022-12-26 ENCOUNTER — Inpatient Hospital Stay: Payer: Medicare Other | Admitting: Hematology & Oncology

## 2022-12-26 VITALS — BP 143/76 | HR 71 | Temp 97.8°F | Resp 20 | Ht 62.0 in | Wt 178.1 lb

## 2022-12-26 DIAGNOSIS — C50912 Malignant neoplasm of unspecified site of left female breast: Secondary | ICD-10-CM | POA: Diagnosis present

## 2022-12-26 DIAGNOSIS — Z170 Estrogen receptor positive status [ER+]: Secondary | ICD-10-CM | POA: Diagnosis not present

## 2022-12-26 DIAGNOSIS — N182 Chronic kidney disease, stage 2 (mild): Secondary | ICD-10-CM | POA: Diagnosis present

## 2022-12-26 DIAGNOSIS — D631 Anemia in chronic kidney disease: Secondary | ICD-10-CM

## 2022-12-26 DIAGNOSIS — K9089 Other intestinal malabsorption: Secondary | ICD-10-CM | POA: Diagnosis not present

## 2022-12-26 DIAGNOSIS — K5100 Ulcerative (chronic) pancolitis without complications: Secondary | ICD-10-CM

## 2022-12-26 DIAGNOSIS — K2960 Other gastritis without bleeding: Secondary | ICD-10-CM

## 2022-12-26 DIAGNOSIS — K51013 Ulcerative (chronic) pancolitis with fistula: Secondary | ICD-10-CM | POA: Diagnosis not present

## 2022-12-26 DIAGNOSIS — Z79811 Long term (current) use of aromatase inhibitors: Secondary | ICD-10-CM | POA: Insufficient documentation

## 2022-12-26 LAB — CBC WITH DIFFERENTIAL (CANCER CENTER ONLY)
Abs Immature Granulocytes: 0.04 10*3/uL (ref 0.00–0.07)
Basophils Absolute: 0 10*3/uL (ref 0.0–0.1)
Basophils Relative: 1 %
Eosinophils Absolute: 0.4 10*3/uL (ref 0.0–0.5)
Eosinophils Relative: 7 %
HCT: 32.5 % — ABNORMAL LOW (ref 36.0–46.0)
Hemoglobin: 10.2 g/dL — ABNORMAL LOW (ref 12.0–15.0)
Immature Granulocytes: 1 %
Lymphocytes Relative: 18 %
Lymphs Abs: 1.1 10*3/uL (ref 0.7–4.0)
MCH: 27.2 pg (ref 26.0–34.0)
MCHC: 31.4 g/dL (ref 30.0–36.0)
MCV: 86.7 fL (ref 80.0–100.0)
Monocytes Absolute: 0.3 10*3/uL (ref 0.1–1.0)
Monocytes Relative: 5 %
Neutro Abs: 4 10*3/uL (ref 1.7–7.7)
Neutrophils Relative %: 68 %
Platelet Count: 191 10*3/uL (ref 150–400)
RBC: 3.75 MIL/uL — ABNORMAL LOW (ref 3.87–5.11)
RDW: 14.6 % (ref 11.5–15.5)
WBC Count: 5.9 10*3/uL (ref 4.0–10.5)
nRBC: 0 % (ref 0.0–0.2)

## 2022-12-26 LAB — CMP (CANCER CENTER ONLY)
ALT: 23 U/L (ref 0–44)
AST: 26 U/L (ref 15–41)
Albumin: 4.1 g/dL (ref 3.5–5.0)
Alkaline Phosphatase: 109 U/L (ref 38–126)
Anion gap: 10 (ref 5–15)
BUN: 25 mg/dL — ABNORMAL HIGH (ref 6–20)
CO2: 28 mmol/L (ref 22–32)
Calcium: 9.7 mg/dL (ref 8.9–10.3)
Chloride: 103 mmol/L (ref 98–111)
Creatinine: 0.96 mg/dL (ref 0.44–1.00)
GFR, Estimated: >60 mL/min (ref >60)
Glucose, Bld: 249 mg/dL — ABNORMAL HIGH (ref 70–99)
Potassium: 4.7 mmol/L (ref 3.5–5.1)
Sodium: 141 mmol/L (ref 135–145)
Total Bilirubin: 0.6 mg/dL (ref 0.3–1.2)
Total Protein: 7.4 g/dL (ref 6.5–8.1)

## 2022-12-26 LAB — IRON AND IRON BINDING CAPACITY (CC-WL,HP ONLY)
Iron: 69 ug/dL (ref 28–170)
Saturation Ratios: 13 % (ref 10.4–31.8)
TIBC: 519 ug/dL — ABNORMAL HIGH (ref 250–450)
UIBC: 450 ug/dL — ABNORMAL HIGH (ref 148–442)

## 2022-12-26 LAB — RETICULOCYTES
Immature Retic Fract: 24.5 % — ABNORMAL HIGH (ref 2.3–15.9)
RBC.: 3.77 MIL/uL — ABNORMAL LOW (ref 3.87–5.11)
Retic Count, Absolute: 73.1 10*3/uL (ref 19.0–186.0)
Retic Ct Pct: 1.9 % (ref 0.4–3.1)

## 2022-12-26 LAB — FERRITIN: Ferritin: 32 ng/mL (ref 11–307)

## 2022-12-26 NOTE — Progress Notes (Signed)
Hematology and Oncology Follow Up Visit  Rhonda Gutierrez [id number] 07/28/1961 62 y.o. 12/26/2022   Principle Diagnosis:  Iron deficiency anemia secondary to malabsorption  Anemia of chronic kidney failure stage 2 Pernicious anemia Stage 1 (T1bN0M0) infiltrating ductal carcinoma of the left breast-ER positive/PR positive/HER-2 negative --Oncotype score 13 --status post lobectomy on 09/27/2019  Current Therapy:   IV iron as indicated -Ferrlecit given on 11/07/2022    Aranesp 300 mg sq q 3-4 weeks for Hgb < 10 Vitamin B12 1 mg IM monthly-given by patient at home Arimidex 1 mg p.o. daily   Interim History:  Rhonda Gutierrez is here today for follow-up.  She is doing quite well.  The big news is that she will be moving.  She will be moving to [place].  He has, I mean the country of [place].  She and her husband are in the process of buying a house.  She is showing pictures of this.  It looks beautiful.  I am sure they will have many heavy years of retirement in [place].  She is feeling okay.  She got IV iron back in March.  At that time, her the saturation was only 10%.  She has had no problems with fever.  There is no cough or shortness of breath.  She has had no change in bowel or bladder habits.  She does have ulcerative colitis.  She has had no flareups of this.  She has had no rashes.  There is been no leg swelling.  She has had no problems with her diabetes.  She tries to watch this carefully.  This morning, her blood sugars are quite high at 249.  Overall, I would say that her performance status is probably ECOG 1.    Medications:  Allergies as of 12/26/2022       Reactions   Aspartame Other (See Comments)   shaking   Clarithromycin Rash   Codeine Nausea And Vomiting   Mesalamine Other (See Comments)   "Sees things"   Rofecoxib Hives, Other (See Comments)   Hallucinations   Sulfasalazine Rash, Other (See Comments)   fever   Fish Oil Cough   Losartan Cough   Lyumjev [insulin Lispro]  Rash   Sulfa Antibiotics Rash, Other (See Comments)   fever   Sulfonamide Derivatives Rash, Other (See Comments)   fever        Medication List        Accurate as of December 26, 2022  1:27 PM. If you have any questions, ask your nurse or doctor.          STOP taking these medications    fluconazole 100 MG tablet Commonly known as: DIFLUCAN Stopped by: Josph Macho, MD   nitroGLYCERIN 0.4 mg/hr patch Commonly known as: NITRODUR - Dosed in mg/24 hr Stopped by: Josph Macho, MD   nystatin powder Commonly known as: MYCOSTATIN/NYSTOP Stopped by: Josph Macho, MD   omeprazole 40 MG capsule Commonly known as: PRILOSEC Stopped by: Josph Macho, MD   ondansetron 8 MG tablet Commonly known as: ZOFRAN Stopped by: Josph Macho, MD   tretinoin 0.05 % cream Commonly known as: RETIN-A Stopped by: Josph Macho, MD       TAKE these medications    alendronate 70 MG tablet Commonly known as: FOSAMAX Take 70 mg by mouth once a week. Take with a full glass of water on an empty stomach.   ALPRAZolam 0.25 MG tablet Commonly known as: XANAX Take  0.25 mg by mouth 3 (three) times daily as needed.   anastrozole 1 MG tablet Commonly known as: ARIMIDEX TAKE 1 TABLET BY MOUTH IN THE MORNING   aspirin 81 MG tablet Take 81 mg by mouth daily.   atorvastatin 80 MG tablet Commonly known as: LIPITOR Take 80 mg by mouth daily.   Baqsimi Two Pack 3 MG/DOSE Powd Generic drug: Glucagon Place into the nose.   calcium carbonate 500 MG chewable tablet Commonly known as: TUMS - dosed in mg elemental calcium Chew 3 tablets by mouth daily as needed.   calcium-vitamin D 500-200 MG-UNIT tablet Commonly known as: OSCAL WITH D Take 1 tablet by mouth 2 (two) times daily.   cetirizine 10 MG tablet Commonly known as: ZYRTEC Take 10 mg by mouth daily as needed.   ciprofloxacin 500 MG tablet Commonly known as: CIPRO Take 500 mg by mouth as needed (For J pouch  infection).   cyanocobalamin 1000 MCG/ML injection Commonly known as: VITAMIN B12 Inject into the muscle every 30 (thirty) days.   desonide 0.05 % cream Commonly known as: DESOWEN Apply topically 2 (two) times daily.   Dexcom G6 Receiver Devi by Does not apply route.   Dexcom G6 Sensor Misc by Does not apply route.   escitalopram 20 MG tablet Commonly known as: LEXAPRO Take 20 mg by mouth daily.   famotidine 20 MG tablet Commonly known as: PEPCID Take 20 mg by mouth 2 (two) times daily.   fenofibrate 160 MG tablet Take 160 mg by mouth daily.   fluticasone 50 MCG/ACT nasal spray Commonly known as: FLONASE Place into both nostrils daily.   folic acid 1 MG tablet Commonly known as: FOLVITE Take 1 mg by mouth daily.   gabapentin 600 MG tablet Commonly known as: NEURONTIN Take 600 mg by mouth 3 (three) times daily.   Hyoscyamine Sulfate SL 0.125 MG Subl Place 0.125 mg under the tongue every 6 (six) hours as needed.   insulin lispro 100 UNIT/ML KiwkPen Commonly known as: HumaLOG KwikPen Use as directed if pump does not work What changed: Another medication with the same name was changed. Make sure you understand how and when to take each.   HumaLOG 100 UNIT/ML injection Generic drug: insulin lispro USE IN INSULIN PUMP FOR A TOTAL OF 230 UNITS PER DAY What changed: See the new instructions.   loperamide 2 MG capsule Commonly known as: IMODIUM Take 6 mg by mouth 2 (two) times daily.   Magnesium Oxide 400 MG Caps daily.   metroNIDAZOLE 500 MG tablet Commonly known as: FLAGYL Take 500 mg by mouth as needed.   multivitamin tablet Take 1 tablet by mouth daily.   pantoprazole 40 MG tablet Commonly known as: PROTONIX Take by mouth.   pantoprazole 40 MG tablet Commonly known as: PROTONIX Take 1 tablet by mouth 2 (two) times daily.   ramipril 2.5 MG capsule Commonly known as: ALTACE Take 2.5 mg by mouth 2 (two) times daily. Further refills through PCP.    simethicone 125 MG chewable tablet Commonly known as: MYLICON Chew 125 mg by mouth as needed for flatulence.   Vitamin D3 25 MCG (1000 UT) Caps Take 1,000 Units by mouth daily.   Zenpep 10000-32000 units Cpep Generic drug: Pancrelipase (Lip-Prot-Amyl) Take by mouth. Takes 2 with meals and 1 with snack.        Allergies:  Allergies  Allergen Reactions   Aspartame Other (See Comments)    shaking   Clarithromycin Rash   Codeine Nausea  And Vomiting   Mesalamine Other (See Comments)    "Sees things"   Rofecoxib Hives and Other (See Comments)    Hallucinations   Sulfasalazine Rash and Other (See Comments)    fever   Fish Oil Cough   Losartan Cough   Lyumjev [Insulin Lispro] Rash   Sulfa Antibiotics Rash and Other (See Comments)    fever    Sulfonamide Derivatives Rash and Other (See Comments)    fever    Past Medical History, Surgical history, Social history, and Family History were reviewed and updated.  Review of Systems: Review of Systems  Constitutional:  Positive for malaise/fatigue.  HENT: Negative.    Eyes: Negative.   Respiratory: Negative.    Cardiovascular: Negative.   Gastrointestinal: Negative.   Genitourinary: Negative.   Musculoskeletal: Negative.   Skin: Negative.   Neurological: Negative.   Endo/Heme/Allergies: Negative.   Psychiatric/Behavioral: Negative.       Physical Exam:  height is 5\' 2"  (1.575 m) and weight is 178 lb 1.9 oz (80.8 kg). Her oral temperature is 97.8 F (36.6 C). Her blood pressure is 143/76 (abnormal) and her pulse is 71. Her respiration is 20.   Wt Readings from Last 3 Encounters:  12/26/22 178 lb 1.9 oz (80.8 kg)  10/31/22 178 lb (80.7 kg)  08/06/22 173 lb (78.5 kg)    Physical Exam Vitals reviewed.  Constitutional:      Comments: Her breast exam shows right breast no masses, edema or erythema.  There is no right axillary adenopathy.  Left breast shows the lumpectomy scar at about the 2 o'clock position.  This is  well-healed.  There is some slight contraction of the left breast from radiation.  She does have some erythema under the left breast.  She has a well-healed lumpectomy scar.  There is no fullness in the left axilla.  HENT:     Head: Normocephalic and atraumatic.  Eyes:     Pupils: Pupils are equal, round, and reactive to light.  Cardiovascular:     Rate and Rhythm: Normal rate and regular rhythm.     Heart sounds: Normal heart sounds.  Pulmonary:     Effort: Pulmonary effort is normal.     Breath sounds: Normal breath sounds.  Abdominal:     General: Bowel sounds are normal.     Palpations: Abdomen is soft.  Musculoskeletal:        General: No tenderness or deformity. Normal range of motion.     Cervical back: Normal range of motion.  Lymphadenopathy:     Cervical: No cervical adenopathy.  Skin:    General: Skin is warm and dry.     Findings: No erythema or rash.  Neurological:     Mental Status: She is alert and oriented to person, place, and time.  Psychiatric:        Behavior: Behavior normal.        Thought Content: Thought content normal.        Judgment: Judgment normal.      Lab Results  Component Value Date   WBC 5.9 12/26/2022   HGB 10.2 (L) 12/26/2022   HCT 32.5 (L) 12/26/2022   MCV 86.7 12/26/2022   PLT 191 12/26/2022   Lab Results  Component Value Date   FERRITIN 48 10/31/2022   IRON 51 10/31/2022   TIBC 494 (H) 10/31/2022   UIBC 443 (H) 10/31/2022   IRONPCTSAT 10 (L) 10/31/2022   Lab Results  Component Value Date   RETICCTPCT  1.9 12/26/2022   RBC 3.77 (L) 12/26/2022   RBC 3.75 (L) 12/26/2022   No results found for: "KPAFRELGTCHN", "LAMBDASER", "KAPLAMBRATIO" No results found for: "IGGSERUM", "IGA", "IGMSERUM" No results found for: "TOTALPROTELP", "ALBUMINELP", "A1GS", "A2GS", "BETS", "BETA2SER", "GAMS", "MSPIKE", "SPEI"   Chemistry      Component Value Date/Time   NA 141 12/26/2022 1158   K 4.7 12/26/2022 1158   CL 103 12/26/2022 1158   CO2  28 12/26/2022 1158   BUN 25 (H) 12/26/2022 1158   CREATININE 0.96 12/26/2022 1158      Component Value Date/Time   CALCIUM 9.7 12/26/2022 1158   CALCIUM 9.7 04/08/2011 1053   ALKPHOS 109 12/26/2022 1158   AST 26 12/26/2022 1158   ALT 23 12/26/2022 1158   BILITOT 0.6 12/26/2022 1158      Impression and Plan: Ms. 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  Her recent problem is the early stage-stage I-infiltrating ductal carcinoma of the left breast.  This was found incidentally.  She underwent a lumpectomy and had radiation therapy.  All this was completed in April 2021.  She is on Arimidex.  For right now, we are mostly focused on her anemia.  She would not qualify for Aranesp today.  We will see what her iron studies show.  I just hate the fact that we will be losing her to [place].  However, I know this is what she and her husband want to do.  She will be coming back every now and then to visit her daughters.  I think she will be leaving either in late July or early August.  We will plan to get her back to see [place] in June.  We will see what her iron studies look like today.   Josph Macho, MD 4/26/20241:27 PM

## 2022-12-29 ENCOUNTER — Encounter: Payer: Self-pay | Admitting: *Deleted

## 2022-12-30 ENCOUNTER — Other Ambulatory Visit: Payer: Self-pay | Admitting: Hematology & Oncology

## 2023-01-16 ENCOUNTER — Encounter: Payer: Self-pay | Admitting: Hematology & Oncology

## 2023-01-29 ENCOUNTER — Ambulatory Visit: Payer: Medicare Other | Admitting: Rheumatology

## 2023-02-04 ENCOUNTER — Encounter: Payer: Self-pay | Admitting: Hematology & Oncology

## 2023-02-06 ENCOUNTER — Encounter: Payer: Self-pay | Admitting: Family

## 2023-02-06 ENCOUNTER — Other Ambulatory Visit: Payer: Self-pay | Admitting: Hematology & Oncology

## 2023-02-07 ENCOUNTER — Encounter: Payer: Self-pay | Admitting: Family

## 2023-02-12 ENCOUNTER — Inpatient Hospital Stay: Payer: Medicare Other

## 2023-02-12 ENCOUNTER — Inpatient Hospital Stay: Payer: Medicare Other | Attending: Hematology & Oncology

## 2023-02-12 ENCOUNTER — Encounter: Payer: Self-pay | Admitting: Hematology & Oncology

## 2023-02-12 ENCOUNTER — Inpatient Hospital Stay: Payer: Medicare Other | Admitting: Hematology & Oncology

## 2023-02-12 VITALS — BP 139/67 | HR 71 | Temp 98.2°F | Resp 20 | Ht 62.0 in | Wt 173.1 lb

## 2023-02-12 DIAGNOSIS — Z170 Estrogen receptor positive status [ER+]: Secondary | ICD-10-CM | POA: Insufficient documentation

## 2023-02-12 DIAGNOSIS — C9012 Plasma cell leukemia in relapse: Secondary | ICD-10-CM | POA: Diagnosis not present

## 2023-02-12 DIAGNOSIS — D631 Anemia in chronic kidney disease: Secondary | ICD-10-CM | POA: Diagnosis present

## 2023-02-12 DIAGNOSIS — N182 Chronic kidney disease, stage 2 (mild): Secondary | ICD-10-CM | POA: Diagnosis present

## 2023-02-12 DIAGNOSIS — Z79811 Long term (current) use of aromatase inhibitors: Secondary | ICD-10-CM | POA: Diagnosis not present

## 2023-02-12 DIAGNOSIS — K909 Intestinal malabsorption, unspecified: Secondary | ICD-10-CM

## 2023-02-12 DIAGNOSIS — E538 Deficiency of other specified B group vitamins: Secondary | ICD-10-CM | POA: Diagnosis not present

## 2023-02-12 DIAGNOSIS — K51013 Ulcerative (chronic) pancolitis with fistula: Secondary | ICD-10-CM

## 2023-02-12 DIAGNOSIS — E611 Iron deficiency: Secondary | ICD-10-CM | POA: Diagnosis not present

## 2023-02-12 DIAGNOSIS — C50912 Malignant neoplasm of unspecified site of left female breast: Secondary | ICD-10-CM | POA: Insufficient documentation

## 2023-02-12 DIAGNOSIS — D508 Other iron deficiency anemias: Secondary | ICD-10-CM

## 2023-02-12 LAB — RETICULOCYTES
Immature Retic Fract: 21.1 % — ABNORMAL HIGH (ref 2.3–15.9)
RBC.: 3.6 MIL/uL — ABNORMAL LOW (ref 3.87–5.11)
Retic Count, Absolute: 67.7 10*3/uL (ref 19.0–186.0)
Retic Ct Pct: 1.9 % (ref 0.4–3.1)

## 2023-02-12 LAB — CBC WITH DIFFERENTIAL (CANCER CENTER ONLY)
Abs Immature Granulocytes: 0.01 10*3/uL (ref 0.00–0.07)
Basophils Absolute: 0.1 10*3/uL (ref 0.0–0.1)
Basophils Relative: 1 %
Eosinophils Absolute: 0.3 10*3/uL (ref 0.0–0.5)
Eosinophils Relative: 6 %
HCT: 30.9 % — ABNORMAL LOW (ref 36.0–46.0)
Hemoglobin: 9.5 g/dL — ABNORMAL LOW (ref 12.0–15.0)
Immature Granulocytes: 0 %
Lymphocytes Relative: 22 %
Lymphs Abs: 1.1 10*3/uL (ref 0.7–4.0)
MCH: 26.5 pg (ref 26.0–34.0)
MCHC: 30.7 g/dL (ref 30.0–36.0)
MCV: 86.1 fL (ref 80.0–100.0)
Monocytes Absolute: 0.3 10*3/uL (ref 0.1–1.0)
Monocytes Relative: 6 %
Neutro Abs: 3.1 10*3/uL (ref 1.7–7.7)
Neutrophils Relative %: 65 %
Platelet Count: 191 10*3/uL (ref 150–400)
RBC: 3.59 MIL/uL — ABNORMAL LOW (ref 3.87–5.11)
RDW: 13.6 % (ref 11.5–15.5)
WBC Count: 4.8 10*3/uL (ref 4.0–10.5)
nRBC: 0 % (ref 0.0–0.2)

## 2023-02-12 LAB — CMP (CANCER CENTER ONLY)
ALT: 19 U/L (ref 0–44)
AST: 28 U/L (ref 15–41)
Albumin: 4.3 g/dL (ref 3.5–5.0)
Alkaline Phosphatase: 87 U/L (ref 38–126)
Anion gap: 8 (ref 5–15)
BUN: 15 mg/dL (ref 6–20)
CO2: 28 mmol/L (ref 22–32)
Calcium: 10.1 mg/dL (ref 8.9–10.3)
Chloride: 105 mmol/L (ref 98–111)
Creatinine: 0.96 mg/dL (ref 0.44–1.00)
GFR, Estimated: >60 mL/min (ref >60)
Glucose, Bld: 173 mg/dL — ABNORMAL HIGH (ref 70–99)
Potassium: 4.4 mmol/L (ref 3.5–5.1)
Sodium: 141 mmol/L (ref 135–145)
Total Bilirubin: 0.6 mg/dL (ref 0.3–1.2)
Total Protein: 7.3 g/dL (ref 6.5–8.1)

## 2023-02-12 LAB — FERRITIN: Ferritin: 11 ng/mL (ref 11–307)

## 2023-02-12 LAB — IRON AND IRON BINDING CAPACITY (CC-WL,HP ONLY)
Iron: 32 ug/dL (ref 28–170)
Saturation Ratios: 6 % — ABNORMAL LOW (ref 10.4–31.8)
TIBC: 560 ug/dL — ABNORMAL HIGH (ref 250–450)
UIBC: 528 ug/dL — ABNORMAL HIGH (ref 148–442)

## 2023-02-12 LAB — SAVE SMEAR(SSMR), FOR PROVIDER SLIDE REVIEW

## 2023-02-12 MED ORDER — DARBEPOETIN ALFA 300 MCG/0.6ML IJ SOSY
300.0000 ug | PREFILLED_SYRINGE | Freq: Once | INTRAMUSCULAR | Status: AC
  Administered 2023-02-12: 300 ug via SUBCUTANEOUS
  Filled 2023-02-12: qty 0.6

## 2023-02-12 NOTE — Progress Notes (Signed)
Hematology and Oncology Follow Up Visit  Rhonda Gutierrez [id number] [birth year] 62 y.o. 02/12/2023   Principle Diagnosis:  Iron deficiency anemia secondary to malabsorption  Anemia of chronic kidney failure stage 2 Pernicious anemia Stage 1 (T1bN0M0) infiltrating ductal carcinoma of the left breast-ER positive/PR positive/HER-2 negative --Oncotype score 13 --status post lobectomy on 09/27/2019  Current Therapy:   IV iron as indicated -Ferrlecit given on 11/07/2022    Aranesp 300 mg sq q 3-4 weeks for Hgb < 10 Vitamin B12 1 mg IM monthly-given by patient at home Arimidex 1 mg p.o. daily   Interim History:  Rhonda Gutierrez is here today for follow-up.  She is not feeling all that well.  She had a biopsy of the left breast today.  This was because of some change in area that had previously been identified as fat necrosis.  Hopefully, this is not can to be a problem for her.  She is on Arimidex which I would think would be appropriate.  She just feels tired.  Her hemoglobin is down to 9.6.  We gave her dose of Aranesp today.  I suspect that she probably will need to have some IV iron.  She has had no change in bowel or bladder habits.  She has had no nausea or vomiting.  She and her husband are still planning on moving to [place].  So not sure when this will happen.  She has had no headache.  She has had no rashes.  There is been no leg swelling.  Overall, I would say performance status is probably ECOG 1.    Medications:  Allergies as of 02/12/2023       Reactions   Aspartame Other (See Comments)   shaking   Clarithromycin Rash   Codeine Nausea And Vomiting   Mesalamine Other (See Comments)   "Sees things"   Rofecoxib Hives, Other (See Comments)   Hallucinations   Sulfasalazine Rash, Other (See Comments)   fever   Fish Oil Cough   Losartan Cough   Lyumjev [insulin Lispro] Rash   Sulfa Antibiotics Rash, Other (See Comments)   fever   Sulfonamide Derivatives Rash, Other (See  Comments)   fever        Medication List        Accurate as of February 12, 2023  1:06 PM. If you have any questions, ask your nurse or doctor.          STOP taking these medications    Hyoscyamine Sulfate SL 0.125 MG Subl Stopped by: Josph Macho, MD   metroNIDAZOLE 500 MG tablet Commonly known as: FLAGYL Stopped by: Josph Macho, MD   Zenpep 10000-32000 units Cpep Generic drug: Pancrelipase (Lip-Prot-Amyl) Stopped by: Josph Macho, MD       TAKE these medications    alendronate 70 MG tablet Commonly known as: FOSAMAX Take 70 mg by mouth once a week. Take with a full glass of water on an empty stomach.   ALPRAZolam 0.25 MG tablet Commonly known as: XANAX Take 0.25 mg by mouth 3 (three) times daily as needed.   anastrozole 1 MG tablet Commonly known as: ARIMIDEX TAKE 1 TABLET BY MOUTH IN THE MORNING   aspirin 81 MG tablet Take 81 mg by mouth daily.   atorvastatin 80 MG tablet Commonly known as: LIPITOR Take 80 mg by mouth daily.   Baqsimi Two Pack 3 MG/DOSE Powd Generic drug: Glucagon Place into the nose.   calcium carbonate 500 MG chewable tablet Commonly  known as: TUMS - dosed in mg elemental calcium Chew 3 tablets by mouth daily as needed.   calcium-vitamin D 500-200 MG-UNIT tablet Commonly known as: OSCAL WITH D Take 1 tablet by mouth 2 (two) times daily.   cetirizine 10 MG tablet Commonly known as: ZYRTEC Take 10 mg by mouth daily as needed.   ciprofloxacin 500 MG tablet Commonly known as: CIPRO Take 500 mg by mouth as needed (For J pouch infection).   clobetasol ointment 0.05 % Commonly known as: TEMOVATE Apply topically 2 (two) times daily.   cyanocobalamin 1000 MCG/ML injection Commonly known as: VITAMIN B12 Inject into the muscle every 30 (thirty) days.   desonide 0.05 % cream Commonly known as: DESOWEN Apply topically 2 (two) times daily.   Dexcom G6 Receiver Devi by Does not apply route.   Dexcom G6 Sensor  Misc by Does not apply route.   escitalopram 20 MG tablet Commonly known as: LEXAPRO Take 20 mg by mouth daily.   famotidine 20 MG tablet Commonly known as: PEPCID Take 20 mg by mouth 2 (two) times daily.   fenofibrate 160 MG tablet Take 160 mg by mouth daily.   fluticasone 50 MCG/ACT nasal spray Commonly known as: FLONASE Place into both nostrils daily.   folic acid 1 MG tablet Commonly known as: FOLVITE Take 1 mg by mouth daily.   gabapentin 600 MG tablet Commonly known as: NEURONTIN Take 600 mg by mouth 3 (three) times daily.   insulin lispro 100 UNIT/ML KiwkPen Commonly known as: HumaLOG KwikPen Use as directed if pump does not work What changed: Another medication with the same name was changed. Make sure you understand how and when to take each.   HumaLOG 100 UNIT/ML injection Generic drug: insulin lispro USE IN INSULIN PUMP FOR A TOTAL OF 230 UNITS PER DAY What changed: See the new instructions.   loperamide 2 MG capsule Commonly known as: IMODIUM Take 6 mg by mouth 2 (two) times daily.   Magnesium Oxide 400 MG Caps daily.   multivitamin tablet Take 1 tablet by mouth daily.   pantoprazole 40 MG tablet Commonly known as: PROTONIX Take 1 tablet by mouth 2 (two) times daily.   ramipril 2.5 MG capsule Commonly known as: ALTACE Take 2.5 mg by mouth 2 (two) times daily. Further refills through PCP.   simethicone 125 MG chewable tablet Commonly known as: MYLICON Chew 125 mg by mouth as needed for flatulence.   Vitamin D3 25 MCG (1000 UT) Caps Take 1,000 Units by mouth daily.        Allergies:  Allergies  Allergen Reactions   Aspartame Other (See Comments)    shaking   Clarithromycin Rash   Codeine Nausea And Vomiting   Mesalamine Other (See Comments)    "Sees things"   Rofecoxib Hives and Other (See Comments)    Hallucinations   Sulfasalazine Rash and Other (See Comments)    fever   Fish Oil Cough   Losartan Cough   Lyumjev [Insulin  Lispro] Rash   Sulfa Antibiotics Rash and Other (See Comments)    fever    Sulfonamide Derivatives Rash and Other (See Comments)    fever    Past Medical History, Surgical history, Social history, and Family History were reviewed and updated.  Review of Systems: Review of Systems  Constitutional:  Positive for malaise/fatigue.  HENT: Negative.    Eyes: Negative.   Respiratory: Negative.    Cardiovascular: Negative.   Gastrointestinal: Negative.   Genitourinary: Negative.  Musculoskeletal: Negative.   Skin: Negative.   Neurological: Negative.   Endo/Heme/Allergies: Negative.   Psychiatric/Behavioral: Negative.       Physical Exam:  height is 5\' 2"  (1.575 m) and weight is 173 lb 1.3 oz (78.5 kg). Her oral temperature is 98.2 F (36.8 C). Her blood pressure is 139/67 and her pulse is 71. Her respiration is 20 and oxygen saturation is 97%.   Wt Readings from Last 3 Encounters:  02/12/23 173 lb 1.3 oz (78.5 kg)  12/26/22 178 lb 1.9 oz (80.8 kg)  10/31/22 178 lb (80.7 kg)    Physical Exam Vitals reviewed.  Constitutional:      Comments: Her breast exam shows right breast no masses, edema or erythema.  There is no right axillary adenopathy.  Left breast shows the lumpectomy scar at about the 2 o'clock position.  This is well-healed.  There is some slight contraction of the left breast from radiation.  She does have some erythema under the left breast.  She has a well-healed lumpectomy scar.  There is no fullness in the left axilla.  HENT:     Head: Normocephalic and atraumatic.  Eyes:     Pupils: Pupils are equal, round, and reactive to light.  Cardiovascular:     Rate and Rhythm: Normal rate and regular rhythm.     Heart sounds: Normal heart sounds.  Pulmonary:     Effort: Pulmonary effort is normal.     Breath sounds: Normal breath sounds.  Abdominal:     General: Bowel sounds are normal.     Palpations: Abdomen is soft.  Musculoskeletal:        General: No  tenderness or deformity. Normal range of motion.     Cervical back: Normal range of motion.  Lymphadenopathy:     Cervical: No cervical adenopathy.  Skin:    General: Skin is warm and dry.     Findings: No erythema or rash.  Neurological:     Mental Status: She is alert and oriented to person, place, and time.  Psychiatric:        Behavior: Behavior normal.        Thought Content: Thought content normal.        Judgment: Judgment normal.      Lab Results  Component Value Date   WBC 4.8 02/12/2023   HGB 9.5 (L) 02/12/2023   HCT 30.9 (L) 02/12/2023   MCV 86.1 02/12/2023   PLT 191 02/12/2023   Lab Results  Component Value Date   FERRITIN 32 12/26/2022   IRON 69 12/26/2022   TIBC 519 (H) 12/26/2022   UIBC 450 (H) 12/26/2022   IRONPCTSAT 13 12/26/2022   Lab Results  Component Value Date   RETICCTPCT 1.9 02/12/2023   RBC 3.60 (L) 02/12/2023   No results found for: "KPAFRELGTCHN", "LAMBDASER", "KAPLAMBRATIO" No results found for: "IGGSERUM", "IGA", "IGMSERUM" No results found for: "TOTALPROTELP", "ALBUMINELP", "A1GS", "A2GS", "BETS", "BETA2SER", "GAMS", "MSPIKE", "SPEI"   Chemistry      Component Value Date/Time   NA 141 02/12/2023 1152   K 4.4 02/12/2023 1152   CL 105 02/12/2023 1152   CO2 28 02/12/2023 1152   BUN 15 02/12/2023 1152   CREATININE 0.96 02/12/2023 1152      Component Value Date/Time   CALCIUM 10.1 02/12/2023 1152   CALCIUM 9.7 04/08/2011 1053   ALKPHOS 87 02/12/2023 1152   AST 28 02/12/2023 1152   ALT 19 02/12/2023 1152   BILITOT 0.6 02/12/2023 1152  Impression and Plan: Ms. 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  Her recent problem is the early stage-stage I-infiltrating ductal carcinoma of the left breast.  This was found incidentally.  She underwent a lumpectomy and had radiation therapy.  All this was completed in April 2021.  She is on Arimidex.  I  cannot imagine that she may have recurrence in the left breast.  However, this is certainly not out of the realm of possibility.  The anemia still bothers me.  She will get her Aranesp.  She likely will need IV iron.  We will have to see what her iron studies show.  For right now, we are mostly focused on her anemia.  She would not qualify for Aranesp today.  We will see what her iron studies show.  I will have to probably get her back a little bit sooner.  I will plan to get her back in probably 6 weeks.   Josph Macho, MD 6/13/20241:06 PM

## 2023-02-12 NOTE — Patient Instructions (Signed)

## 2023-02-13 ENCOUNTER — Encounter: Payer: Self-pay | Admitting: *Deleted

## 2023-02-24 ENCOUNTER — Inpatient Hospital Stay: Payer: Medicare Other

## 2023-02-24 VITALS — BP 120/69 | HR 77 | Temp 98.1°F | Resp 18

## 2023-02-24 DIAGNOSIS — D508 Other iron deficiency anemias: Secondary | ICD-10-CM

## 2023-02-24 DIAGNOSIS — C50912 Malignant neoplasm of unspecified site of left female breast: Secondary | ICD-10-CM | POA: Diagnosis not present

## 2023-02-24 MED ORDER — SODIUM CHLORIDE 0.9 % IV SOLN
125.0000 mg | Freq: Once | INTRAVENOUS | Status: AC
  Administered 2023-02-24: 125 mg via INTRAVENOUS
  Filled 2023-02-24: qty 10

## 2023-02-24 MED ORDER — SODIUM CHLORIDE 0.9 % IV SOLN: Freq: Once | INTRAVENOUS | Status: AC

## 2023-02-24 NOTE — Patient Instructions (Signed)
Sodium Ferric Gluconate Complex Injection What is this medication? SODIUM FERRIC GLUCONATE COMPLEX (SOE dee um FER ik GLOO koe nate KOM pleks) treats low levels of iron (iron deficiency anemia) in people with kidney disease. Iron is a mineral that plays an important role in making red blood cells, which carry oxygen from your lungs to the rest of your body. This medicine may be used for other purposes; ask your health care provider or pharmacist if you have questions. COMMON BRAND NAME(S): Ferrlecit, Nulecit What should I tell my care team before I take this medication? They need to know if you have any of the following conditions: Anemia that is not from iron deficiency High levels of iron in the blood An unusual or allergic reaction to iron, other medications, foods, dyes, or preservatives Pregnant or are trying to become pregnant Breast-feeding How should I use this medication? This medication is injected into a vein. It is given by your care team in a hospital or clinic setting. Talk to your care team about the use of this medication in children. While it may be prescribed for children as young as 6 years for selected conditions, precautions do apply. Overdosage: If you think you have taken too much of this medicine contact a poison control center or emergency room at once. NOTE: This medicine is only for you. Do not share this medicine with others. What if I miss a dose? It is important not to miss your dose. Call your care team if you are unable to keep an appointment. What may interact with this medication? Do not take this medication with any of the following: Deferasirox Deferoxamine Dimercaprol This medication may also interact with the following: Other iron products This list may not describe all possible interactions. Give your health care provider a list of all the medicines, herbs, non-prescription drugs, or dietary supplements you use. Also tell them if you smoke, drink  alcohol, or use illegal drugs. Some items may interact with your medicine. What should I watch for while using this medication? Your condition will be monitored carefully while you are receiving this medication. Visit your care team for regular checks on your progress. You may need blood work while you are taking this medication. What side effects may I notice from receiving this medication? Side effects that you should report to your care team as soon as possible: Allergic reactions--skin rash, itching, hives, swelling of the face, lips, tongue, or throat Low blood pressure--dizziness, feeling faint or lightheaded, blurry vision Shortness of breath Side effects that usually do not require medical attention (report to your care team if they continue or are bothersome): Flushing Headache Joint pain Muscle pain Nausea Pain, redness, or irritation at injection site This list may not describe all possible side effects. Call your doctor for medical advice about side effects. You may report side effects to FDA at 1-800-FDA-1088. Where should I keep my medication? This medication is given in a hospital or clinic and will not be stored at home. NOTE: This sheet is a summary. It may not cover all possible information. If you have questions about this medicine, talk to your doctor, pharmacist, or health care provider.  2024 Elsevier/Gold Standard (2021-01-11 00:00:00)  

## 2023-03-03 ENCOUNTER — Inpatient Hospital Stay: Payer: Medicare Other | Attending: Hematology & Oncology

## 2023-03-03 VITALS — BP 140/71 | HR 67 | Temp 98.1°F | Resp 20

## 2023-03-03 DIAGNOSIS — D508 Other iron deficiency anemias: Secondary | ICD-10-CM

## 2023-03-03 DIAGNOSIS — N182 Chronic kidney disease, stage 2 (mild): Secondary | ICD-10-CM | POA: Diagnosis present

## 2023-03-03 DIAGNOSIS — D631 Anemia in chronic kidney disease: Secondary | ICD-10-CM | POA: Diagnosis present

## 2023-03-03 MED ORDER — SODIUM CHLORIDE 0.9 % IV SOLN: Freq: Once | INTRAVENOUS | Status: AC

## 2023-03-03 MED ORDER — SODIUM CHLORIDE 0.9 % IV SOLN
125.0000 mg | Freq: Once | INTRAVENOUS | Status: AC
  Administered 2023-03-03: 125 mg via INTRAVENOUS
  Filled 2023-03-03: qty 10

## 2023-03-03 NOTE — Patient Instructions (Signed)
Sodium Ferric Gluconate Complex Injection What is this medication? SODIUM FERRIC GLUCONATE COMPLEX (SOE dee um FER ik GLOO koe nate KOM pleks) treats low levels of iron (iron deficiency anemia) in people with kidney disease. Iron is a mineral that plays an important role in making red blood cells, which carry oxygen from your lungs to the rest of your body. This medicine may be used for other purposes; ask your health care provider or pharmacist if you have questions. COMMON BRAND NAME(S): Ferrlecit, Nulecit What should I tell my care team before I take this medication? They need to know if you have any of the following conditions: Anemia that is not from iron deficiency High levels of iron in the blood An unusual or allergic reaction to iron, other medications, foods, dyes, or preservatives Pregnant or are trying to become pregnant Breast-feeding How should I use this medication? This medication is injected into a vein. It is given by your care team in a hospital or clinic setting. Talk to your care team about the use of this medication in children. While it may be prescribed for children as young as 6 years for selected conditions, precautions do apply. Overdosage: If you think you have taken too much of this medicine contact a poison control center or emergency room at once. NOTE: This medicine is only for you. Do not share this medicine with others. What if I miss a dose? It is important not to miss your dose. Call your care team if you are unable to keep an appointment. What may interact with this medication? Do not take this medication with any of the following: Deferasirox Deferoxamine Dimercaprol This medication may also interact with the following: Other iron products This list may not describe all possible interactions. Give your health care provider a list of all the medicines, herbs, non-prescription drugs, or dietary supplements you use. Also tell them if you smoke, drink  alcohol, or use illegal drugs. Some items may interact with your medicine. What should I watch for while using this medication? Your condition will be monitored carefully while you are receiving this medication. Visit your care team for regular checks on your progress. You may need blood work while you are taking this medication. What side effects may I notice from receiving this medication? Side effects that you should report to your care team as soon as possible: Allergic reactions--skin rash, itching, hives, swelling of the face, lips, tongue, or throat Low blood pressure--dizziness, feeling faint or lightheaded, blurry vision Shortness of breath Side effects that usually do not require medical attention (report to your care team if they continue or are bothersome): Flushing Headache Joint pain Muscle pain Nausea Pain, redness, or irritation at injection site This list may not describe all possible side effects. Call your doctor for medical advice about side effects. You may report side effects to FDA at 1-800-FDA-1088. Where should I keep my medication? This medication is given in a hospital or clinic and will not be stored at home. NOTE: This sheet is a summary. It may not cover all possible information. If you have questions about this medicine, talk to your doctor, pharmacist, or health care provider.  2024 Elsevier/Gold Standard (2021-01-11 00:00:00)  

## 2023-03-05 ENCOUNTER — Other Ambulatory Visit: Payer: Self-pay | Admitting: Hematology & Oncology

## 2023-03-11 ENCOUNTER — Inpatient Hospital Stay: Payer: Medicare Other

## 2023-03-11 VITALS — BP 121/60 | HR 81

## 2023-03-11 DIAGNOSIS — D508 Other iron deficiency anemias: Secondary | ICD-10-CM

## 2023-03-11 DIAGNOSIS — N182 Chronic kidney disease, stage 2 (mild): Secondary | ICD-10-CM | POA: Diagnosis not present

## 2023-03-11 MED ORDER — SODIUM CHLORIDE 0.9 % IV SOLN: Freq: Once | INTRAVENOUS | Status: AC

## 2023-03-11 MED ORDER — SODIUM CHLORIDE 0.9 % IV SOLN
125.0000 mg | Freq: Once | INTRAVENOUS | Status: AC
  Administered 2023-03-11: 125 mg via INTRAVENOUS
  Filled 2023-03-11: qty 10

## 2023-03-12 ENCOUNTER — Telehealth: Payer: Self-pay | Admitting: Dietician

## 2023-03-12 NOTE — Telephone Encounter (Signed)
Patient has a [email or web address] email address.  The [email or web address] addresses bounce back when sent from [facility].  This has been addressed with IT but they state that this is a [employer] issue and unable to be solved.  Called patient who was not available.  Left a message with my contact information if she wishes to have information sent to her about the Type [facility] Diabetes Support [facility].  She needs to provide an email address from another site.  Oran Rein, RD, LDN, CDCES

## 2023-03-12 NOTE — Telephone Encounter (Signed)
Patient called with an updated email.  Will update her contact on the Type 1 [facility] Support [facility] mailing list.

## 2023-03-18 ENCOUNTER — Other Ambulatory Visit: Payer: Self-pay

## 2023-03-18 DIAGNOSIS — D508 Other iron deficiency anemias: Secondary | ICD-10-CM

## 2023-03-19 ENCOUNTER — Inpatient Hospital Stay: Payer: Medicare Other

## 2023-03-19 DIAGNOSIS — D508 Other iron deficiency anemias: Secondary | ICD-10-CM

## 2023-03-19 DIAGNOSIS — N182 Chronic kidney disease, stage 2 (mild): Secondary | ICD-10-CM | POA: Diagnosis not present

## 2023-03-19 LAB — CBC WITH DIFFERENTIAL (CANCER CENTER ONLY)
Abs Immature Granulocytes: 0.03 10*3/uL (ref 0.00–0.07)
Basophils Absolute: 0.1 10*3/uL (ref 0.0–0.1)
Basophils Relative: 1 %
Eosinophils Absolute: 0.3 10*3/uL (ref 0.0–0.5)
Eosinophils Relative: 5 %
HCT: 34.9 % — ABNORMAL LOW (ref 36.0–46.0)
Hemoglobin: 10.6 g/dL — ABNORMAL LOW (ref 12.0–15.0)
Immature Granulocytes: 1 %
Lymphocytes Relative: 22 %
Lymphs Abs: 1.4 10*3/uL (ref 0.7–4.0)
MCH: 26.2 pg (ref 26.0–34.0)
MCHC: 30.4 g/dL (ref 30.0–36.0)
MCV: 86.2 fL (ref 80.0–100.0)
Monocytes Absolute: 0.4 10*3/uL (ref 0.1–1.0)
Monocytes Relative: 6 %
Neutro Abs: 4.3 10*3/uL (ref 1.7–7.7)
Neutrophils Relative %: 65 %
Platelet Count: 232 10*3/uL (ref 150–400)
RBC: 4.05 MIL/uL (ref 3.87–5.11)
RDW: 15.1 % (ref 11.5–15.5)
WBC Count: 6.6 10*3/uL (ref 4.0–10.5)
nRBC: 0 % (ref 0.0–0.2)

## 2023-03-19 LAB — CMP (CANCER CENTER ONLY)
ALT: 16 U/L (ref 0–44)
AST: 20 U/L (ref 15–41)
Albumin: 4.5 g/dL (ref 3.5–5.0)
Alkaline Phosphatase: 112 U/L (ref 38–126)
Anion gap: 10 (ref 5–15)
BUN: 16 mg/dL (ref 6–20)
CO2: 28 mmol/L (ref 22–32)
Calcium: 10.8 mg/dL — ABNORMAL HIGH (ref 8.9–10.3)
Chloride: 104 mmol/L (ref 98–111)
Creatinine: 0.88 mg/dL (ref 0.44–1.00)
GFR, Estimated: >60 mL/min (ref >60)
Glucose, Bld: 181 mg/dL — ABNORMAL HIGH (ref 70–99)
Potassium: 4.3 mmol/L (ref 3.5–5.1)
Sodium: 142 mmol/L (ref 135–145)
Total Bilirubin: 0.5 mg/dL (ref 0.3–1.2)
Total Protein: 8 g/dL (ref 6.5–8.1)

## 2023-03-19 NOTE — Progress Notes (Signed)
No injection. Parameters not met

## 2023-03-26 ENCOUNTER — Ambulatory Visit: Payer: Medicare Other

## 2023-03-26 ENCOUNTER — Inpatient Hospital Stay: Payer: Medicare Other

## 2023-03-26 ENCOUNTER — Ambulatory Visit: Payer: Medicare Other | Admitting: Hematology & Oncology

## 2023-04-10 ENCOUNTER — Other Ambulatory Visit: Payer: Self-pay | Admitting: Hematology & Oncology

## 2023-04-11 ENCOUNTER — Encounter: Payer: Self-pay | Admitting: Family

## 2023-04-20 ENCOUNTER — Encounter: Payer: Self-pay | Admitting: Hematology & Oncology

## 2023-04-24 ENCOUNTER — Inpatient Hospital Stay: Payer: Medicare Other

## 2023-04-24 ENCOUNTER — Inpatient Hospital Stay: Payer: Medicare Other | Attending: Hematology & Oncology

## 2023-04-24 ENCOUNTER — Other Ambulatory Visit: Payer: Medicare Other

## 2023-04-24 ENCOUNTER — Other Ambulatory Visit: Payer: Self-pay

## 2023-04-24 ENCOUNTER — Ambulatory Visit: Payer: Medicare Other

## 2023-04-24 ENCOUNTER — Inpatient Hospital Stay: Payer: Medicare Other | Admitting: Hematology & Oncology

## 2023-04-24 ENCOUNTER — Encounter: Payer: Self-pay | Admitting: Hematology & Oncology

## 2023-04-24 ENCOUNTER — Ambulatory Visit: Payer: Medicare Other | Admitting: Hematology & Oncology

## 2023-04-24 VITALS — BP 107/64 | HR 66 | Temp 98.3°F | Resp 18 | Ht 62.0 in | Wt 160.0 lb

## 2023-04-24 DIAGNOSIS — K51018 Ulcerative (chronic) pancolitis with other complication: Secondary | ICD-10-CM | POA: Diagnosis not present

## 2023-04-24 DIAGNOSIS — Z79811 Long term (current) use of aromatase inhibitors: Secondary | ICD-10-CM | POA: Insufficient documentation

## 2023-04-24 DIAGNOSIS — Z170 Estrogen receptor positive status [ER+]: Secondary | ICD-10-CM | POA: Insufficient documentation

## 2023-04-24 DIAGNOSIS — C50912 Malignant neoplasm of unspecified site of left female breast: Secondary | ICD-10-CM | POA: Insufficient documentation

## 2023-04-24 DIAGNOSIS — D500 Iron deficiency anemia secondary to blood loss (chronic): Secondary | ICD-10-CM

## 2023-04-24 DIAGNOSIS — K909 Intestinal malabsorption, unspecified: Secondary | ICD-10-CM | POA: Diagnosis not present

## 2023-04-24 DIAGNOSIS — N182 Chronic kidney disease, stage 2 (mild): Secondary | ICD-10-CM | POA: Diagnosis present

## 2023-04-24 DIAGNOSIS — K2970 Gastritis, unspecified, without bleeding: Secondary | ICD-10-CM | POA: Insufficient documentation

## 2023-04-24 DIAGNOSIS — D631 Anemia in chronic kidney disease: Secondary | ICD-10-CM | POA: Insufficient documentation

## 2023-04-24 DIAGNOSIS — C9012 Plasma cell leukemia in relapse: Secondary | ICD-10-CM

## 2023-04-24 DIAGNOSIS — E538 Deficiency of other specified B group vitamins: Secondary | ICD-10-CM | POA: Insufficient documentation

## 2023-04-24 LAB — CBC WITH DIFFERENTIAL (CANCER CENTER ONLY)
Abs Immature Granulocytes: 0.09 10*3/uL — ABNORMAL HIGH (ref 0.00–0.07)
Basophils Absolute: 0.1 10*3/uL (ref 0.0–0.1)
Basophils Relative: 1 %
Eosinophils Absolute: 0.3 10*3/uL (ref 0.0–0.5)
Eosinophils Relative: 6 %
HCT: 36.2 % (ref 36.0–46.0)
Hemoglobin: 10.9 g/dL — ABNORMAL LOW (ref 12.0–15.0)
Immature Granulocytes: 2 %
Lymphocytes Relative: 19 %
Lymphs Abs: 1 10*3/uL (ref 0.7–4.0)
MCH: 25.8 pg — ABNORMAL LOW (ref 26.0–34.0)
MCHC: 30.1 g/dL (ref 30.0–36.0)
MCV: 85.8 fL (ref 80.0–100.0)
Monocytes Absolute: 0.3 10*3/uL (ref 0.1–1.0)
Monocytes Relative: 5 %
Neutro Abs: 3.4 10*3/uL (ref 1.7–7.7)
Neutrophils Relative %: 67 %
Platelet Count: 202 10*3/uL (ref 150–400)
RBC: 4.22 MIL/uL (ref 3.87–5.11)
RDW: 15.2 % (ref 11.5–15.5)
WBC Count: 5.2 10*3/uL (ref 4.0–10.5)
nRBC: 0 % (ref 0.0–0.2)

## 2023-04-24 LAB — CMP (CANCER CENTER ONLY)
ALT: 16 U/L (ref 0–44)
AST: 28 U/L (ref 15–41)
Albumin: 4.6 g/dL (ref 3.5–5.0)
Alkaline Phosphatase: 84 U/L (ref 38–126)
Anion gap: 8 (ref 5–15)
BUN: 12 mg/dL (ref 6–20)
CO2: 29 mmol/L (ref 22–32)
Calcium: 10.4 mg/dL — ABNORMAL HIGH (ref 8.9–10.3)
Chloride: 100 mmol/L (ref 98–111)
Creatinine: 0.91 mg/dL (ref 0.44–1.00)
GFR, Estimated: >60 mL/min (ref >60)
Glucose, Bld: 200 mg/dL — ABNORMAL HIGH (ref 70–99)
Potassium: 4.6 mmol/L (ref 3.5–5.1)
Sodium: 137 mmol/L (ref 135–145)
Total Bilirubin: 0.9 mg/dL (ref 0.3–1.2)
Total Protein: 8 g/dL (ref 6.5–8.1)

## 2023-04-24 LAB — IRON AND IRON BINDING CAPACITY (CC-WL,HP ONLY)
Iron: 63 ug/dL (ref 28–170)
Saturation Ratios: 11 % (ref 10.4–31.8)
TIBC: 559 ug/dL — ABNORMAL HIGH (ref 250–450)
UIBC: 496 ug/dL — ABNORMAL HIGH (ref 148–442)

## 2023-04-24 LAB — RETICULOCYTES
Immature Retic Fract: 14.4 % (ref 2.3–15.9)
RBC.: 4.26 MIL/uL (ref 3.87–5.11)
Retic Count, Absolute: 49.8 10*3/uL (ref 19.0–186.0)
Retic Ct Pct: 1.2 % (ref 0.4–3.1)

## 2023-04-24 LAB — LACTATE DEHYDROGENASE: LDH: 148 U/L (ref 98–192)

## 2023-04-24 LAB — FERRITIN: Ferritin: 15 ng/mL (ref 11–307)

## 2023-04-24 NOTE — Progress Notes (Signed)
Hematology and Oncology Follow Up Visit  Rhonda Gutierrez [id number] [birth year] 62 y.o. 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 daily   Interim History:  Rhonda Gutierrez is here today for follow-up.  She is getting ready to move over to [place].  I think she will be moving over there probably in October or November.  She is seemingly doing pretty well.  She had a breast biopsy back in June.  Thankfully, this just showed fat necrosis.  We did give her IV iron.  This back in July.  At the time, her ferritin was low with iron saturation 6%.  She seems to be managing okay.  Her diabetes is her biggest problem.  She has had no problems with fever.  Thankfully, has been no problems with COVID.  She has had no change in bowel or bladder habits.  She is on Arimidex.  She is doing well with the Arimidex..  She does vitamin B12 at home.    Overall, her performance status is ECOG 1.    Medications:  Allergies as of 04/24/2023       Reactions   Aspartame Other (See Comments)   shaking   Clarithromycin Rash   Codeine Nausea And Vomiting   Mesalamine Other (See Comments)   "Sees things"   Rofecoxib Hives, Other (See Comments)   Hallucinations   Sulfasalazine Rash, Other (See Comments)   fever   Fish Oil Cough   Losartan Cough   Lyumjev [insulin Lispro] Rash   Sulfa Antibiotics Rash, Other (See Comments)   fever   Sulfonamide Derivatives Rash, Other (See Comments)   fever        Medication List        Accurate as of April 24, 2023 12:43 PM. If you have any questions, ask your nurse or doctor.          STOP taking these medications    ciprofloxacin 500 MG tablet Commonly known as: CIPRO Stopped by: Josph Macho   Dexcom G6 Receiver Devi Stopped by: Josph Macho   Dexcom G6 Sensor Misc Stopped by: Josph Macho       TAKE these medications    alendronate 70 MG tablet Commonly known as: FOSAMAX Take 70 mg by mouth once a week. Take with a full glass of water on an empty stomach.   ALPRAZolam 0.25 MG tablet Commonly known as: XANAX Take 0.25 mg by mouth 3 (three) times daily as needed.   anastrozole 1 MG tablet Commonly known as: ARIMIDEX TAKE 1 TABLET BY MOUTH IN THE MORNING   aspirin 81 MG tablet Take 81 mg by mouth daily.   atorvastatin 80 MG tablet Commonly known as: LIPITOR Take 80 mg by mouth daily.   Baqsimi Two Pack 3 MG/DOSE Powd Generic drug: Glucagon Place into the nose.   calcium carbonate 500 MG chewable tablet Commonly known as: TUMS - dosed in mg elemental calcium Chew 3 tablets by mouth daily as needed.   calcium-vitamin D 500-200 MG-UNIT tablet Commonly known as: OSCAL WITH  D Take 1 tablet by mouth 2 (two) times daily.   cetirizine 10 MG tablet Commonly known as: ZYRTEC Take 10 mg by mouth daily as needed.   clobetasol ointment 0.05 % Commonly known as: TEMOVATE Apply topically 2 (two) times daily.   cyanocobalamin 1000 MCG/ML injection Commonly known as: VITAMIN B12 Inject into the muscle every 30 (thirty) days.   desonide 0.05 % cream Commonly known as: DESOWEN Apply topically 2 (two) times daily.   escitalopram 20 MG tablet Commonly known as: LEXAPRO Take 20 mg by mouth daily.   famotidine 20 MG tablet Commonly known as: PEPCID Take 20 mg by mouth 2 (two) times daily.   fenofibrate 160 MG tablet Take 160 mg by mouth daily.   fluticasone 50 MCG/ACT nasal spray Commonly known as: FLONASE Place into both nostrils daily.   folic acid  1 MG tablet Commonly known as: FOLVITE Take 1 mg by mouth daily.   gabapentin 600 MG tablet Commonly known as: NEURONTIN Take 600 mg by mouth 3 (three) times daily.   insulin lispro 100 UNIT/ML KiwkPen Commonly known as: HumaLOG KwikPen Use as directed if pump does not work What changed: Another medication with the same name was changed. Make sure you understand how and when to take each.   HumaLOG 100 UNIT/ML injection Generic drug: insulin lispro USE IN INSULIN PUMP FOR A TOTAL OF 230 UNITS PER DAY What changed: See the new instructions.   loperamide 2 MG capsule Commonly known as: IMODIUM Take 6 mg by mouth 2 (two) times daily.   Magnesium Oxide 400 MG Caps daily.   multivitamin tablet Take 1 tablet by mouth daily.   pantoprazole 40 MG tablet Commonly known as: PROTONIX Take 1 tablet by mouth 2 (two) times daily.   ramipril 2.5 MG capsule Commonly known as: ALTACE Take 2.5 mg by mouth 2 (two) times daily. Further refills through PCP.   simethicone 125 MG chewable tablet Commonly known as: MYLICON Chew 125 mg by mouth as needed for flatulence.   Vitamin D3 25 MCG (1000 UT) Caps Take 1,000 Units by mouth daily.        Allergies:  Allergies  Allergen Reactions   Aspartame Other (See Comments)    shaking   Clarithromycin Rash   Codeine Nausea And Vomiting   Mesalamine Other (See Comments)    "Sees things"   Rofecoxib Hives and Other (See Comments)    Hallucinations   Sulfasalazine Rash and Other (See Comments)    fever   Fish Oil Cough   Losartan Cough   Lyumjev [Insulin Lispro] Rash   Sulfa Antibiotics Rash and Other (See Comments)    fever    Sulfonamide Derivatives Rash and Other (See Comments)    fever    Past Medical History, Surgical history, Social history, and Family History were reviewed and updated.  Review of Systems: Review of Systems  Constitutional:  Positive for malaise/fatigue.  HENT: Negative.    Eyes: Negative.    Respiratory: Negative.    Cardiovascular: Negative.   Gastrointestinal: Negative.   Genitourinary: Negative.   Musculoskeletal: Negative.   Skin: Negative.   Neurological: Negative.   Endo/Heme/Allergies: Negative.   Psychiatric/Behavioral: Negative.       Physical Exam:  height is 5\' 2"  (1.575 m) and weight is 160 lb (72.6 kg). Her oral temperature is 98.3 F (36.8 C). Her blood pressure is 107/64 and her pulse is 66. Her respiration is 18 and oxygen saturation is 100%.   Wt Readings  from Last 3 Encounters:  04/24/23 160 lb (72.6 kg)  02/12/23 173 lb 1.3 oz (78.5 kg)  12/26/22 178 lb 1.9 oz (80.8 kg)    Physical Exam Vitals reviewed.  Constitutional:      Comments: Her breast exam shows right breast no masses, edema or erythema.  There is no right axillary adenopathy.  Left breast shows the lumpectomy scar at about the 2 o'clock position.  This is well-healed.  There is some slight contraction of the left breast from radiation.  She does have some erythema under the left breast.  She has a well-healed lumpectomy scar.  There is no fullness in the left axilla.  HENT:     Head: Normocephalic and atraumatic.  Eyes:     Pupils: Pupils are equal, round, and reactive to light.  Cardiovascular:     Rate and Rhythm: Normal rate and regular rhythm.     Heart sounds: Normal heart sounds.  Pulmonary:     Effort: Pulmonary effort is normal.     Breath sounds: Normal breath sounds.  Abdominal:     General: Bowel sounds are normal.     Palpations: Abdomen is soft.  Musculoskeletal:        General: No tenderness or deformity. Normal range of motion.     Cervical back: Normal range of motion.  Lymphadenopathy:     Cervical: No cervical adenopathy.  Skin:    General: Skin is warm and dry.     Findings: No erythema or rash.  Neurological:     Mental Status: She is alert and oriented to person, place, and time.  Psychiatric:        Behavior: Behavior normal.        Thought Content:  Thought content normal.        Judgment: Judgment normal.      Lab Results  Component Value Date   WBC 5.2 04/24/2023   HGB 10.9 (L) 04/24/2023   HCT 36.2 04/24/2023   MCV 85.8 04/24/2023   PLT 202 04/24/2023   Lab Results  Component Value Date   FERRITIN 11 02/12/2023   IRON 32 02/12/2023   TIBC 560 (H) 02/12/2023   UIBC 528 (H) 02/12/2023   IRONPCTSAT 6 (L) 02/12/2023   Lab Results  Component Value Date   RETICCTPCT 1.2 04/24/2023   RBC 4.26 04/24/2023   No results found for: "KPAFRELGTCHN", "LAMBDASER", "KAPLAMBRATIO" No results found for: "IGGSERUM", "IGA", "IGMSERUM" No results found for: "TOTALPROTELP", "ALBUMINELP", "A1GS", "A2GS", "BETS", "BETA2SER", "GAMS", "MSPIKE", "SPEI"   Chemistry      Component Value Date/Time   NA 137 04/24/2023 1120   K 4.6 04/24/2023 1120   CL 100 04/24/2023 1120   CO2 29 04/24/2023 1120   BUN 12 04/24/2023 1120   CREATININE 0.91 04/24/2023 1120      Component Value Date/Time   CALCIUM 10.4 (H) 04/24/2023 1120   CALCIUM 9.7 04/08/2011 1053   ALKPHOS 84 04/24/2023 1120   AST 28 04/24/2023 1120   ALT 16 04/24/2023 1120   BILITOT 0.9 04/24/2023 1120      Impression and Plan: Rhonda Gutierrez is a very pleasant 62 yo caucasian female with 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  Her recent problem is the early stage-stage I-infiltrating ductal carcinoma of the left breast.  This was found incidentally.  She underwent a lumpectomy and had radiation therapy.  All this was completed in April 2021.  She is on Arimidex.  Thankfully, the biopsy came back  normal.  There is no evidence of recurrent disease.  I am not surprised by this.  She does not need any Aranesp today.  Will see what her iron studies look like.  I really would like to get her back before she goes to [place].  We will plan to get her back in early October.  [place]   Josph Macho,  MD 8/23/202412:43 PM

## 2023-04-27 ENCOUNTER — Encounter: Payer: Self-pay | Admitting: *Deleted

## 2023-04-27 ENCOUNTER — Telehealth: Payer: Self-pay | Admitting: Oncology

## 2023-04-27 NOTE — Telephone Encounter (Signed)
Contacted pt to schedule an appt. Unable to reach via phone, voicemail was left.    Iron infusion Received: Today Lenn Sink I, RN  Flynt, Selinda Michaels, Damaris Please call patient and schedule one dose of IV iron (Ferrlicit). This is a two hour appointment.

## 2023-04-27 NOTE — Telephone Encounter (Signed)
Patient has been scheduled. Aware of appt date and time  

## 2023-04-30 ENCOUNTER — Inpatient Hospital Stay: Payer: Medicare Other

## 2023-04-30 VITALS — BP 98/52 | HR 71 | Temp 98.2°F | Resp 17

## 2023-04-30 DIAGNOSIS — D508 Other iron deficiency anemias: Secondary | ICD-10-CM

## 2023-04-30 DIAGNOSIS — C50912 Malignant neoplasm of unspecified site of left female breast: Secondary | ICD-10-CM | POA: Diagnosis not present

## 2023-04-30 MED ORDER — SODIUM CHLORIDE 0.9 % IV SOLN: Freq: Once | INTRAVENOUS | Status: AC

## 2023-04-30 MED ORDER — SODIUM CHLORIDE 0.9 % IV SOLN
125.0000 mg | Freq: Once | INTRAVENOUS | Status: AC
  Administered 2023-04-30: 125 mg via INTRAVENOUS
  Filled 2023-04-30: qty 10

## 2023-04-30 NOTE — Patient Instructions (Signed)
Sodium Ferric Gluconate Complex Injection What is this medication? SODIUM FERRIC GLUCONATE COMPLEX (SOE dee um FER ik GLOO koe nate KOM pleks) treats low levels of iron (iron deficiency anemia) in people with kidney disease. Iron is a mineral that plays an important role in making red blood cells, which carry oxygen from your lungs to the rest of your body. This medicine may be used for other purposes; ask your health care provider or pharmacist if you have questions. COMMON BRAND NAME(S): Ferrlecit, Nulecit What should I tell my care team before I take this medication? They need to know if you have any of the following conditions: Anemia that is not from iron deficiency High levels of iron in the blood An unusual or allergic reaction to iron, other medications, foods, dyes, or preservatives Pregnant or are trying to become pregnant Breast-feeding How should I use this medication? This medication is injected into a vein. It is given by your care team in a hospital or clinic setting. Talk to your care team about the use of this medication in children. While it may be prescribed for children as young as 6 years for selected conditions, precautions do apply. Overdosage: If you think you have taken too much of this medicine contact a poison control center or emergency room at once. NOTE: This medicine is only for you. Do not share this medicine with others. What if I miss a dose? It is important not to miss your dose. Call your care team if you are unable to keep an appointment. What may interact with this medication? Do not take this medication with any of the following: Deferasirox Deferoxamine Dimercaprol This medication may also interact with the following: Other iron products This list may not describe all possible interactions. Give your health care provider a list of all the medicines, herbs, non-prescription drugs, or dietary supplements you use. Also tell them if you smoke, drink  alcohol, or use illegal drugs. Some items may interact with your medicine. What should I watch for while using this medication? Your condition will be monitored carefully while you are receiving this medication. Visit your care team for regular checks on your progress. You may need blood work while you are taking this medication. What side effects may I notice from receiving this medication? Side effects that you should report to your care team as soon as possible: Allergic reactions--skin rash, itching, hives, swelling of the face, lips, tongue, or throat Low blood pressure--dizziness, feeling faint or lightheaded, blurry vision Shortness of breath Side effects that usually do not require medical attention (report to your care team if they continue or are bothersome): Flushing Headache Joint pain Muscle pain Nausea Pain, redness, or irritation at injection site This list may not describe all possible side effects. Call your doctor for medical advice about side effects. You may report side effects to FDA at 1-800-FDA-1088. Where should I keep my medication? This medication is given in a hospital or clinic and will not be stored at home. NOTE: This sheet is a summary. It may not cover all possible information. If you have questions about this medicine, talk to your doctor, pharmacist, or health care provider.  2024 Elsevier/Gold Standard (2021-01-11 00:00:00)

## 2023-05-07 ENCOUNTER — Other Ambulatory Visit: Payer: Self-pay | Admitting: Hematology & Oncology

## 2023-05-09 ENCOUNTER — Encounter: Payer: Self-pay | Admitting: Rheumatology

## 2023-06-05 ENCOUNTER — Other Ambulatory Visit: Payer: Medicare Other

## 2023-06-05 ENCOUNTER — Ambulatory Visit: Payer: Medicare Other | Admitting: Hematology & Oncology

## 2023-06-06 ENCOUNTER — Other Ambulatory Visit: Payer: Self-pay | Admitting: Hematology & Oncology

## 2023-07-02 ENCOUNTER — Inpatient Hospital Stay: Payer: Medicare Other | Attending: Hematology & Oncology

## 2023-07-02 ENCOUNTER — Encounter: Payer: Self-pay | Admitting: Hematology & Oncology

## 2023-07-02 ENCOUNTER — Inpatient Hospital Stay: Payer: Medicare Other | Admitting: Hematology & Oncology

## 2023-07-02 VITALS — BP 124/74 | HR 68 | Temp 98.6°F | Resp 20 | Ht 62.0 in | Wt 163.0 lb

## 2023-07-02 DIAGNOSIS — K909 Intestinal malabsorption, unspecified: Secondary | ICD-10-CM | POA: Insufficient documentation

## 2023-07-02 DIAGNOSIS — Z170 Estrogen receptor positive status [ER+]: Secondary | ICD-10-CM | POA: Insufficient documentation

## 2023-07-02 DIAGNOSIS — C50912 Malignant neoplasm of unspecified site of left female breast: Secondary | ICD-10-CM | POA: Diagnosis present

## 2023-07-02 DIAGNOSIS — D508 Other iron deficiency anemias: Secondary | ICD-10-CM

## 2023-07-02 DIAGNOSIS — K2970 Gastritis, unspecified, without bleeding: Secondary | ICD-10-CM | POA: Diagnosis not present

## 2023-07-02 DIAGNOSIS — D631 Anemia in chronic kidney disease: Secondary | ICD-10-CM | POA: Diagnosis present

## 2023-07-02 DIAGNOSIS — Z79811 Long term (current) use of aromatase inhibitors: Secondary | ICD-10-CM | POA: Insufficient documentation

## 2023-07-02 DIAGNOSIS — D500 Iron deficiency anemia secondary to blood loss (chronic): Secondary | ICD-10-CM

## 2023-07-02 DIAGNOSIS — D0512 Intraductal carcinoma in situ of left breast: Secondary | ICD-10-CM

## 2023-07-02 DIAGNOSIS — N182 Chronic kidney disease, stage 2 (mild): Secondary | ICD-10-CM | POA: Insufficient documentation

## 2023-07-02 DIAGNOSIS — K51018 Ulcerative (chronic) pancolitis with other complication: Secondary | ICD-10-CM

## 2023-07-02 LAB — RETICULOCYTES
Immature Retic Fract: 17.6 % — ABNORMAL HIGH (ref 2.3–15.9)
RBC.: 3.74 MIL/uL — ABNORMAL LOW (ref 3.87–5.11)
Retic Count, Absolute: 68.8 10*3/uL (ref 19.0–186.0)
Retic Ct Pct: 1.8 % (ref 0.4–3.1)

## 2023-07-02 LAB — CBC WITH DIFFERENTIAL (CANCER CENTER ONLY)
Abs Immature Granulocytes: 0.08 10*3/uL — ABNORMAL HIGH (ref 0.00–0.07)
Basophils Absolute: 0 10*3/uL (ref 0.0–0.1)
Basophils Relative: 1 %
Eosinophils Absolute: 0.2 10*3/uL (ref 0.0–0.5)
Eosinophils Relative: 5 %
HCT: 32.8 % — ABNORMAL LOW (ref 36.0–46.0)
Hemoglobin: 10 g/dL — ABNORMAL LOW (ref 12.0–15.0)
Immature Granulocytes: 2 %
Lymphocytes Relative: 22 %
Lymphs Abs: 1 10*3/uL (ref 0.7–4.0)
MCH: 26.2 pg (ref 26.0–34.0)
MCHC: 30.5 g/dL (ref 30.0–36.0)
MCV: 86.1 fL (ref 80.0–100.0)
Monocytes Absolute: 0.4 10*3/uL (ref 0.1–1.0)
Monocytes Relative: 9 %
Neutro Abs: 2.9 10*3/uL (ref 1.7–7.7)
Neutrophils Relative %: 61 %
Platelet Count: 201 10*3/uL (ref 150–400)
RBC: 3.81 MIL/uL — ABNORMAL LOW (ref 3.87–5.11)
RDW: 13.9 % (ref 11.5–15.5)
WBC Count: 4.6 10*3/uL (ref 4.0–10.5)
nRBC: 0 % (ref 0.0–0.2)

## 2023-07-02 LAB — IRON AND IRON BINDING CAPACITY (CC-WL,HP ONLY)
Iron: 38 ug/dL (ref 28–170)
Saturation Ratios: 7 % — ABNORMAL LOW (ref 10.4–31.8)
TIBC: 574 ug/dL — ABNORMAL HIGH (ref 250–450)
UIBC: 536 ug/dL — ABNORMAL HIGH (ref 148–442)

## 2023-07-02 LAB — CMP (CANCER CENTER ONLY)
ALT: 15 U/L (ref 0–44)
AST: 22 U/L (ref 15–41)
Albumin: 4.6 g/dL (ref 3.5–5.0)
Alkaline Phosphatase: 97 U/L (ref 38–126)
Anion gap: 7 (ref 5–15)
BUN: 15 mg/dL (ref 6–20)
CO2: 30 mmol/L (ref 22–32)
Calcium: 9.8 mg/dL (ref 8.9–10.3)
Chloride: 104 mmol/L (ref 98–111)
Creatinine: 0.76 mg/dL (ref 0.44–1.00)
GFR, Estimated: >60 mL/min (ref >60)
Glucose, Bld: 166 mg/dL — ABNORMAL HIGH (ref 70–99)
Potassium: 3.9 mmol/L (ref 3.5–5.1)
Sodium: 141 mmol/L (ref 135–145)
Total Bilirubin: 0.6 mg/dL (ref 0.3–1.2)
Total Protein: 7.9 g/dL (ref 6.5–8.1)

## 2023-07-02 LAB — FERRITIN: Ferritin: 11 ng/mL (ref 11–307)

## 2023-07-02 MED ORDER — ANASTROZOLE 1 MG PO TABS: 1.0000 mg | ORAL_TABLET | Freq: Every morning | ORAL | 5 refills | Status: DC

## 2023-07-02 NOTE — Progress Notes (Signed)
Hematology and Oncology Follow Up Visit  Rhonda Gutierrez [id number] 02-May-1961 62 y.o. 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 daily   Interim History:  Rhonda Gutierrez is here today for follow-up.  She has not yet moved to [place].  Her mother is having health issues.  It sounds like she might be going to [place] after the holiday season.  Otherwise, she is doing okay.  Her blood sugar is doing better.  She is having no problems with nausea or vomiting.  She is having no change in bowel or bladder habits.  She has had no leg swelling.  There has been no bleeding.  She does have bad arthritis issues.  She does see Rheumatology.  I told her that she could certainly try some over-the-counter nonsteroidals.  She must take them with food.  We have given her IV iron.  When she was last here back in August, her ferritin was 15 with an iron saturation of 11%.   Medications:  Allergies as of 07/02/2023       Reactions   Aspartame Other (See Comments)   shaking   Clarithromycin Rash   Codeine Nausea And Vomiting   Mesalamine Other (See Comments)   "Sees things"   Rofecoxib Hives, Other (See Comments)   Hallucinations   Sulfasalazine Rash, Other (See Comments)   fever   Fish Oil Cough   Losartan Cough   Lyumjev [insulin Lispro] Rash   Sulfa Antibiotics Rash, Other (See Comments)   fever   Sulfonamide Derivatives Rash, Other (See Comments)   fever        Medication List        Accurate as of July 02, 2023 12:52 PM. If you  have any questions, ask your nurse or doctor.          alendronate 70 MG tablet Commonly known as: FOSAMAX Take 70 mg by mouth once a week. Take with a full glass of water on an empty stomach.   ALPRAZolam 0.25 MG tablet Commonly known as: XANAX Take 0.25 mg by mouth 3 (three) times daily as needed.   anastrozole 1 MG tablet Commonly known as: ARIMIDEX TAKE 1 TABLET BY MOUTH IN THE MORNING   aspirin 81 MG tablet Take 81 mg by mouth daily.   atorvastatin 80 MG tablet Commonly known as: LIPITOR Take 80 mg by mouth daily.   Baqsimi Two Pack 3 MG/DOSE Powd Generic drug: Glucagon Place into the nose.   calcium carbonate 500 MG chewable tablet Commonly known as: TUMS - dosed in mg elemental calcium Chew 3 tablets by mouth daily as needed.   calcium-vitamin D 500-200 MG-UNIT tablet Commonly known as: OSCAL WITH D Take 1 tablet by mouth 2 (two) times daily.   cetirizine 10 MG tablet Commonly known as: ZYRTEC Take 10 mg by mouth daily as needed.   clobetasol ointment 0.05 % Commonly known as: TEMOVATE Apply topically 2 (two) times daily.   cyanocobalamin 1000 MCG/ML injection Commonly known as: VITAMIN B12 Inject into the muscle every 30 (thirty) days.  desonide 0.05 % cream Commonly known as: DESOWEN Apply topically 2 (two) times daily.   escitalopram 20 MG tablet Commonly known as: LEXAPRO Take 20 mg by mouth daily.   famotidine 20 MG tablet Commonly known as: PEPCID Take 20 mg by mouth 2 (two) times daily.   fenofibrate 160 MG tablet Take 160 mg by mouth daily.   fluticasone 50 MCG/ACT nasal spray Commonly known as: FLONASE Place into both nostrils daily.   folic acid 1 MG tablet Commonly known as: FOLVITE Take 1 mg by mouth daily.   gabapentin 600 MG tablet Commonly known as: NEURONTIN Take 600 mg by mouth 3 (three) times daily.   insulin lispro 100 UNIT/ML KiwkPen Commonly known as: HumaLOG KwikPen Use as directed if pump does not work What  changed: Another medication with the same name was changed. Make sure you understand how and when to take each.   HumaLOG 100 UNIT/ML injection Generic drug: insulin lispro USE IN INSULIN PUMP FOR A TOTAL OF 230 UNITS PER DAY What changed: See the new instructions.   loperamide 2 MG capsule Commonly known as: IMODIUM Take 6 mg by mouth 2 (two) times daily.   Magnesium Oxide 400 MG Caps daily.   multivitamin tablet Take 1 tablet by mouth daily.   pantoprazole 40 MG tablet Commonly known as: PROTONIX Take 1 tablet by mouth 2 (two) times daily.   ramipril 2.5 MG capsule Commonly known as: ALTACE Take 2.5 mg by mouth 2 (two) times daily. Further refills through PCP.   simethicone 125 MG chewable tablet Commonly known as: MYLICON Chew 125 mg by mouth as needed for flatulence.   Vitamin D3 25 MCG (1000 UT) Caps Take 1,000 Units by mouth daily.        Allergies:  Allergies  Allergen Reactions   Aspartame Other (See Comments)    shaking   Clarithromycin Rash   Codeine Nausea And Vomiting   Mesalamine Other (See Comments)    "Sees things"   Rofecoxib Hives and Other (See Comments)    Hallucinations   Sulfasalazine Rash and Other (See Comments)    fever   Fish Oil Cough   Losartan Cough   Lyumjev [Insulin Lispro] Rash   Sulfa Antibiotics Rash and Other (See Comments)    fever    Sulfonamide Derivatives Rash and Other (See Comments)    fever    Past Medical History, Surgical history, Social history, and Family History were reviewed and updated.  Review of Systems: Review of Systems  Constitutional:  Positive for malaise/fatigue.  HENT: Negative.    Eyes: Negative.   Respiratory: Negative.    Cardiovascular: Negative.   Gastrointestinal: Negative.   Genitourinary: Negative.   Musculoskeletal: Negative.   Skin: Negative.   Neurological: Negative.   Endo/Heme/Allergies: Negative.   Psychiatric/Behavioral: Negative.       Physical Exam:  height is 5\' 2"   (1.575 m) and weight is 163 lb (73.9 kg). Her oral temperature is 98.6 F (37 C). Her blood pressure is 124/74 and her pulse is 68. Her respiration is 20 and oxygen saturation is 100%.   Wt Readings from Last 3 Encounters:  07/02/23 163 lb (73.9 kg)  04/24/23 160 lb (72.6 kg)  02/12/23 173 lb 1.3 oz (78.5 kg)    Physical Exam Vitals reviewed.  Constitutional:      Comments: Her breast exam shows right breast no masses, edema or erythema.  There is no right axillary adenopathy.  Left breast shows the lumpectomy scar at  about the 2 o'clock position.  This is well-healed.  There is some slight contraction of the left breast from radiation.  She does have some erythema under the left breast.  She has a well-healed lumpectomy scar.  There is no fullness in the left axilla.  HENT:     Head: Normocephalic and atraumatic.  Eyes:     Pupils: Pupils are equal, round, and reactive to light.  Cardiovascular:     Rate and Rhythm: Normal rate and regular rhythm.     Heart sounds: Normal heart sounds.  Pulmonary:     Effort: Pulmonary effort is normal.     Breath sounds: Normal breath sounds.  Abdominal:     General: Bowel sounds are normal.     Palpations: Abdomen is soft.  Musculoskeletal:        General: No tenderness or deformity. Normal range of motion.     Cervical back: Normal range of motion.  Lymphadenopathy:     Cervical: No cervical adenopathy.  Skin:    General: Skin is warm and dry.     Findings: No erythema or rash.  Neurological:     Mental Status: She is alert and oriented to person, place, and time.  Psychiatric:        Behavior: Behavior normal.        Thought Content: Thought content normal.        Judgment: Judgment normal.      Lab Results  Component Value Date   WBC 4.6 07/02/2023   HGB 10.0 (L) 07/02/2023   HCT 32.8 (L) 07/02/2023   MCV 86.1 07/02/2023   PLT 201 07/02/2023   Lab Results  Component Value Date   FERRITIN 15 04/24/2023   IRON 63 04/24/2023    TIBC 559 (H) 04/24/2023   UIBC 496 (H) 04/24/2023   IRONPCTSAT 11 04/24/2023   Lab Results  Component Value Date   RETICCTPCT 1.8 07/02/2023   RBC 3.74 (L) 07/02/2023   No results found for: "KPAFRELGTCHN", "LAMBDASER", "KAPLAMBRATIO" No results found for: "IGGSERUM", "IGA", "IGMSERUM" No results found for: "TOTALPROTELP", "ALBUMINELP", "A1GS", "A2GS", "BETS", "BETA2SER", "GAMS", "MSPIKE", "SPEI"   Chemistry      Component Value Date/Time   NA 137 04/24/2023 1120   K 4.6 04/24/2023 1120   CL 100 04/24/2023 1120   CO2 29 04/24/2023 1120   BUN 12 04/24/2023 1120   CREATININE 0.91 04/24/2023 1120      Component Value Date/Time   CALCIUM 10.4 (H) 04/24/2023 1120   CALCIUM 9.7 04/08/2011 1053   ALKPHOS 84 04/24/2023 1120   AST 28 04/24/2023 1120   ALT 16 04/24/2023 1120   BILITOT 0.9 04/24/2023 1120      Impression and Plan: Ms. 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  Her recent problem is the early stage-stage I-infiltrating ductal carcinoma of the left breast.  This was found incidentally.  She underwent a lumpectomy and had radiation therapy.  All this was completed in April 2021.  She is on Arimidex.  Her hemoglobin is dropping little bit.  I am unsure if she qualifies for Aranesp as of yet.  We will have to see what her iron studies look like.  We will likely plan to get her back probably several times in December before she heads off to [place].   Josph Macho, MD 10/31/202412:52 PM

## 2023-07-03 ENCOUNTER — Encounter: Payer: Self-pay | Admitting: *Deleted

## 2023-07-03 ENCOUNTER — Other Ambulatory Visit: Payer: Self-pay | Admitting: *Deleted

## 2023-07-03 LAB — CANCER ANTIGEN 27.29: CA 27.29: 16.9 U/mL (ref 0.0–38.6)

## 2023-07-06 ENCOUNTER — Other Ambulatory Visit: Payer: Self-pay | Admitting: *Deleted

## 2023-07-06 MED ORDER — CELECOXIB 100 MG PO CAPS: 100.0000 mg | ORAL_CAPSULE | Freq: Two times a day (BID) | ORAL | 0 refills | Status: DC

## 2023-07-14 ENCOUNTER — Inpatient Hospital Stay: Payer: Medicare Other | Attending: Hematology & Oncology

## 2023-07-14 VITALS — BP 102/60 | HR 68 | Temp 97.8°F | Resp 18

## 2023-07-14 DIAGNOSIS — C50912 Malignant neoplasm of unspecified site of left female breast: Secondary | ICD-10-CM | POA: Diagnosis present

## 2023-07-14 DIAGNOSIS — D631 Anemia in chronic kidney disease: Secondary | ICD-10-CM | POA: Insufficient documentation

## 2023-07-14 DIAGNOSIS — D508 Other iron deficiency anemias: Secondary | ICD-10-CM

## 2023-07-14 DIAGNOSIS — N182 Chronic kidney disease, stage 2 (mild): Secondary | ICD-10-CM | POA: Diagnosis present

## 2023-07-14 MED ORDER — SODIUM CHLORIDE 0.9 % IV SOLN: Freq: Once | INTRAVENOUS | Status: AC

## 2023-07-14 MED ORDER — SODIUM CHLORIDE 0.9 % IV SOLN
125.0000 mg | Freq: Once | INTRAVENOUS | Status: AC
  Administered 2023-07-14: 125 mg via INTRAVENOUS
  Filled 2023-07-14: qty 10

## 2023-07-14 NOTE — Patient Instructions (Addendum)
Sodium Ferric Gluconate Complex Injection What is this medication? SODIUM FERRIC GLUCONATE COMPLEX (SOE dee um FER ik GLOO koe nate KOM pleks) treats low levels of iron (iron deficiency anemia) in people with kidney disease. Iron is a mineral that plays an important role in making red blood cells, which carry oxygen from your lungs to the rest of your body. This medicine may be used for other purposes; ask your health care provider or pharmacist if you have questions. COMMON BRAND NAME(S): Ferrlecit, Nulecit What should I tell my care team before I take this medication? They need to know if you have any of the following conditions: Anemia that is not from iron deficiency High levels of iron in the blood An unusual or allergic reaction to iron, other medications, foods, dyes, or preservatives Pregnant or are trying to become pregnant Breast-feeding How should I use this medication? This medication is injected into a vein. It is given by your care team in a hospital or clinic setting. Talk to your care team about the use of this medication in children. While it may be prescribed for children as young as 6 years for selected conditions, precautions do apply. Overdosage: If you think you have taken too much of this medicine contact a poison control center or emergency room at once. NOTE: This medicine is only for you. Do not share this medicine with others. What if I miss a dose? It is important not to miss your dose. Call your care team if you are unable to keep an appointment. What may interact with this medication? Do not take this medication with any of the following: Deferasirox Deferoxamine Dimercaprol This medication may also interact with the following: Other iron products This list may not describe all possible interactions. Give your health care provider a list of all the medicines, herbs, non-prescription drugs, or dietary supplements you use. Also tell them if you smoke, drink  alcohol, or use illegal drugs. Some items may interact with your medicine. What should I watch for while using this medication? Your condition will be monitored carefully while you are receiving this medication. Visit your care team for regular checks on your progress. You may need blood work while you are taking this medication. What side effects may I notice from receiving this medication? Side effects that you should report to your care team as soon as possible: Allergic reactions--skin rash, itching, hives, swelling of the face, lips, tongue, or throat Low blood pressure--dizziness, feeling faint or lightheaded, blurry vision Shortness of breath Side effects that usually do not require medical attention (report to your care team if they continue or are bothersome): Flushing Headache Joint pain Muscle pain Nausea Pain, redness, or irritation at injection site This list may not describe all possible side effects. Call your doctor for medical advice about side effects. You may report side effects to FDA at 1-800-FDA-1088. Where should I keep my medication? This medication is given in a hospital or clinic and will not be stored at home. NOTE: This sheet is a summary. It may not cover all possible information. If you have questions about this medicine, talk to your doctor, pharmacist, or health care provider.  2024 Elsevier/Gold Standard (2021-01-11 00:00:00)

## 2023-07-21 ENCOUNTER — Inpatient Hospital Stay: Payer: Medicare Other

## 2023-07-21 ENCOUNTER — Encounter: Payer: Self-pay | Admitting: Family

## 2023-07-21 VITALS — BP 117/67 | HR 67 | Temp 97.9°F | Resp 17

## 2023-07-21 DIAGNOSIS — D508 Other iron deficiency anemias: Secondary | ICD-10-CM

## 2023-07-21 DIAGNOSIS — C50912 Malignant neoplasm of unspecified site of left female breast: Secondary | ICD-10-CM | POA: Diagnosis not present

## 2023-07-21 MED ORDER — SODIUM CHLORIDE 0.9 % IV SOLN
125.0000 mg | Freq: Once | INTRAVENOUS | Status: AC
  Administered 2023-07-21: 125 mg via INTRAVENOUS
  Filled 2023-07-21: qty 10

## 2023-07-21 MED ORDER — SODIUM CHLORIDE 0.9 % IV SOLN: Freq: Once | INTRAVENOUS | Status: AC

## 2023-07-21 NOTE — Patient Instructions (Signed)
Sodium Ferric Gluconate Complex Injection What is this medication? SODIUM FERRIC GLUCONATE COMPLEX (SOE dee um FER ik GLOO koe nate KOM pleks) treats low levels of iron (iron deficiency anemia) in people with kidney disease. Iron is a mineral that plays an important role in making red blood cells, which carry oxygen from your lungs to the rest of your body. This medicine may be used for other purposes; ask your health care provider or pharmacist if you have questions. COMMON BRAND NAME(S): Ferrlecit, Nulecit What should I tell my care team before I take this medication? They need to know if you have any of the following conditions: Anemia that is not from iron deficiency High levels of iron in the blood An unusual or allergic reaction to iron, other medications, foods, dyes, or preservatives Pregnant or are trying to become pregnant Breast-feeding How should I use this medication? This medication is injected into a vein. It is given by your care team in a hospital or clinic setting. Talk to your care team about the use of this medication in children. While it may be prescribed for children as young as 6 years for selected conditions, precautions do apply. Overdosage: If you think you have taken too much of this medicine contact a poison control center or emergency room at once. NOTE: This medicine is only for you. Do not share this medicine with others. What if I miss a dose? It is important not to miss your dose. Call your care team if you are unable to keep an appointment. What may interact with this medication? Do not take this medication with any of the following: Deferasirox Deferoxamine Dimercaprol This medication may also interact with the following: Other iron products This list may not describe all possible interactions. Give your health care provider a list of all the medicines, herbs, non-prescription drugs, or dietary supplements you use. Also tell them if you smoke, drink  alcohol, or use illegal drugs. Some items may interact with your medicine. What should I watch for while using this medication? Your condition will be monitored carefully while you are receiving this medication. Visit your care team for regular checks on your progress. You may need blood work while you are taking this medication. What side effects may I notice from receiving this medication? Side effects that you should report to your care team as soon as possible: Allergic reactions--skin rash, itching, hives, swelling of the face, lips, tongue, or throat Low blood pressure--dizziness, feeling faint or lightheaded, blurry vision Shortness of breath Side effects that usually do not require medical attention (report to your care team if they continue or are bothersome): Flushing Headache Joint pain Muscle pain Nausea Pain, redness, or irritation at injection site This list may not describe all possible side effects. Call your doctor for medical advice about side effects. You may report side effects to FDA at 1-800-FDA-1088. Where should I keep my medication? This medication is given in a hospital or clinic and will not be stored at home. NOTE: This sheet is a summary. It may not cover all possible information. If you have questions about this medicine, talk to your doctor, pharmacist, or health care provider.  2024 Elsevier/Gold Standard (2021-01-11 00:00:00)

## 2023-07-28 ENCOUNTER — Inpatient Hospital Stay: Payer: Medicare Other

## 2023-07-28 VITALS — BP 123/65 | HR 75 | Temp 97.7°F | Resp 17

## 2023-07-28 DIAGNOSIS — C50912 Malignant neoplasm of unspecified site of left female breast: Secondary | ICD-10-CM | POA: Diagnosis not present

## 2023-07-28 DIAGNOSIS — D508 Other iron deficiency anemias: Secondary | ICD-10-CM

## 2023-07-28 MED ORDER — SODIUM CHLORIDE 0.9 % IV SOLN: Freq: Once | INTRAVENOUS | Status: AC

## 2023-07-28 MED ORDER — SODIUM CHLORIDE 0.9 % IV SOLN
125.0000 mg | Freq: Once | INTRAVENOUS | Status: AC
  Administered 2023-07-28: 125 mg via INTRAVENOUS
  Filled 2023-07-28: qty 10

## 2023-07-28 NOTE — Patient Instructions (Signed)
Sodium Ferric Gluconate Complex Injection What is this medication? SODIUM FERRIC GLUCONATE COMPLEX (SOE dee um FER ik GLOO koe nate KOM pleks) treats low levels of iron (iron deficiency anemia) in people with kidney disease. Iron is a mineral that plays an important role in making red blood cells, which carry oxygen from your lungs to the rest of your body. This medicine may be used for other purposes; ask your health care provider or pharmacist if you have questions. COMMON BRAND NAME(S): Ferrlecit, Nulecit What should I tell my care team before I take this medication? They need to know if you have any of the following conditions: Anemia that is not from iron deficiency High levels of iron in the blood An unusual or allergic reaction to iron, other medications, foods, dyes, or preservatives Pregnant or are trying to become pregnant Breast-feeding How should I use this medication? This medication is injected into a vein. It is given by your care team in a hospital or clinic setting. Talk to your care team about the use of this medication in children. While it may be prescribed for children as young as 6 years for selected conditions, precautions do apply. Overdosage: If you think you have taken too much of this medicine contact a poison control center or emergency room at once. NOTE: This medicine is only for you. Do not share this medicine with others. What if I miss a dose? It is important not to miss your dose. Call your care team if you are unable to keep an appointment. What may interact with this medication? Do not take this medication with any of the following: Deferasirox Deferoxamine Dimercaprol This medication may also interact with the following: Other iron products This list may not describe all possible interactions. Give your health care provider a list of all the medicines, herbs, non-prescription drugs, or dietary supplements you use. Also tell them if you smoke, drink  alcohol, or use illegal drugs. Some items may interact with your medicine. What should I watch for while using this medication? Your condition will be monitored carefully while you are receiving this medication. Visit your care team for regular checks on your progress. You may need blood work while you are taking this medication. What side effects may I notice from receiving this medication? Side effects that you should report to your care team as soon as possible: Allergic reactions--skin rash, itching, hives, swelling of the face, lips, tongue, or throat Low blood pressure--dizziness, feeling faint or lightheaded, blurry vision Shortness of breath Side effects that usually do not require medical attention (report to your care team if they continue or are bothersome): Flushing Headache Joint pain Muscle pain Nausea Pain, redness, or irritation at injection site This list may not describe all possible side effects. Call your doctor for medical advice about side effects. You may report side effects to FDA at 1-800-FDA-1088. Where should I keep my medication? This medication is given in a hospital or clinic and will not be stored at home. NOTE: This sheet is a summary. It may not cover all possible information. If you have questions about this medicine, talk to your doctor, pharmacist, or health care provider.  2024 Elsevier/Gold Standard (2021-01-11 00:00:00)

## 2023-07-29 ENCOUNTER — Encounter: Payer: Self-pay | Admitting: Hematology & Oncology

## 2023-08-14 ENCOUNTER — Encounter: Payer: Self-pay | Admitting: Hematology & Oncology

## 2023-08-14 ENCOUNTER — Inpatient Hospital Stay: Payer: Medicare Other

## 2023-08-14 ENCOUNTER — Inpatient Hospital Stay: Payer: Medicare Other | Admitting: Hematology & Oncology

## 2023-08-14 ENCOUNTER — Telehealth: Payer: Self-pay

## 2023-08-14 ENCOUNTER — Inpatient Hospital Stay: Payer: Medicare Other | Attending: Hematology & Oncology

## 2023-08-14 VITALS — BP 141/81 | HR 85 | Temp 98.0°F | Resp 18 | Ht 62.0 in | Wt 164.1 lb

## 2023-08-14 DIAGNOSIS — D508 Other iron deficiency anemias: Secondary | ICD-10-CM

## 2023-08-14 DIAGNOSIS — Z79811 Long term (current) use of aromatase inhibitors: Secondary | ICD-10-CM | POA: Insufficient documentation

## 2023-08-14 DIAGNOSIS — N182 Chronic kidney disease, stage 2 (mild): Secondary | ICD-10-CM | POA: Insufficient documentation

## 2023-08-14 DIAGNOSIS — E538 Deficiency of other specified B group vitamins: Secondary | ICD-10-CM | POA: Diagnosis not present

## 2023-08-14 DIAGNOSIS — C50912 Malignant neoplasm of unspecified site of left female breast: Secondary | ICD-10-CM | POA: Insufficient documentation

## 2023-08-14 DIAGNOSIS — D631 Anemia in chronic kidney disease: Secondary | ICD-10-CM

## 2023-08-14 DIAGNOSIS — Z170 Estrogen receptor positive status [ER+]: Secondary | ICD-10-CM | POA: Diagnosis not present

## 2023-08-14 LAB — CBC WITH DIFFERENTIAL (CANCER CENTER ONLY)
Abs Immature Granulocytes: 0.04 10*3/uL (ref 0.00–0.07)
Basophils Absolute: 0.1 10*3/uL (ref 0.0–0.1)
Basophils Relative: 1 %
Eosinophils Absolute: 0.2 10*3/uL (ref 0.0–0.5)
Eosinophils Relative: 4 %
HCT: 33.4 % — ABNORMAL LOW (ref 36.0–46.0)
Hemoglobin: 10.3 g/dL — ABNORMAL LOW (ref 12.0–15.0)
Immature Granulocytes: 1 %
Lymphocytes Relative: 23 %
Lymphs Abs: 1.2 10*3/uL (ref 0.7–4.0)
MCH: 27.1 pg (ref 26.0–34.0)
MCHC: 30.8 g/dL (ref 30.0–36.0)
MCV: 87.9 fL (ref 80.0–100.0)
Monocytes Absolute: 0.3 10*3/uL (ref 0.1–1.0)
Monocytes Relative: 6 %
Neutro Abs: 3.5 10*3/uL (ref 1.7–7.7)
Neutrophils Relative %: 65 %
Platelet Count: 193 10*3/uL (ref 150–400)
RBC: 3.8 MIL/uL — ABNORMAL LOW (ref 3.87–5.11)
RDW: 15.2 % (ref 11.5–15.5)
WBC Count: 5.3 10*3/uL (ref 4.0–10.5)
nRBC: 0 % (ref 0.0–0.2)

## 2023-08-14 LAB — CMP (CANCER CENTER ONLY)
ALT: 23 U/L (ref 0–44)
AST: 31 U/L (ref 15–41)
Albumin: 4.1 g/dL (ref 3.5–5.0)
Alkaline Phosphatase: 100 U/L (ref 38–126)
Anion gap: 8 (ref 5–15)
BUN: 17 mg/dL (ref 6–20)
CO2: 28 mmol/L (ref 22–32)
Calcium: 10.4 mg/dL — ABNORMAL HIGH (ref 8.9–10.3)
Chloride: 107 mmol/L (ref 98–111)
Creatinine: 0.95 mg/dL (ref 0.44–1.00)
GFR, Estimated: >60 mL/min (ref >60)
Glucose, Bld: 149 mg/dL — ABNORMAL HIGH (ref 70–99)
Potassium: 5.1 mmol/L (ref 3.5–5.1)
Sodium: 143 mmol/L (ref 135–145)
Total Bilirubin: 0.5 mg/dL (ref <1.2)
Total Protein: 7.4 g/dL (ref 6.5–8.1)

## 2023-08-14 LAB — RETICULOCYTES
Immature Retic Fract: 24.6 % — ABNORMAL HIGH (ref 2.3–15.9)
RBC.: 3.76 MIL/uL — ABNORMAL LOW (ref 3.87–5.11)
Retic Count, Absolute: 76 10*3/uL (ref 19.0–186.0)
Retic Ct Pct: 2 % (ref 0.4–3.1)

## 2023-08-14 LAB — IRON AND IRON BINDING CAPACITY (CC-WL,HP ONLY)
Iron: 76 ug/dL (ref 28–170)
Saturation Ratios: 15 % (ref 10.4–31.8)
TIBC: 493 ug/dL — ABNORMAL HIGH (ref 250–450)
UIBC: 417 ug/dL (ref 148–442)

## 2023-08-14 LAB — FERRITIN: Ferritin: 59 ng/mL (ref 11–307)

## 2023-08-14 MED ORDER — DARBEPOETIN ALFA 300 MCG/0.6ML IJ SOSY
300.0000 ug | PREFILLED_SYRINGE | Freq: Once | INTRAMUSCULAR | Status: AC
  Administered 2023-08-14: 300 ug via SUBCUTANEOUS
  Filled 2023-08-14: qty 0.6

## 2023-08-14 NOTE — Telephone Encounter (Signed)
-----   Message from Rhonda Gutierrez sent at 08/14/2023  4:00 PM EST ----- Please call and let her know that the iron is borderline.  I would give her a dose of IV iron.  Please set this up for next week.

## 2023-08-14 NOTE — Patient Instructions (Signed)

## 2023-08-14 NOTE — Progress Notes (Signed)
Hematology and Oncology Follow Up Visit  Rhonda Gutierrez [id number] [birth year] 62 y.o. 08/14/2023   Principle Diagnosis:  Iron deficiency anemia secondary to malabsorption  Anemia of chronic kidney failure stage 2 Pernicious anemia Stage 1 (T1bN0M0) infiltrating ductal carcinoma of the left breast-ER positive/PR positive/HER-2 negative --Oncotype score 13 --status post lobectomy on 09/27/2019  Current Therapy:   IV iron as indicated -Ferrlecit given on 07/28/2023      Aranesp 300 mg sq q 3-4 weeks for Hgb < 11 Vitamin B12 1 mg IM monthly-given by patient at home Arimidex 1 mg p.o. daily   Interim History:  Rhonda Gutierrez is here today for follow-up.  Does go over to [place].  Hopefully, should be over there more long-term next year.  Her mother is in a nursing home.  She has been in her family I try to move things out of her house.  She apparently is going to have a diagnostic mammogram for the left breast.  She had an ultrasound that was done.  On screening, there was some calcifications that were noted.  Hopefully, this is nothing but fat necrosis.  She is still dealing with her kidneys.  She does see Nephrology.  She does have Rheumatology for her arthritis.  Her blood sugars seem to be managing fairly well right now..  She has had no fever.  She has had no cough.  She has had no nausea or vomiting.  We just have a tough time given her hemoglobin of high enough.  She could did get iron back in November.  At that time, her ferritin was 11 with an iron saturation of 7%..  She really does need Aranesp in my opinion.  Hopefully we can give her a dose of Aranesp today.  Overall, I would say her performance status is ECOG 1.   Medications:  Allergies as of 08/14/2023       Reactions   Aspartame Other (See Comments)   shaking   Clarithromycin Rash   Codeine Nausea And Vomiting   Mesalamine Other (See Comments)   "Sees things"   Rofecoxib Hives, Other (See Comments)    Hallucinations   Sulfasalazine Rash, Other (See Comments)   fever   Fish Oil Cough   Losartan Cough   Lyumjev [insulin Lispro] Rash   Sulfa Antibiotics Rash, Other (See Comments)   fever   Sulfonamide Derivatives Rash, Other (See Comments)   fever        Medication List        Accurate as of August 14, 2023  1:06 PM. If you have any questions, ask your nurse or doctor.          alendronate 70 MG tablet Commonly known as: FOSAMAX Take 70 mg by mouth once a week. Take with a full glass of water on an empty stomach.   ALPRAZolam 0.25 MG tablet Commonly known as: XANAX Take 0.25 mg by mouth 3 (three) times daily as needed.   anastrozole 1 MG tablet Commonly known as: ARIMIDEX Take 1 tablet (1 mg total) by mouth every morning.   aspirin 81 MG tablet Take 81 mg by mouth daily.   atorvastatin 80 MG tablet Commonly known as: LIPITOR Take 80 mg by mouth daily.   Baqsimi Two Pack 3 MG/DOSE Powd Generic drug: Glucagon Place into the nose.   calcium carbonate 500 MG chewable tablet Commonly known as: TUMS - dosed in mg elemental calcium Chew 3 tablets by mouth daily as needed.   calcium-vitamin D  500-200 MG-UNIT tablet Commonly known as: OSCAL WITH D Take 1 tablet by mouth 2 (two) times daily.   celecoxib 100 MG capsule Commonly known as: CeleBREX Take 1 capsule (100 mg total) by mouth 2 (two) times daily.   cetirizine 10 MG tablet Commonly known as: ZYRTEC Take 10 mg by mouth daily as needed.   clobetasol ointment 0.05 % Commonly known as: TEMOVATE Apply topically 2 (two) times daily.   cyanocobalamin 1000 MCG/ML injection Commonly known as: VITAMIN B12 Inject into the muscle every 30 (thirty) days.   desonide 0.05 % cream Commonly known as: DESOWEN Apply topically 2 (two) times daily.   escitalopram 20 MG tablet Commonly known as: LEXAPRO Take 20 mg by mouth daily.   famotidine 20 MG tablet Commonly known as: PEPCID Take 20 mg by mouth 2 (two)  times daily.   fenofibrate 160 MG tablet Take 160 mg by mouth daily.   fluticasone 50 MCG/ACT nasal spray Commonly known as: FLONASE Place into both nostrils daily.   folic acid 1 MG tablet Commonly known as: FOLVITE Take 1 mg by mouth daily.   gabapentin 600 MG tablet Commonly known as: NEURONTIN Take 600 mg by mouth 3 (three) times daily.   insulin lispro 100 UNIT/ML KiwkPen Commonly known as: HumaLOG KwikPen Use as directed if pump does not work What changed: Another medication with the same name was changed. Make sure you understand how and when to take each.   HumaLOG 100 UNIT/ML injection Generic drug: insulin lispro USE IN INSULIN PUMP FOR A TOTAL OF 230 UNITS PER DAY What changed: See the new instructions.   loperamide 2 MG capsule Commonly known as: IMODIUM Take 6 mg by mouth 2 (two) times daily.   Magnesium Oxide 400 MG Caps daily.   multivitamin tablet Take 1 tablet by mouth daily.   pantoprazole 40 MG tablet Commonly known as: PROTONIX Take 1 tablet by mouth 2 (two) times daily.   ramipril 2.5 MG capsule Commonly known as: ALTACE Take 2.5 mg by mouth 2 (two) times daily. Further refills through PCP.   simethicone 125 MG chewable tablet Commonly known as: MYLICON Chew 125 mg by mouth as needed for flatulence.   Vitamin D3 25 MCG (1000 UT) Caps Take 1,000 Units by mouth daily.        Allergies:  Allergies  Allergen Reactions   Aspartame Other (See Comments)    shaking   Clarithromycin Rash   Codeine Nausea And Vomiting   Mesalamine Other (See Comments)    "Sees things"   Rofecoxib Hives and Other (See Comments)    Hallucinations   Sulfasalazine Rash and Other (See Comments)    fever   Fish Oil Cough   Losartan Cough   Lyumjev [Insulin Lispro] Rash   Sulfa Antibiotics Rash and Other (See Comments)    fever    Sulfonamide Derivatives Rash and Other (See Comments)    fever    Past Medical History, Surgical history, Social history,  and Family History were reviewed and updated.  Review of Systems: Review of Systems  Constitutional:  Positive for malaise/fatigue.  HENT: Negative.    Eyes: Negative.   Respiratory: Negative.    Cardiovascular: Negative.   Gastrointestinal: Negative.   Genitourinary: Negative.   Musculoskeletal: Negative.   Skin: Negative.   Neurological: Negative.   Endo/Heme/Allergies: Negative.   Psychiatric/Behavioral: Negative.       Physical Exam:  height is 5\' 2"  (1.575 m) and weight is 164 lb 1.6 oz (74.4  kg). Her oral temperature is 98 F (36.7 C). Her blood pressure is 141/81 (abnormal) and her pulse is 85. Her respiration is 18 and oxygen saturation is 100%.   Wt Readings from Last 3 Encounters:  08/14/23 164 lb 1.6 oz (74.4 kg)  07/02/23 163 lb (73.9 kg)  04/24/23 160 lb (72.6 kg)    Physical Exam Vitals reviewed.  Constitutional:      Comments: Her breast exam shows right breast no masses, edema or erythema.  There is no right axillary adenopathy.  Left breast shows the lumpectomy scar at about the 2 o'clock position.  This is well-healed.  There is some slight contraction of the left breast from radiation.  She does have some erythema under the left breast.  She has a well-healed lumpectomy scar.  There is no fullness in the left axilla.  HENT:     Head: Normocephalic and atraumatic.  Eyes:     Pupils: Pupils are equal, round, and reactive to light.  Cardiovascular:     Rate and Rhythm: Normal rate and regular rhythm.     Heart sounds: Normal heart sounds.  Pulmonary:     Effort: Pulmonary effort is normal.     Breath sounds: Normal breath sounds.  Abdominal:     General: Bowel sounds are normal.     Palpations: Abdomen is soft.  Musculoskeletal:        General: No tenderness or deformity. Normal range of motion.     Cervical back: Normal range of motion.  Lymphadenopathy:     Cervical: No cervical adenopathy.  Skin:    General: Skin is warm and dry.     Findings:  No erythema or rash.  Neurological:     Mental Status: She is alert and oriented to person, place, and time.  Psychiatric:        Behavior: Behavior normal.        Thought Content: Thought content normal.        Judgment: Judgment normal.      Lab Results  Component Value Date   WBC 5.3 08/14/2023   HGB 10.3 (L) 08/14/2023   HCT 33.4 (L) 08/14/2023   MCV 87.9 08/14/2023   PLT 193 08/14/2023   Lab Results  Component Value Date   FERRITIN 11 07/02/2023   IRON 38 07/02/2023   TIBC 574 (H) 07/02/2023   UIBC 536 (H) 07/02/2023   IRONPCTSAT 7 (L) 07/02/2023   Lab Results  Component Value Date   RETICCTPCT 2.0 08/14/2023   RBC 3.76 (L) 08/14/2023   No results found for: "KPAFRELGTCHN", "LAMBDASER", "KAPLAMBRATIO" No results found for: "IGGSERUM", "IGA", "IGMSERUM" No results found for: "TOTALPROTELP", "ALBUMINELP", "A1GS", "A2GS", "BETS", "BETA2SER", "GAMS", "MSPIKE", "SPEI"   Chemistry      Component Value Date/Time   NA 141 07/02/2023 1205   K 3.9 07/02/2023 1205   CL 104 07/02/2023 1205   CO2 30 07/02/2023 1205   BUN 15 07/02/2023 1205   CREATININE 0.76 07/02/2023 1205      Component Value Date/Time   CALCIUM 9.8 07/02/2023 1205   CALCIUM 9.7 04/08/2011 1053   ALKPHOS 97 07/02/2023 1205   AST 22 07/02/2023 1205   ALT 15 07/02/2023 1205   BILITOT 0.6 07/02/2023 1205      Impression and Plan: Rhonda Gutierrez is a very pleasant 62 yo caucasian female with 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  Her recent problem is the early stage-stage I-infiltrating ductal carcinoma of the left breast.  This was found incidentally.  She underwent a lumpectomy and had radiation therapy.  All this was completed in April 2021.  She is on Arimidex.  We will give her Aranesp today.  I really think she would benefit from Aranesp.  I suspect her iron is probably still going on the low side.  We will see what that looks like.  Hopefully, the breast  mammogram will not show [place] anything that would be suspicious of malignancy.  We will plan to get her back to see [place] in another 6 weeks or so.    Josph Macho, MD 12/13/20241:06 PM

## 2023-08-14 NOTE — Telephone Encounter (Signed)
Advised via MyChart.

## 2023-08-21 ENCOUNTER — Inpatient Hospital Stay: Payer: Medicare Other

## 2023-08-21 VITALS — BP 114/65 | HR 85 | Temp 97.8°F | Resp 18

## 2023-08-21 DIAGNOSIS — D508 Other iron deficiency anemias: Secondary | ICD-10-CM

## 2023-08-21 DIAGNOSIS — C50912 Malignant neoplasm of unspecified site of left female breast: Secondary | ICD-10-CM | POA: Diagnosis not present

## 2023-08-21 MED ORDER — SODIUM CHLORIDE 0.9 % IV SOLN: Freq: Once | INTRAVENOUS | Status: AC

## 2023-08-21 MED ORDER — SODIUM CHLORIDE 0.9 % IV SOLN
125.0000 mg | Freq: Once | INTRAVENOUS | Status: AC
  Administered 2023-08-21: 125 mg via INTRAVENOUS
  Filled 2023-08-21: qty 125

## 2023-08-21 NOTE — Progress Notes (Signed)
Patient refused to wait 30 minutes post infusion. Released stable and ASX. 

## 2023-09-03 ENCOUNTER — Other Ambulatory Visit: Payer: Self-pay | Admitting: Hematology & Oncology

## 2023-09-17 ENCOUNTER — Encounter: Payer: Self-pay | Admitting: Family

## 2023-09-25 ENCOUNTER — Inpatient Hospital Stay (HOSPITAL_BASED_OUTPATIENT_CLINIC_OR_DEPARTMENT_OTHER): Payer: Medicare Other | Admitting: Hematology & Oncology

## 2023-09-25 ENCOUNTER — Encounter: Payer: Self-pay | Admitting: Hematology & Oncology

## 2023-09-25 ENCOUNTER — Inpatient Hospital Stay: Payer: Medicare Other

## 2023-09-25 ENCOUNTER — Inpatient Hospital Stay: Payer: Medicare Other | Attending: Hematology & Oncology

## 2023-09-25 VITALS — BP 93/68 | HR 81 | Temp 98.2°F | Resp 18 | Ht 62.0 in | Wt 171.0 lb

## 2023-09-25 DIAGNOSIS — K909 Intestinal malabsorption, unspecified: Secondary | ICD-10-CM | POA: Diagnosis not present

## 2023-09-25 DIAGNOSIS — M25511 Pain in right shoulder: Secondary | ICD-10-CM | POA: Insufficient documentation

## 2023-09-25 DIAGNOSIS — D508 Other iron deficiency anemias: Secondary | ICD-10-CM

## 2023-09-25 DIAGNOSIS — D500 Iron deficiency anemia secondary to blood loss (chronic): Secondary | ICD-10-CM

## 2023-09-25 DIAGNOSIS — K2970 Gastritis, unspecified, without bleeding: Secondary | ICD-10-CM | POA: Diagnosis not present

## 2023-09-25 DIAGNOSIS — C50912 Malignant neoplasm of unspecified site of left female breast: Secondary | ICD-10-CM | POA: Insufficient documentation

## 2023-09-25 DIAGNOSIS — N182 Chronic kidney disease, stage 2 (mild): Secondary | ICD-10-CM | POA: Diagnosis not present

## 2023-09-25 DIAGNOSIS — D631 Anemia in chronic kidney disease: Secondary | ICD-10-CM | POA: Insufficient documentation

## 2023-09-25 DIAGNOSIS — Z170 Estrogen receptor positive status [ER+]: Secondary | ICD-10-CM | POA: Insufficient documentation

## 2023-09-25 DIAGNOSIS — Z79811 Long term (current) use of aromatase inhibitors: Secondary | ICD-10-CM | POA: Insufficient documentation

## 2023-09-25 LAB — CMP (CANCER CENTER ONLY)
ALT: 14 U/L (ref 0–44)
AST: 23 U/L (ref 15–41)
Albumin: 4.4 g/dL (ref 3.5–5.0)
Alkaline Phosphatase: 89 U/L (ref 38–126)
Anion gap: 9 (ref 5–15)
BUN: 22 mg/dL — ABNORMAL HIGH (ref 6–20)
CO2: 28 mmol/L (ref 22–32)
Calcium: 9.9 mg/dL (ref 8.9–10.3)
Chloride: 104 mmol/L (ref 98–111)
Creatinine: 1.27 mg/dL — ABNORMAL HIGH (ref 0.44–1.00)
GFR, Estimated: 48 mL/min — ABNORMAL LOW (ref >60)
Glucose, Bld: 178 mg/dL — ABNORMAL HIGH (ref 70–99)
Potassium: 4.3 mmol/L (ref 3.5–5.1)
Sodium: 141 mmol/L (ref 135–145)
Total Bilirubin: 0.5 mg/dL (ref 0.0–1.2)
Total Protein: 7.3 g/dL (ref 6.5–8.1)

## 2023-09-25 LAB — RETICULOCYTES
Immature Retic Fract: 15.1 % (ref 2.3–15.9)
RBC.: 3.62 MIL/uL — ABNORMAL LOW (ref 3.87–5.11)
Retic Count, Absolute: 73.1 10*3/uL (ref 19.0–186.0)
Retic Ct Pct: 2 % (ref 0.4–3.1)

## 2023-09-25 LAB — CBC WITH DIFFERENTIAL (CANCER CENTER ONLY)
Abs Immature Granulocytes: 0.03 10*3/uL (ref 0.00–0.07)
Basophils Absolute: 0 10*3/uL (ref 0.0–0.1)
Basophils Relative: 1 %
Eosinophils Absolute: 0.3 10*3/uL (ref 0.0–0.5)
Eosinophils Relative: 4 %
HCT: 31.4 % — ABNORMAL LOW (ref 36.0–46.0)
Hemoglobin: 9.8 g/dL — ABNORMAL LOW (ref 12.0–15.0)
Immature Granulocytes: 0 %
Lymphocytes Relative: 20 %
Lymphs Abs: 1.4 10*3/uL (ref 0.7–4.0)
MCH: 27.3 pg (ref 26.0–34.0)
MCHC: 31.2 g/dL (ref 30.0–36.0)
MCV: 87.5 fL (ref 80.0–100.0)
Monocytes Absolute: 0.4 10*3/uL (ref 0.1–1.0)
Monocytes Relative: 5 %
Neutro Abs: 4.8 10*3/uL (ref 1.7–7.7)
Neutrophils Relative %: 70 %
Platelet Count: 224 10*3/uL (ref 150–400)
RBC: 3.59 MIL/uL — ABNORMAL LOW (ref 3.87–5.11)
RDW: 14.5 % (ref 11.5–15.5)
WBC Count: 7 10*3/uL (ref 4.0–10.5)
nRBC: 0 % (ref 0.0–0.2)

## 2023-09-25 LAB — FERRITIN: Ferritin: 18 ng/mL (ref 11–307)

## 2023-09-25 MED ORDER — DARBEPOETIN ALFA 300 MCG/0.6ML IJ SOSY
300.0000 ug | PREFILLED_SYRINGE | Freq: Once | INTRAMUSCULAR | Status: AC
Start: 2023-09-25 — End: 2023-09-25
  Administered 2023-09-25: 300 ug via SUBCUTANEOUS
  Filled 2023-09-25: qty 0.6

## 2023-09-25 NOTE — Progress Notes (Signed)
Hematology and Oncology Follow Up Visit  Rhonda Gutierrez [id number] [birth year] 63 y.o. 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 daily   Interim History:  Rhonda Gutierrez is here today for follow-up.  It seems like she will be going off to [place] in March.  She has to get her visa all arranged.  It sounds like the government is moving this along pretty quickly.  She is complaining of pain in the right shoulder.  Apparently she fell.  She does some damage to the shoulder.  She is seeing an orthopedist.  I think she is going do some physical therapy for this.  Her blood sugars have still been on the high side.  Again, she is trying to manage this.  She has had no obvious bleeding.  There is been no change in bowel or bladder habits.  She has had no cough.  Thankfully, there is been no problems with COVID.  She has had no rashes.  There is been no leg swelling.  She is doing well on the Arimidex.  Her last iron studies that were done back in December showed a ferritin of 59 with an iron saturation of 15%.   Overall, I would say performance status is probably ECOG 1.    Medications:  Allergies as of 09/25/2023       Reactions   Aspartame Other (See Comments)   shaking   Clarithromycin Rash   Codeine Nausea And Vomiting   Mesalamine Other (See Comments)   "Sees things"   Rofecoxib Hives, Other (See Comments)   Hallucinations   Sulfasalazine Rash, Other (See Comments)   fever   Fish Oil Cough   Losartan Cough   Lyumjev [insulin  Lispro] Rash   Sulfa Antibiotics Rash, Other (See Comments)   fever   Sulfonamide Derivatives Rash, Other (See Comments)   fever        Medication List        Accurate as of September 25, 2023  3:25 PM. If you have any questions, ask your nurse or doctor.          alendronate 70 MG tablet Commonly known as: FOSAMAX Take 70 mg by mouth once a week. Take with a full glass of water on an empty stomach.   ALPRAZolam 0.25 MG tablet Commonly known as: XANAX Take 0.25 mg by mouth 3 (three) times daily as needed.   anastrozole 1 MG tablet Commonly known as: ARIMIDEX Take 1 tablet (1 mg total) by mouth every morning.   aspirin 81 MG tablet Take 81 mg by mouth daily.   atorvastatin 80 MG tablet Commonly known as: LIPITOR Take 80 mg by mouth daily.   Baqsimi Two Pack 3 MG/DOSE Powd Generic drug: Glucagon Place into the nose.   calcium carbonate 500 MG chewable tablet Commonly known as: TUMS - dosed in mg elemental calcium Chew 3 tablets by mouth daily as needed.   calcium-vitamin D 500-200 MG-UNIT tablet Commonly known as: OSCAL WITH D Take 1 tablet by mouth 2 (two) times  daily.   celecoxib 100 MG capsule Commonly known as: CELEBREX Take 1 capsule by mouth twice daily   cetirizine 10 MG tablet Commonly known as: ZYRTEC Take 10 mg by mouth daily as needed.   clobetasol ointment 0.05 % Commonly known as: TEMOVATE Apply topically 2 (two) times daily.   cyanocobalamin 1000 MCG/ML injection Commonly known as: VITAMIN B12 Inject into the muscle every 30 (thirty) days.   desonide 0.05 % cream Commonly known as: DESOWEN Apply topically 2 (two) times daily.   escitalopram 20 MG tablet Commonly known as: LEXAPRO Take 20 mg by mouth daily.   famotidine 20 MG tablet Commonly known as: PEPCID Take 20 mg by mouth 2 (two) times daily.   fenofibrate 160 MG tablet Take 160 mg by mouth daily.   fluticasone 50 MCG/ACT nasal spray Commonly known as: FLONASE Place  into both nostrils daily.   folic acid 1 MG tablet Commonly known as: FOLVITE Take 1 mg by mouth daily.   gabapentin 600 MG tablet Commonly known as: NEURONTIN Take 600 mg by mouth 3 (three) times daily.   insulin lispro 100 UNIT/ML KiwkPen Commonly known as: HumaLOG KwikPen Use as directed if pump does not work What changed: Another medication with the same name was changed. Make sure you understand how and when to take each.   HumaLOG 100 UNIT/ML injection Generic drug: insulin lispro USE IN INSULIN PUMP FOR A TOTAL OF 230 UNITS PER DAY What changed: See the new instructions.   loperamide 2 MG capsule Commonly known as: IMODIUM Take 6 mg by mouth 2 (two) times daily.   Magnesium Oxide 400 MG Caps daily.   multivitamin tablet Take 1 tablet by mouth daily.   pantoprazole 40 MG tablet Commonly known as: PROTONIX Take 1 tablet by mouth 2 (two) times daily.   ramipril 2.5 MG capsule Commonly known as: ALTACE Take 2.5 mg by mouth 2 (two) times daily. Further refills through PCP.   simethicone 125 MG chewable tablet Commonly known as: MYLICON Chew 125 mg by mouth as needed for flatulence.   Vitamin D3 25 MCG (1000 UT) Caps Take 1,000 Units by mouth daily.        Allergies:  Allergies  Allergen Reactions   Aspartame Other (See Comments)    shaking   Clarithromycin Rash   Codeine Nausea And Vomiting   Mesalamine Other (See Comments)    "Sees things"   Rofecoxib Hives and Other (See Comments)    Hallucinations   Sulfasalazine Rash and Other (See Comments)    fever   Fish Oil Cough   Losartan Cough   Lyumjev [Insulin Lispro] Rash   Sulfa Antibiotics Rash and Other (See Comments)    fever    Sulfonamide Derivatives Rash and Other (See Comments)    fever    Past Medical History, Surgical history, Social history, and Family History were reviewed and updated.  Review of Systems: Review of Systems  Constitutional:  Positive for malaise/fatigue.  HENT:  Negative.    Eyes: Negative.   Respiratory: Negative.    Cardiovascular: Negative.   Gastrointestinal: Negative.   Genitourinary: Negative.   Musculoskeletal: Negative.   Skin: Negative.   Neurological: Negative.   Endo/Heme/Allergies: Negative.   Psychiatric/Behavioral: Negative.       Physical Exam:  height is 5\' 2"  (1.575 m) and weight is 171 lb (77.6 kg). Her oral temperature is 98.2 F (36.8 C). Her blood pressure is 93/68 and her pulse is 81. Her respiration is 18 and  oxygen saturation is 100%.   Wt Readings from Last 3 Encounters:  09/25/23 171 lb (77.6 kg)  08/14/23 164 lb 1.6 oz (74.4 kg)  07/02/23 163 lb (73.9 kg)    Physical Exam Vitals reviewed.  Constitutional:      Comments: Her breast exam shows right breast no masses, edema or erythema.  There is no right axillary adenopathy.  Left breast shows the lumpectomy scar at about the 2 o'clock position.  This is well-healed.  There is some slight contraction of the left breast from radiation.  She does have some erythema under the left breast.  She has a well-healed lumpectomy scar.  There is no fullness in the left axilla.  HENT:     Head: Normocephalic and atraumatic.  Eyes:     Pupils: Pupils are equal, round, and reactive to light.  Cardiovascular:     Rate and Rhythm: Normal rate and regular rhythm.     Heart sounds: Normal heart sounds.  Pulmonary:     Effort: Pulmonary effort is normal.     Breath sounds: Normal breath sounds.  Abdominal:     General: Bowel sounds are normal.     Palpations: Abdomen is soft.  Musculoskeletal:        General: No tenderness or deformity. Normal range of motion.     Cervical back: Normal range of motion.     Comments: She has a little bit of tenderness and decreased range of motion of the right shoulder.  Lymphadenopathy:     Cervical: No cervical adenopathy.  Skin:    General: Skin is warm and dry.     Findings: No erythema or rash.  Neurological:     Mental Status:  She is alert and oriented to person, place, and time.  Psychiatric:        Behavior: Behavior normal.        Thought Content: Thought content normal.        Judgment: Judgment normal.      Lab Results  Component Value Date   WBC 7.0 09/25/2023   HGB 9.8 (L) 09/25/2023   HCT 31.4 (L) 09/25/2023   MCV 87.5 09/25/2023   PLT 224 09/25/2023   Lab Results  Component Value Date   FERRITIN 59 08/14/2023   IRON 76 08/14/2023   TIBC 493 (H) 08/14/2023   UIBC 417 08/14/2023   IRONPCTSAT 15 08/14/2023   Lab Results  Component Value Date   RETICCTPCT 2.0 09/25/2023   RBC 3.59 (L) 09/25/2023   RBC 3.62 (L) 09/25/2023   No results found for: "KPAFRELGTCHN", "LAMBDASER", "KAPLAMBRATIO" No results found for: "IGGSERUM", "IGA", "IGMSERUM" No results found for: "TOTALPROTELP", "ALBUMINELP", "A1GS", "A2GS", "BETS", "BETA2SER", "GAMS", "MSPIKE", "SPEI"   Chemistry      Component Value Date/Time   NA 141 09/25/2023 1435   K 4.3 09/25/2023 1435   CL 104 09/25/2023 1435   CO2 28 09/25/2023 1435   BUN 22 (H) 09/25/2023 1435   CREATININE 1.27 (H) 09/25/2023 1435      Component Value Date/Time   CALCIUM 9.9 09/25/2023 1435   CALCIUM 9.7 04/08/2011 1053   ALKPHOS 89 09/25/2023 1435   AST 23 09/25/2023 1435   ALT 14 09/25/2023 1435   BILITOT 0.5 09/25/2023 1435      Impression and Plan: Ms. 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  Her recent problem is the early stage-stage I-infiltrating ductal carcinoma of the left  breast.  This was found incidentally.  She underwent a lumpectomy and had radiation therapy.  All this was completed in April 2021.  She is on Arimidex.  We will give her Aranesp today.  I really think she would benefit from Aranesp.  We will have to see what her iron levels show.  If she goes off to [place] in March, we will just plan to see her back whenever she returns.  I know she  comes back as she has a mother in a nursing home.  I know that she will have a good time and do well over in [place].   Josph Macho, MD 1/24/20253:25 PM

## 2023-09-25 NOTE — Patient Instructions (Signed)

## 2023-09-28 ENCOUNTER — Encounter: Payer: Self-pay | Admitting: Hematology & Oncology

## 2023-09-28 ENCOUNTER — Telehealth: Payer: Self-pay | Admitting: Hematology & Oncology

## 2023-09-28 LAB — IRON AND IRON BINDING CAPACITY (CC-WL,HP ONLY)
Iron: 50 ug/dL (ref 28–170)
Saturation Ratios: 9 % — ABNORMAL LOW (ref 10.4–31.8)
TIBC: 568 ug/dL — ABNORMAL HIGH (ref 250–450)
UIBC: 518 ug/dL — ABNORMAL HIGH (ref 148–442)

## 2023-09-28 NOTE — Telephone Encounter (Signed)
Called to schedule IV Iron per inbasket. LVM to return call for scheduling.

## 2023-10-07 ENCOUNTER — Ambulatory Visit: Payer: Medicare Other

## 2023-10-08 ENCOUNTER — Inpatient Hospital Stay: Payer: Medicare Other | Attending: Hematology & Oncology

## 2023-10-08 VITALS — BP 132/76 | HR 79 | Temp 97.8°F | Resp 18

## 2023-10-08 DIAGNOSIS — D631 Anemia in chronic kidney disease: Secondary | ICD-10-CM | POA: Diagnosis present

## 2023-10-08 DIAGNOSIS — N182 Chronic kidney disease, stage 2 (mild): Secondary | ICD-10-CM | POA: Insufficient documentation

## 2023-10-08 DIAGNOSIS — D508 Other iron deficiency anemias: Secondary | ICD-10-CM

## 2023-10-08 MED ORDER — SODIUM CHLORIDE 0.9 % IV SOLN
125.0000 mg | Freq: Once | INTRAVENOUS | Status: AC
  Administered 2023-10-08: 125 mg via INTRAVENOUS
  Filled 2023-10-08: qty 10

## 2023-10-08 MED ORDER — SODIUM CHLORIDE 0.9 % IV SOLN: Freq: Once | INTRAVENOUS | Status: AC

## 2024-02-17 ENCOUNTER — Encounter: Payer: Self-pay | Admitting: Hematology & Oncology

## 2024-03-14 ENCOUNTER — Other Ambulatory Visit: Payer: Self-pay

## 2024-03-14 DIAGNOSIS — D631 Anemia in chronic kidney disease: Secondary | ICD-10-CM

## 2024-03-15 ENCOUNTER — Encounter: Payer: Self-pay | Admitting: Hematology & Oncology

## 2024-03-15 ENCOUNTER — Inpatient Hospital Stay: Attending: Hematology & Oncology

## 2024-03-15 ENCOUNTER — Inpatient Hospital Stay: Admitting: Hematology & Oncology

## 2024-03-15 ENCOUNTER — Inpatient Hospital Stay

## 2024-03-15 ENCOUNTER — Ambulatory Visit: Payer: Self-pay | Admitting: Hematology & Oncology

## 2024-03-15 DIAGNOSIS — D631 Anemia in chronic kidney disease: Secondary | ICD-10-CM

## 2024-03-15 DIAGNOSIS — D509 Iron deficiency anemia, unspecified: Secondary | ICD-10-CM | POA: Diagnosis present

## 2024-03-15 LAB — CMP (CANCER CENTER ONLY)
ALT: 12 U/L (ref 0–44)
AST: 18 U/L (ref 15–41)
Albumin: 4.2 g/dL (ref 3.5–5.0)
Alkaline Phosphatase: 111 U/L (ref 38–126)
Anion gap: 8 (ref 5–15)
BUN: 19 mg/dL (ref 8–23)
CO2: 27 mmol/L (ref 22–32)
Calcium: 10.2 mg/dL (ref 8.9–10.3)
Chloride: 109 mmol/L (ref 98–111)
Creatinine: 0.85 mg/dL (ref 0.44–1.00)
GFR, Estimated: >60 mL/min (ref >60)
Glucose, Bld: 116 mg/dL — ABNORMAL HIGH (ref 70–99)
Potassium: 4.4 mmol/L (ref 3.5–5.1)
Sodium: 144 mmol/L (ref 135–145)
Total Bilirubin: 0.6 mg/dL (ref 0.0–1.2)
Total Protein: 7.5 g/dL (ref 6.5–8.1)

## 2024-03-15 LAB — CBC WITH DIFFERENTIAL (CANCER CENTER ONLY)
Abs Immature Granulocytes: 0.02 K/uL (ref 0.00–0.07)
Basophils Absolute: 0.1 K/uL (ref 0.0–0.1)
Basophils Relative: 1 %
Eosinophils Absolute: 0.6 K/uL — ABNORMAL HIGH (ref 0.0–0.5)
Eosinophils Relative: 11 %
HCT: 31.6 % — ABNORMAL LOW (ref 36.0–46.0)
Hemoglobin: 9.5 g/dL — ABNORMAL LOW (ref 12.0–15.0)
Immature Granulocytes: 0 %
Lymphocytes Relative: 18 %
Lymphs Abs: 1.1 K/uL (ref 0.7–4.0)
MCH: 24.2 pg — ABNORMAL LOW (ref 26.0–34.0)
MCHC: 30.1 g/dL (ref 30.0–36.0)
MCV: 80.4 fL (ref 80.0–100.0)
Monocytes Absolute: 0.5 K/uL (ref 0.1–1.0)
Monocytes Relative: 8 %
Neutro Abs: 3.7 K/uL (ref 1.7–7.7)
Neutrophils Relative %: 62 %
Platelet Count: 213 K/uL (ref 150–400)
RBC: 3.93 MIL/uL (ref 3.87–5.11)
RDW: 15.1 % (ref 11.5–15.5)
WBC Count: 6 K/uL (ref 4.0–10.5)
nRBC: 0 % (ref 0.0–0.2)

## 2024-03-15 LAB — IRON AND IRON BINDING CAPACITY (CC-WL,HP ONLY)
Iron: 39 ug/dL (ref 28–170)
Saturation Ratios: 7 % — ABNORMAL LOW (ref 10.4–31.8)
TIBC: 588 ug/dL — ABNORMAL HIGH (ref 250–450)
UIBC: 549 ug/dL

## 2024-03-15 LAB — RETICULOCYTES
Immature Retic Fract: 16.2 % — ABNORMAL HIGH (ref 2.3–15.9)
RBC.: 3.97 MIL/uL (ref 3.87–5.11)
Retic Count, Absolute: 63.1 K/uL (ref 19.0–186.0)
Retic Ct Pct: 1.6 % (ref 0.4–3.1)

## 2024-03-15 NOTE — Progress Notes (Unsigned)
 This encounter was created in error - please disregard.

## 2024-03-16 LAB — FERRITIN: Ferritin: 5 ng/mL — ABNORMAL LOW (ref 11–307)

## 2024-03-18 ENCOUNTER — Inpatient Hospital Stay

## 2024-03-18 VITALS — BP 143/61 | HR 71 | Temp 98.4°F

## 2024-03-18 DIAGNOSIS — D509 Iron deficiency anemia, unspecified: Secondary | ICD-10-CM | POA: Diagnosis not present

## 2024-03-18 DIAGNOSIS — D508 Other iron deficiency anemias: Secondary | ICD-10-CM

## 2024-03-18 MED ORDER — DARBEPOETIN ALFA 300 MCG/0.6ML IJ SOSY: 300.0000 ug | PREFILLED_SYRINGE | Freq: Once | INTRAMUSCULAR | Status: DC

## 2024-03-18 MED ORDER — SODIUM CHLORIDE 0.9 % IV SOLN: Freq: Once | INTRAVENOUS | Status: AC

## 2024-03-18 MED ORDER — SODIUM CHLORIDE 0.9 % IV SOLN
125.0000 mg | Freq: Once | INTRAVENOUS | Status: AC
  Administered 2024-03-18: 125 mg via INTRAVENOUS
  Filled 2024-03-18: qty 125

## 2024-03-18 NOTE — Patient Instructions (Signed)
Sodium Ferric Gluconate Complex Injection What is this medication? SODIUM FERRIC GLUCONATE COMPLEX (SOE dee um FER ik GLOO koe nate KOM pleks) treats low levels of iron (iron deficiency anemia) in people with kidney disease. Iron is a mineral that plays an important role in making red blood cells, which carry oxygen from your lungs to the rest of your body. This medicine may be used for other purposes; ask your health care provider or pharmacist if you have questions. COMMON BRAND NAME(S): Ferrlecit, Nulecit What should I tell my care team before I take this medication? They need to know if you have any of the following conditions: Anemia that is not from iron deficiency High levels of iron in the blood An unusual or allergic reaction to iron, other medications, foods, dyes, or preservatives Pregnant or are trying to become pregnant Breast-feeding How should I use this medication? This medication is injected into a vein. It is given by your care team in a hospital or clinic setting. Talk to your care team about the use of this medication in children. While it may be prescribed for children as young as 6 years for selected conditions, precautions do apply. Overdosage: If you think you have taken too much of this medicine contact a poison control center or emergency room at once. NOTE: This medicine is only for you. Do not share this medicine with others. What if I miss a dose? It is important not to miss your dose. Call your care team if you are unable to keep an appointment. What may interact with this medication? Do not take this medication with any of the following: Deferasirox Deferoxamine Dimercaprol This medication may also interact with the following: Other iron products This list may not describe all possible interactions. Give your health care provider a list of all the medicines, herbs, non-prescription drugs, or dietary supplements you use. Also tell them if you smoke, drink  alcohol, or use illegal drugs. Some items may interact with your medicine. What should I watch for while using this medication? Your condition will be monitored carefully while you are receiving this medication. Visit your care team for regular checks on your progress. You may need blood work while you are taking this medication. What side effects may I notice from receiving this medication? Side effects that you should report to your care team as soon as possible: Allergic reactions--skin rash, itching, hives, swelling of the face, lips, tongue, or throat Low blood pressure--dizziness, feeling faint or lightheaded, blurry vision Shortness of breath Side effects that usually do not require medical attention (report to your care team if they continue or are bothersome): Flushing Headache Joint pain Muscle pain Nausea Pain, redness, or irritation at injection site This list may not describe all possible side effects. Call your doctor for medical advice about side effects. You may report side effects to FDA at 1-800-FDA-1088. Where should I keep my medication? This medication is given in a hospital or clinic and will not be stored at home. NOTE: This sheet is a summary. It may not cover all possible information. If you have questions about this medicine, talk to your doctor, pharmacist, or health care provider.  2024 Elsevier/Gold Standard (2021-01-11 00:00:00)

## 2024-06-14 ENCOUNTER — Other Ambulatory Visit: Payer: Self-pay | Admitting: *Deleted

## 2024-06-14 DIAGNOSIS — D508 Other iron deficiency anemias: Secondary | ICD-10-CM

## 2024-06-15 ENCOUNTER — Encounter: Payer: Self-pay | Admitting: Hematology & Oncology

## 2024-06-15 ENCOUNTER — Inpatient Hospital Stay: Admitting: Hematology & Oncology

## 2024-06-15 ENCOUNTER — Inpatient Hospital Stay: Attending: Hematology & Oncology

## 2024-06-15 VITALS — BP 128/83 | HR 79 | Temp 98.8°F | Resp 18 | Wt 163.0 lb

## 2024-06-15 DIAGNOSIS — N182 Chronic kidney disease, stage 2 (mild): Secondary | ICD-10-CM | POA: Insufficient documentation

## 2024-06-15 DIAGNOSIS — Z79811 Long term (current) use of aromatase inhibitors: Secondary | ICD-10-CM | POA: Insufficient documentation

## 2024-06-15 DIAGNOSIS — C50912 Malignant neoplasm of unspecified site of left female breast: Secondary | ICD-10-CM | POA: Insufficient documentation

## 2024-06-15 DIAGNOSIS — K2940 Chronic atrophic gastritis without bleeding: Secondary | ICD-10-CM | POA: Diagnosis not present

## 2024-06-15 DIAGNOSIS — Z170 Estrogen receptor positive status [ER+]: Secondary | ICD-10-CM | POA: Insufficient documentation

## 2024-06-15 DIAGNOSIS — D631 Anemia in chronic kidney disease: Secondary | ICD-10-CM | POA: Diagnosis not present

## 2024-06-15 DIAGNOSIS — D509 Iron deficiency anemia, unspecified: Secondary | ICD-10-CM | POA: Diagnosis not present

## 2024-06-15 DIAGNOSIS — K909 Intestinal malabsorption, unspecified: Secondary | ICD-10-CM | POA: Insufficient documentation

## 2024-06-15 DIAGNOSIS — D508 Other iron deficiency anemias: Secondary | ICD-10-CM

## 2024-06-15 LAB — CBC WITH DIFFERENTIAL (CANCER CENTER ONLY)
Abs Immature Granulocytes: 0.07 K/uL (ref 0.00–0.07)
Basophils Absolute: 0.1 K/uL (ref 0.0–0.1)
Basophils Relative: 1 %
Eosinophils Absolute: 0.5 K/uL (ref 0.0–0.5)
Eosinophils Relative: 6 %
HCT: 32.7 % — ABNORMAL LOW (ref 36.0–46.0)
Hemoglobin: 10.2 g/dL — ABNORMAL LOW (ref 12.0–15.0)
Immature Granulocytes: 1 %
Lymphocytes Relative: 24 %
Lymphs Abs: 1.8 K/uL (ref 0.7–4.0)
MCH: 24.7 pg — ABNORMAL LOW (ref 26.0–34.0)
MCHC: 31.2 g/dL (ref 30.0–36.0)
MCV: 79.2 fL — ABNORMAL LOW (ref 80.0–100.0)
Monocytes Absolute: 0.6 K/uL (ref 0.1–1.0)
Monocytes Relative: 8 %
Neutro Abs: 4.6 K/uL (ref 1.7–7.7)
Neutrophils Relative %: 60 %
Platelet Count: 227 K/uL (ref 150–400)
RBC: 4.13 MIL/uL (ref 3.87–5.11)
RDW: 15 % (ref 11.5–15.5)
WBC Count: 7.6 K/uL (ref 4.0–10.5)
nRBC: 0 % (ref 0.0–0.2)

## 2024-06-15 LAB — CMP (CANCER CENTER ONLY)
ALT: 19 U/L (ref 0–44)
AST: 21 U/L (ref 15–41)
Albumin: 4.3 g/dL (ref 3.5–5.0)
Alkaline Phosphatase: 157 U/L — ABNORMAL HIGH (ref 38–126)
Anion gap: 10 (ref 5–15)
BUN: 22 mg/dL (ref 8–23)
CO2: 28 mmol/L (ref 22–32)
Calcium: 10.7 mg/dL — ABNORMAL HIGH (ref 8.9–10.3)
Chloride: 102 mmol/L (ref 98–111)
Creatinine: 0.93 mg/dL (ref 0.44–1.00)
GFR, Estimated: >60 mL/min (ref >60)
Glucose, Bld: 177 mg/dL — ABNORMAL HIGH (ref 70–99)
Potassium: 4.4 mmol/L (ref 3.5–5.1)
Sodium: 140 mmol/L (ref 135–145)
Total Bilirubin: 0.5 mg/dL (ref 0.0–1.2)
Total Protein: 7.5 g/dL (ref 6.5–8.1)

## 2024-06-15 LAB — IRON AND TIBC
Iron: 41 ug/dL (ref 28–170)
Saturation Ratios: 7 % — ABNORMAL LOW (ref 10.4–31.8)
TIBC: 615 ug/dL — ABNORMAL HIGH (ref 250–450)
UIBC: 574 ug/dL

## 2024-06-15 LAB — FERRITIN: Ferritin: 6 ng/mL — ABNORMAL LOW (ref 11–307)

## 2024-06-15 LAB — RETICULOCYTES
Immature Retic Fract: 20.3 % — ABNORMAL HIGH (ref 2.3–15.9)
RBC.: 4.14 MIL/uL (ref 3.87–5.11)
Retic Count, Absolute: 62.1 K/uL (ref 19.0–186.0)
Retic Ct Pct: 1.5 % (ref 0.4–3.1)

## 2024-06-15 NOTE — Progress Notes (Signed)
 This encounter was created in error - please disregard.

## 2024-06-15 NOTE — Progress Notes (Signed)
 Hematology and Oncology Follow Up Visit  Rhonda Gutierrez [id number] [birth year] 63 y.o. 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 daily   Interim History:  Rhonda Gutierrez is here today for follow-up.  She is now living over in [place].  She really enjoys it over there.  She is having a great time over there.  She enjoys the culture.  There is so much that she can do over there.  It is somewhat cheaper for her and her husband live.  She  had to come back in August because her mother passed away unexpectedly.  Her mom was 50 to years old but was doing quite well right up until she passed on.  She feels good.  She has had no problems with her blood sugars.  They have been a little bit on the higher side.  Hopefully, and should be able to get all this monitored over in [place].  She has had a good appetite.  She has had no nausea or vomiting.  Her last iron  studies that we did back in July showed a ferritin of only 5 with an iron  saturation of 7%.  We had to give her some IV iron .  It looks like her MCV is dropping again.  As such, I suspect she probably will need iron  again.  She has had no problems with the Arimidex .  She has had no hot flashes or sweats.  There is been no bony pain.  Overall, I would have to say that her performance status is probably ECOG 1.    Medications:  Allergies as of 06/15/2024       Reactions   Aspartame Other (See Comments)   shaking   Clarithromycin Rash   Codeine Nausea And Vomiting   Mesalamine Other  (See Comments)   Sees things   Rofecoxib Hives, Other (See Comments)   Hallucinations   Sulfasalazine Rash, Other (See Comments)   fever   Fish Oil Cough   Losartan  Cough   Lyumjev  [insulin  Lispro] Rash   Sulfa Antibiotics Rash, Other (See Comments)   fever   Sulfonamide Derivatives Rash, Other (See Comments)   fever        Medication List        Accurate as of June 15, 2024  3:32 PM. If you have any questions, ask your nurse or doctor.          alendronate 70 MG tablet Commonly known as: FOSAMAX Take 70 mg by mouth once a week. Take with a full glass of water on an empty stomach.   ALPRAZolam 0.25 MG tablet Commonly known as: XANAX Take 0.25 mg by mouth 3 (three) times daily as needed.   anastrozole  1 MG tablet Commonly known as: ARIMIDEX  Take 1 tablet (1 mg total) by mouth every morning.   aspirin  81 MG tablet Take 81 mg by mouth daily.   atorvastatin  80 MG tablet Commonly known as: LIPITOR  Take 80 mg by mouth daily.   AutoSoft XC Infusion Set Misc change daily; Duration: 90  days (pink) T-lock canula 6 ML   T:slim X2 3mL Cartridge Misc change daily; Duration: 90 days 9 boxes with 10 cartridges   Baqsimi  Two Pack 3 MG/DOSE Powd Generic drug: Glucagon  Place into the nose.   calcium  carbonate 500 MG chewable tablet Commonly known as: TUMS - dosed in mg elemental calcium  Chew 3 tablets by mouth daily as needed.   calcium -vitamin D  500-200 MG-UNIT tablet Commonly known as: OSCAL WITH D Take 1 tablet by mouth 2 (two) times daily.   cetirizine 10 MG tablet Commonly known as: ZYRTEC Take 10 mg by mouth daily as needed.   clobetasol ointment 0.05 % Commonly known as: TEMOVATE Apply topically 2 (two) times daily.   cyanocobalamin 1000 MCG/ML injection Commonly known as: VITAMIN B12 Inject into the muscle every 30 (thirty) days.   desonide 0.05 % cream Commonly known as: DESOWEN Apply topically 2 (two) times daily.   Dexcom G7 Sensor  Misc change every 10 days; Duration: 90 days   escitalopram  20 MG tablet Commonly known as: LEXAPRO  Take 20 mg by mouth daily.   famotidine  20 MG tablet Commonly known as: PEPCID  Take 20 mg by mouth 2 (two) times daily.   fenofibrate  160 MG tablet Take 160 mg by mouth daily.   fluticasone  50 MCG/ACT nasal spray Commonly known as: FLONASE  Place into both nostrils daily.   folic acid 1 MG tablet Commonly known as: FOLVITE Take 1 mg by mouth daily.   gabapentin  600 MG tablet Commonly known as: NEURONTIN  Take 600 mg by mouth 3 (three) times daily.   insulin  lispro 100 UNIT/ML KiwkPen Commonly known as: HumaLOG  KwikPen Use as directed if pump does not work   HumaLOG  100 UNIT/ML injection Generic drug: insulin  lispro USE IN INSULIN  PUMP FOR A TOTAL OF 230 UNITS PER DAY   loperamide 2 MG capsule Commonly known as: IMODIUM Take 6 mg by mouth 2 (two) times daily.   Magnesium Oxide 400 MG Caps daily.   metroNIDAZOLE 500 MG tablet Commonly known as: FLAGYL Take 500 mg by mouth 3 (three) times daily.   multivitamin tablet Take 1 tablet by mouth daily.   pantoprazole 40 MG tablet Commonly known as: PROTONIX Take 1 tablet by mouth 2 (two) times daily.   ramipril  2.5 MG capsule Commonly known as: ALTACE  Take 2.5 mg by mouth 2 (two) times daily. Further refills through PCP.   simethicone 125 MG chewable tablet Commonly known as: MYLICON Chew 125 mg by mouth as needed for flatulence.   traMADol 50 MG tablet Commonly known as: ULTRAM Take 50 mg by mouth 3 (three) times daily.   Vitamin D3 25 MCG (1000 UT) Caps Take 1,000 Units by mouth daily.        Allergies:  Allergies  Allergen Reactions   Aspartame Other (See Comments)    shaking   Clarithromycin Rash   Codeine Nausea And Vomiting   Mesalamine Other (See Comments)    Sees things   Rofecoxib Hives and Other (See Comments)    Hallucinations   Sulfasalazine Rash and Other (See Comments)    fever    Fish Oil Cough   Losartan  Cough   Lyumjev  [Insulin  Lispro] Rash   Sulfa Antibiotics Rash and Other (See Comments)    fever    Sulfonamide Derivatives Rash and Other (See Comments)    fever    Past Medical History, Surgical history, Social history, and Family History were reviewed and updated.  Review of Systems: Review of Systems  Constitutional:  Positive for malaise/fatigue.  HENT: Negative.    Eyes: Negative.   Respiratory: Negative.    Cardiovascular: Negative.   Gastrointestinal: Negative.   Genitourinary: Negative.   Musculoskeletal: Negative.   Skin: Negative.   Neurological: Negative.   Endo/Heme/Allergies: Negative.   Psychiatric/Behavioral: Negative.       Physical Exam:  weight is 163 lb (73.9 kg). Her oral temperature is 98.8 F (37.1 C). Her blood pressure is 128/83 and her pulse is 79. Her respiration is 18 and oxygen saturation is 99%.   Wt Readings from Last 3 Encounters:  06/15/24 163 lb (73.9 kg)  03/15/24 163 lb (73.9 kg)  09/25/23 171 lb (77.6 kg)    Physical Exam Vitals reviewed.  Constitutional:      Comments: Her breast exam shows right breast no masses, edema or erythema.  There is no right axillary adenopathy.  Left breast shows the lumpectomy scar at about the 2 o'clock position.  This is well-healed.  There is some slight contraction of the left breast from radiation.  She does have some erythema under the left breast.  She has a well-healed lumpectomy scar.  There is no fullness in the left axilla.  HENT:     Head: Normocephalic and atraumatic.  Eyes:     Pupils: Pupils are equal, round, and reactive to light.  Cardiovascular:     Rate and Rhythm: Normal rate and regular rhythm.     Heart sounds: Normal heart sounds.  Pulmonary:     Effort: Pulmonary effort is normal.     Breath sounds: Normal breath sounds.  Abdominal:     General: Bowel sounds are normal.     Palpations: Abdomen is soft.  Musculoskeletal:        General: No  tenderness or deformity. Normal range of motion.     Cervical back: Normal range of motion.     Comments: She has a little bit of tenderness and decreased range of motion of the right shoulder.  Lymphadenopathy:     Cervical: No cervical adenopathy.  Skin:    General: Skin is warm and dry.     Findings: No erythema or rash.  Neurological:     Mental Status: She is alert and oriented to person, place, and time.  Psychiatric:        Behavior: Behavior normal.        Thought Content: Thought content normal.        Judgment: Judgment normal.      Lab Results  Component Value Date   WBC 6.0 03/15/2024   HGB 9.5 (L) 03/15/2024   HCT 31.6 (L) 03/15/2024   MCV 80.4 03/15/2024   PLT 213 03/15/2024   Lab Results  Component Value Date   FERRITIN 5 (L) 03/15/2024   IRON  39 03/15/2024   TIBC 588 (H) 03/15/2024   UIBC 549 03/15/2024   IRONPCTSAT 7 (L) 03/15/2024   Lab Results  Component Value Date   RETICCTPCT 1.6 03/15/2024   RBC 3.93 03/15/2024   RBC 3.97 03/15/2024   No results found for: KPAFRELGTCHN, LAMBDASER, KAPLAMBRATIO No results found for: IGGSERUM, IGA, IGMSERUM No results found for: STEPHANY RINGS, A1GS, A2GS, EARLA JOANNIE KNIGHTS, MSPIKE, SPEI   Chemistry      Component Value Date/Time   NA 144 03/15/2024 1207   K 4.4 03/15/2024 1207   CL 109 03/15/2024 1207   CO2 27 03/15/2024 1207   BUN 19 03/15/2024 1207   CREATININE 0.85 03/15/2024 1207  Component Value Date/Time   CALCIUM  10.2 03/15/2024 1207   CALCIUM  9.7 04/08/2011 1053   ALKPHOS 111 03/15/2024 1207   AST 18 03/15/2024 1207   ALT 12 03/15/2024 1207   BILITOT 0.6 03/15/2024 1207      Impression and Plan: Ms. Eads is a very pleasant 63 yo caucasian female with iron  deficiency anemia secondary to autoimmune gastritis and malabsorption.    Her recent problem is the early stage-stage I-infiltrating ductal carcinoma of the left breast.  This was found  incidentally.  She underwent a lumpectomy and had radiation therapy.  All this was completed in April 2021.  She is on Arimidex .  She will come back on Friday.  We will have to give her IV iron  then.  I am sure that her iron  is on the low side.  She we will go back to [place] I think next week.  She is not coming back probably for another several months.  We will plan to get her back to see us  in another 6 months.  Maude JONELLE Crease, MD 10/15/20253:32 PM

## 2024-06-16 ENCOUNTER — Ambulatory Visit: Payer: Self-pay | Admitting: Hematology & Oncology

## 2024-06-17 ENCOUNTER — Inpatient Hospital Stay

## 2024-06-17 VITALS — BP 129/66 | HR 64 | Temp 98.2°F | Resp 18

## 2024-06-17 DIAGNOSIS — N182 Chronic kidney disease, stage 2 (mild): Secondary | ICD-10-CM | POA: Diagnosis not present

## 2024-06-17 DIAGNOSIS — D508 Other iron deficiency anemias: Secondary | ICD-10-CM

## 2024-06-17 MED ORDER — SODIUM CHLORIDE 0.9 % IV SOLN: Freq: Once | INTRAVENOUS | Status: AC

## 2024-06-17 MED ORDER — DARBEPOETIN ALFA 300 MCG/0.6ML IJ SOSY: 300.0000 ug | PREFILLED_SYRINGE | Freq: Once | INTRAMUSCULAR | Status: DC

## 2024-06-17 MED ORDER — SODIUM CHLORIDE 0.9 % IV SOLN
125.0000 mg | Freq: Once | INTRAVENOUS | Status: AC
  Administered 2024-06-17: 125 mg via INTRAVENOUS
  Filled 2024-06-17: qty 10

## 2024-06-17 NOTE — Progress Notes (Signed)
Patient refused to wait 30 minutes post infusion. Released stable and ASX. 

## 2024-06-17 NOTE — Patient Instructions (Signed)
Sodium Ferric Gluconate Complex Injection What is this medication? SODIUM FERRIC GLUCONATE COMPLEX (SOE dee um FER ik GLOO koe nate KOM pleks) treats low levels of iron (iron deficiency anemia) in people with kidney disease. Iron is a mineral that plays an important role in making red blood cells, which carry oxygen from your lungs to the rest of your body. This medicine may be used for other purposes; ask your health care provider or pharmacist if you have questions. COMMON BRAND NAME(S): Ferrlecit, Nulecit What should I tell my care team before I take this medication? They need to know if you have any of the following conditions: Anemia that is not from iron deficiency High levels of iron in the blood An unusual or allergic reaction to iron, other medications, foods, dyes, or preservatives Pregnant or are trying to become pregnant Breast-feeding How should I use this medication? This medication is injected into a vein. It is given by your care team in a hospital or clinic setting. Talk to your care team about the use of this medication in children. While it may be prescribed for children as young as 6 years for selected conditions, precautions do apply. Overdosage: If you think you have taken too much of this medicine contact a poison control center or emergency room at once. NOTE: This medicine is only for you. Do not share this medicine with others. What if I miss a dose? It is important not to miss your dose. Call your care team if you are unable to keep an appointment. What may interact with this medication? Do not take this medication with any of the following: Deferasirox Deferoxamine Dimercaprol This medication may also interact with the following: Other iron products This list may not describe all possible interactions. Give your health care provider a list of all the medicines, herbs, non-prescription drugs, or dietary supplements you use. Also tell them if you smoke, drink  alcohol, or use illegal drugs. Some items may interact with your medicine. What should I watch for while using this medication? Your condition will be monitored carefully while you are receiving this medication. Visit your care team for regular checks on your progress. You may need blood work while you are taking this medication. What side effects may I notice from receiving this medication? Side effects that you should report to your care team as soon as possible: Allergic reactions--skin rash, itching, hives, swelling of the face, lips, tongue, or throat Low blood pressure--dizziness, feeling faint or lightheaded, blurry vision Shortness of breath Side effects that usually do not require medical attention (report to your care team if they continue or are bothersome): Flushing Headache Joint pain Muscle pain Nausea Pain, redness, or irritation at injection site This list may not describe all possible side effects. Call your doctor for medical advice about side effects. You may report side effects to FDA at 1-800-FDA-1088. Where should I keep my medication? This medication is given in a hospital or clinic and will not be stored at home. NOTE: This sheet is a summary. It may not cover all possible information. If you have questions about this medicine, talk to your doctor, pharmacist, or health care provider.  2024 Elsevier/Gold Standard (2021-01-11 00:00:00)

## 2024-09-16 ENCOUNTER — Encounter: Payer: Self-pay | Admitting: Family

## 2024-09-24 ENCOUNTER — Other Ambulatory Visit: Payer: Self-pay | Admitting: Hematology & Oncology

## 2024-10-02 ENCOUNTER — Encounter: Payer: Self-pay | Admitting: Hematology & Oncology

## 2024-12-22 ENCOUNTER — Inpatient Hospital Stay: Attending: Hematology & Oncology

## 2024-12-22 ENCOUNTER — Inpatient Hospital Stay: Admitting: Hematology & Oncology
